# Patient Record
Sex: Female | Born: 1960 | Race: White | Hispanic: No | Marital: Married | Smoking: Former smoker
Health system: Southern US, Community
[De-identification: ages and names within clinical notes are randomized; demographics above are authoritative.]

## PROBLEM LIST (undated history)

## (undated) DIAGNOSIS — K589 Irritable bowel syndrome without diarrhea: Secondary | ICD-10-CM

## (undated) DIAGNOSIS — M47812 Spondylosis without myelopathy or radiculopathy, cervical region: Secondary | ICD-10-CM

## (undated) DIAGNOSIS — N200 Calculus of kidney: Secondary | ICD-10-CM

## (undated) DIAGNOSIS — Z9889 Other specified postprocedural states: Secondary | ICD-10-CM

## (undated) DIAGNOSIS — R112 Nausea with vomiting, unspecified: Secondary | ICD-10-CM

## (undated) DIAGNOSIS — M47816 Spondylosis without myelopathy or radiculopathy, lumbar region: Secondary | ICD-10-CM

## (undated) DIAGNOSIS — L988 Other specified disorders of the skin and subcutaneous tissue: Secondary | ICD-10-CM

## (undated) DIAGNOSIS — I10 Essential (primary) hypertension: Secondary | ICD-10-CM

## (undated) DIAGNOSIS — F329 Major depressive disorder, single episode, unspecified: Secondary | ICD-10-CM

## (undated) DIAGNOSIS — F32A Depression, unspecified: Secondary | ICD-10-CM

## (undated) DIAGNOSIS — K52832 Lymphocytic colitis: Secondary | ICD-10-CM

## (undated) DIAGNOSIS — M1990 Unspecified osteoarthritis, unspecified site: Secondary | ICD-10-CM

## (undated) DIAGNOSIS — R51 Headache: Secondary | ICD-10-CM

## (undated) DIAGNOSIS — N189 Chronic kidney disease, unspecified: Secondary | ICD-10-CM

## (undated) HISTORY — DX: Other specified disorders of the skin and subcutaneous tissue: L98.8

## (undated) HISTORY — DX: Spondylosis without myelopathy or radiculopathy, lumbar region: M47.816

## (undated) HISTORY — DX: Irritable bowel syndrome, unspecified: K58.9

## (undated) HISTORY — PX: DG MYLEOGRAM LUMBAR SPINE (ARMC HX): HXRAD1576

## (undated) HISTORY — PX: OTHER SURGICAL HISTORY: SHX169

## (undated) HISTORY — PX: CERVICAL FUSION: SHX112

## (undated) HISTORY — DX: Essential (primary) hypertension: I10

## (undated) HISTORY — DX: Unspecified osteoarthritis, unspecified site: M19.90

## (undated) HISTORY — DX: Depression, unspecified: F32.A

## (undated) HISTORY — DX: Spondylosis without myelopathy or radiculopathy, cervical region: M47.812

## (undated) HISTORY — DX: Major depressive disorder, single episode, unspecified: F32.9

## (undated) HISTORY — DX: Lymphocytic colitis: K52.832

## (undated) HISTORY — PX: TUBAL LIGATION: SHX77

## (undated) HISTORY — PX: LUMBAR DISC SURGERY: SHX700

---

## 1997-07-21 ENCOUNTER — Encounter: Admission: RE | Admit: 1997-07-21 | Discharge: 1997-10-19 | Payer: Self-pay | Admitting: Anesthesiology

## 1997-10-28 ENCOUNTER — Encounter: Admission: RE | Admit: 1997-10-28 | Discharge: 1998-01-26 | Payer: Self-pay | Admitting: Anesthesiology

## 1998-01-12 ENCOUNTER — Ambulatory Visit (HOSPITAL_COMMUNITY): Admission: RE | Admit: 1998-01-12 | Discharge: 1998-01-12 | Payer: Self-pay | Admitting: Neurological Surgery

## 1998-01-12 ENCOUNTER — Encounter: Payer: Self-pay | Admitting: Neurological Surgery

## 1998-01-23 ENCOUNTER — Encounter: Payer: Self-pay | Admitting: Neurological Surgery

## 1998-01-23 ENCOUNTER — Ambulatory Visit (HOSPITAL_COMMUNITY): Admission: RE | Admit: 1998-01-23 | Discharge: 1998-01-23 | Payer: Self-pay | Admitting: Neurological Surgery

## 1998-01-29 ENCOUNTER — Encounter: Admission: RE | Admit: 1998-01-29 | Discharge: 1998-04-17 | Payer: Self-pay | Admitting: Anesthesiology

## 1998-04-07 ENCOUNTER — Other Ambulatory Visit: Admission: RE | Admit: 1998-04-07 | Discharge: 1998-04-07 | Payer: Self-pay | Admitting: Obstetrics and Gynecology

## 1998-04-17 ENCOUNTER — Encounter: Admission: RE | Admit: 1998-04-17 | Discharge: 1998-07-16 | Payer: Self-pay | Admitting: Diagnostic Radiology

## 1998-05-20 ENCOUNTER — Encounter: Admission: RE | Admit: 1998-05-20 | Discharge: 1998-08-18 | Payer: Self-pay | Admitting: Anesthesiology

## 1998-07-20 ENCOUNTER — Encounter: Admission: RE | Admit: 1998-07-20 | Discharge: 1998-10-13 | Payer: Self-pay | Admitting: Anesthesiology

## 1998-10-13 ENCOUNTER — Encounter: Admission: RE | Admit: 1998-10-13 | Discharge: 1999-01-08 | Payer: Self-pay | Admitting: Anesthesiology

## 1998-10-29 ENCOUNTER — Encounter: Payer: Self-pay | Admitting: Neurological Surgery

## 1998-10-29 ENCOUNTER — Ambulatory Visit (HOSPITAL_COMMUNITY): Admission: RE | Admit: 1998-10-29 | Discharge: 1998-10-29 | Payer: Self-pay | Admitting: Neurological Surgery

## 1999-01-08 ENCOUNTER — Encounter: Admission: RE | Admit: 1999-01-08 | Discharge: 1999-04-08 | Payer: Self-pay | Admitting: Anesthesiology

## 1999-04-13 ENCOUNTER — Encounter: Admission: RE | Admit: 1999-04-13 | Discharge: 1999-06-30 | Payer: Self-pay | Admitting: Anesthesiology

## 1999-07-19 ENCOUNTER — Encounter: Admission: RE | Admit: 1999-07-19 | Discharge: 1999-10-17 | Payer: Self-pay | Admitting: Anesthesiology

## 1999-10-12 ENCOUNTER — Encounter: Admission: RE | Admit: 1999-10-12 | Discharge: 2000-01-10 | Payer: Self-pay | Admitting: Anesthesiology

## 1999-10-19 ENCOUNTER — Encounter: Payer: Self-pay | Admitting: Anesthesiology

## 1999-10-19 ENCOUNTER — Ambulatory Visit (HOSPITAL_COMMUNITY): Admission: RE | Admit: 1999-10-19 | Discharge: 1999-10-19 | Payer: Self-pay | Admitting: Anesthesiology

## 1999-11-10 ENCOUNTER — Encounter: Payer: Self-pay | Admitting: Emergency Medicine

## 1999-11-10 ENCOUNTER — Emergency Department (HOSPITAL_COMMUNITY): Admission: EM | Admit: 1999-11-10 | Discharge: 1999-11-10 | Payer: Self-pay | Admitting: Emergency Medicine

## 2000-01-10 ENCOUNTER — Encounter: Admission: RE | Admit: 2000-01-10 | Discharge: 2000-04-09 | Payer: Self-pay | Admitting: Anesthesiology

## 2000-04-04 ENCOUNTER — Other Ambulatory Visit: Admission: RE | Admit: 2000-04-04 | Discharge: 2000-04-04 | Payer: Self-pay | Admitting: Obstetrics and Gynecology

## 2000-04-18 ENCOUNTER — Encounter: Admission: RE | Admit: 2000-04-18 | Discharge: 2000-07-17 | Payer: Self-pay | Admitting: Anesthesiology

## 2000-04-27 ENCOUNTER — Encounter: Payer: Self-pay | Admitting: Anesthesiology

## 2000-04-27 ENCOUNTER — Ambulatory Visit (HOSPITAL_COMMUNITY): Admission: RE | Admit: 2000-04-27 | Discharge: 2000-04-27 | Payer: Self-pay | Admitting: Anesthesiology

## 2000-07-13 ENCOUNTER — Encounter: Admission: RE | Admit: 2000-07-13 | Discharge: 2000-10-07 | Payer: Self-pay | Admitting: Anesthesiology

## 2001-07-04 ENCOUNTER — Encounter: Payer: Self-pay | Admitting: Anesthesiology

## 2001-07-04 ENCOUNTER — Ambulatory Visit (HOSPITAL_COMMUNITY): Admission: RE | Admit: 2001-07-04 | Discharge: 2001-07-04 | Payer: Self-pay | Admitting: Anesthesiology

## 2002-10-11 ENCOUNTER — Encounter: Payer: Self-pay | Admitting: Anesthesiology

## 2002-10-11 ENCOUNTER — Ambulatory Visit (HOSPITAL_COMMUNITY): Admission: RE | Admit: 2002-10-11 | Discharge: 2002-10-11 | Payer: Self-pay | Admitting: Anesthesiology

## 2002-11-07 ENCOUNTER — Encounter: Admission: RE | Admit: 2002-11-07 | Discharge: 2002-11-07 | Payer: Self-pay | Admitting: Anesthesiology

## 2002-11-07 ENCOUNTER — Encounter: Payer: Self-pay | Admitting: Anesthesiology

## 2003-01-14 ENCOUNTER — Encounter: Admission: RE | Admit: 2003-01-14 | Discharge: 2003-01-14 | Payer: Self-pay | Admitting: Neurological Surgery

## 2003-02-11 HISTORY — PX: COLONOSCOPY: SHX174

## 2003-02-12 ENCOUNTER — Other Ambulatory Visit: Admission: RE | Admit: 2003-02-12 | Discharge: 2003-02-12 | Payer: Self-pay | Admitting: Obstetrics and Gynecology

## 2003-02-13 ENCOUNTER — Encounter: Admission: RE | Admit: 2003-02-13 | Discharge: 2003-02-13 | Payer: Self-pay | Admitting: Gastroenterology

## 2003-03-11 ENCOUNTER — Encounter: Admission: RE | Admit: 2003-03-11 | Discharge: 2003-04-03 | Payer: Self-pay | Admitting: Anesthesiology

## 2003-05-06 ENCOUNTER — Ambulatory Visit (HOSPITAL_COMMUNITY): Admission: RE | Admit: 2003-05-06 | Discharge: 2003-05-06 | Payer: Self-pay | Admitting: Family Medicine

## 2004-04-20 ENCOUNTER — Other Ambulatory Visit: Admission: RE | Admit: 2004-04-20 | Discharge: 2004-04-20 | Payer: Self-pay | Admitting: Obstetrics and Gynecology

## 2005-08-26 IMAGING — NM NM BONE MULTIPLE AREAS
14 series · 14 of 14 positions shown · non-contrast
Comparison: none

CLINICAL DATA: Neck and low back pain ? History of lumbar fusion.

[st statics, dual detec · 2.36mm/px · 1 of 1 slices shown (1 of 14)]
[im 1/1]
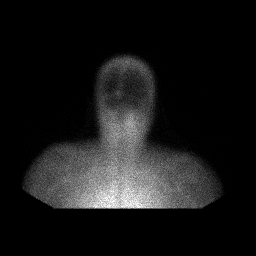

[st statics, dual detec · 2.36mm/px · 1 of 1 slices shown (2 of 14)]
[im 1/1]
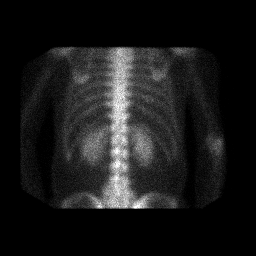

[st statics, dual detec · 2.33mm/px · 1 of 1 slices shown (3 of 14)]
[im 1/1]
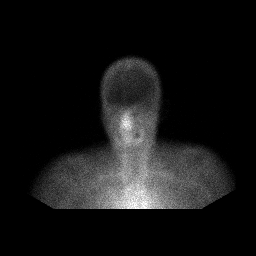

[st statics, dual detec · 2.33mm/px · 1 of 1 slices shown (4 of 14)]
[im 1/1]
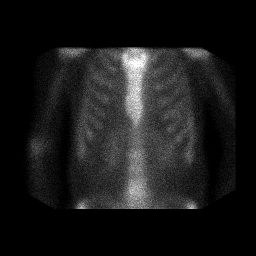

[st statics, dual detec · 2.36mm/px · 1 of 1 slices shown (5 of 14)]
[im 1/1]
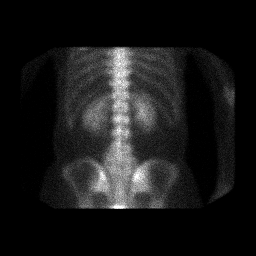

[st statics, dual detec · 2.33mm/px · 1 of 1 slices shown (6 of 14)]
[im 1/1]
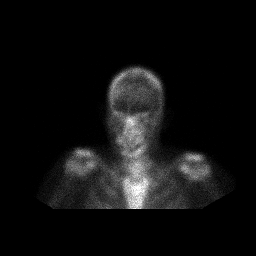

[st statics, dual detec · 2.33mm/px · 1 of 1 slices shown (7 of 14)]
[im 1/1]
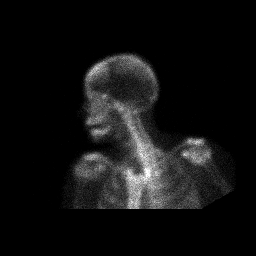

[st statics, dual detec · 2.33mm/px · 1 of 1 slices shown (8 of 14)]
[im 1/1]
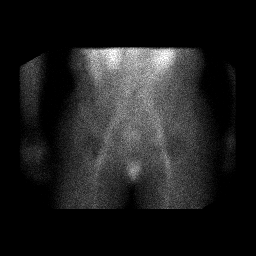

[st statics, dual detec · 2.36mm/px · 1 of 1 slices shown (9 of 14)]
[im 1/1]
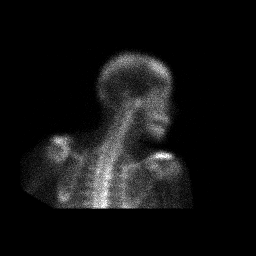

[st statics, dual detec · 2.36mm/px · 1 of 1 slices shown (10 of 14)]
[im 1/1]
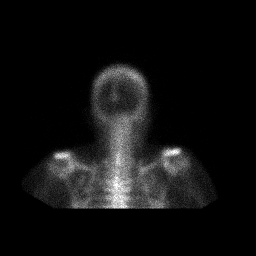

[st statics, dual detec · 2.36mm/px · 1 of 1 slices shown (11 of 14)]
[im 1/1]
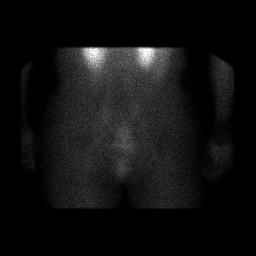

[st statics, dual detec · 2.33mm/px · 1 of 1 slices shown (12 of 14)]
[im 1/1]
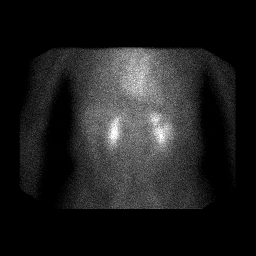

[st statics, dual detec · 2.33mm/px · 1 of 1 slices shown (13 of 14)]
[im 1/1]
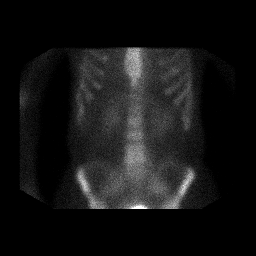

[st statics, dual detec · 2.36mm/px · 1 of 1 slices shown (14 of 14)]
[im 1/1]
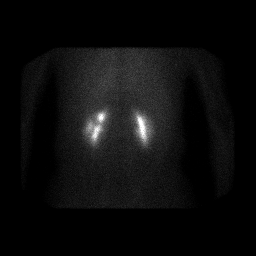

[14 of 14 positions shown; findings below may reference images not displayed]

NUCLEAR MEDICINE BONE SCAN 
 Early and delayed static images were obtained of the neck and shoulder regions and in the spine and torso region.  

 There is no abnormal uptake that would suggest significant osteoblastic reaction.  There is bilateral renal function without obstruction.

 IMPRESSION
 No pathological uptake in the cervical or lumbar regions.

 [REDACTED]

## 2006-02-02 ENCOUNTER — Ambulatory Visit (HOSPITAL_COMMUNITY): Admission: RE | Admit: 2006-02-02 | Discharge: 2006-02-02 | Payer: Self-pay | Admitting: Anesthesiology

## 2006-07-27 ENCOUNTER — Ambulatory Visit (HOSPITAL_COMMUNITY): Admission: RE | Admit: 2006-07-27 | Discharge: 2006-07-27 | Payer: Self-pay | Admitting: Anesthesiology

## 2007-07-08 ENCOUNTER — Encounter: Admission: RE | Admit: 2007-07-08 | Discharge: 2007-07-08 | Payer: Self-pay | Admitting: Orthopaedic Surgery

## 2007-12-24 ENCOUNTER — Encounter: Admission: RE | Admit: 2007-12-24 | Discharge: 2007-12-24 | Payer: Self-pay | Admitting: Orthopaedic Surgery

## 2009-08-31 ENCOUNTER — Encounter: Admission: RE | Admit: 2009-08-31 | Discharge: 2009-11-05 | Payer: Self-pay | Admitting: Orthopaedic Surgery

## 2009-12-30 ENCOUNTER — Encounter: Admission: RE | Admit: 2009-12-30 | Discharge: 2009-12-30 | Payer: Self-pay | Admitting: Obstetrics and Gynecology

## 2010-02-15 ENCOUNTER — Encounter: Payer: Self-pay | Admitting: Gastroenterology

## 2010-03-11 NOTE — Letter (Signed)
Summary: Colonoscopy Date Change Letter  [facility]  [address] [place], [place] [address]   Phone: [phone]  Fax: [phone]      February 15, 2010 MRN: [id number]   Amy Merritt [address] [facility] [address] [place], [place]  [address]   Dear Ms. CHAUCA,   Previously you were recommended to have a repeat colonoscopy around this time. Your chart was recently reviewed by Dr.Malcolm Russella Dar of [facility]. Follow up colonoscopy is now recommended in October 2014. This revised recommendation is based on current, nationally recognized guidelines for colorectal cancer screening and polyp surveillance. These guidelines are endorsed by the American Cancer Society, [employer] on Colorectal Cancer as well as numerous other major medical organizations.  Please understand that our recommendation assumes that you do not have any new symptoms such as bleeding, a change in bowel habits, anemia, or significant abdominal discomfort. If you do have any concerning GI symptoms or want to discuss the guideline recommendations, please call to arrange an office visit at your earliest convenience. Otherwise we will keep you in our reminder system and contact you 1-2 months prior to the date listed above to schedule your next colonoscopy.  Thank you,  Judie Petit T. Russella Dar, M.D.  [facility] [phone]

## 2010-06-25 NOTE — H&P (Signed)
[facility]  Patient:    Amy Merritt, Amy Merritt Visit Number: [id number] MRN: [id number]          Service Type: PMG Location: [facility] Attending Physician:  Thyra Breed Adm. Date:  [id number]   CC:         Dr. Christell Constant   History and Physical  FOLLOWUP EVALUATION  HISTORY OF PRESENT ILLNESS:  Jesiah is worked in today for followup.  She developed swelling of her lower extremities with a rash on the medial aspects of her ankles on Saturday.  She is concerned it may be coming form some of her medications.  She has reduced the amount of Duragesic she is using and as a result she is having increasing lower back discomfort.  She has no shortness of breath.  She denied fevers or sweats.  CURRENT MEDICATIONS: 1. Glucosamine complex. 2. Centrum. 3. Alprazolam 1 mg q.8-6h. 4. Tizanidine 4 mg 2 q.8h. 5. Paxil 20 mg 1 q.d. with Paxil 30 mg 1 q.d. 6. Naprosyn 500 mg 2 x q.d. 7. Neurontin 800 mg q.8h. 8. Duragesic currently with 100 mcg. 9. Roxicodone for breakthrough pain.  PHYSICAL EXAMINATION:  VITAL SIGNS:  Blood pressure 121/58, heart rate 74, respiratory rate 16.  O2 saturation 100%.  Pain level 7/10.  NEUROLOGIC:  She is tender over lower back to palpation.  She exhibits symmetric deep tendon reflexes at the knees and ankles.  Straight leg raise signs are negative.  Gait is antalgic.  She has 2+ pitting edema of the lower extremities with what appears to be perifollicular ruttish discoloration of the medial aspects of the legs, left greater than right.  She has an excoriation of both sides of her feet.  IMPRESSION: 1. Edema with rash, rule out possible leukocytoclastic vasculitis versus    contact dermatitis. 2. Low back pain on the basis of lumbar spondylosis. 3. Neck and shoulder pain on the basis of cervical spondylosis. 4. Depression. 5. Other medical problems per primary care physician.  DISPOSITION: 1. Stop Duragesic. 2. Roxicodone for  breakthrough pain. 3. OxyContin 80 mg 1 p.o. b.i.d.  She was given prescription for 60 of these.    She was aware of the side effects of this.  An alternative medication that    could be causing this could be the Paxil or the Zanaflex.  She is on    several medications which could potentially be causing this.  She is also    taking herbs.  She was encouraged to call later this week and to keep her    appointment in two weeks as previously scheduled. DD:  09/25/00 TD:  09/25/00 Job: [id number]

## 2010-06-25 NOTE — Consult Note (Signed)
[facility]  Patient:    EDISON, WOLLSCHLAGER                      MRN: [id number] Proc. Date: 12/13/99 Adm. Date:  [id number] Attending:  Thyra Breed CC:         Dr. Stefani Dama  Dr. Christell Constant, [place], [place]   Consultation Report  FOLLOWUP EVALUATION:  Delphia Grates comes in for followup evaluation of her chronic low back pain on the basis of lumbar spondylosis.  She states she is doing fairly well on her current medical regimen, with pain level 5/10.  She is back on the Ritalin in addition to her other previous medications which included Neurontin, Xanax, methadone, Percocet, Naprosyn and Paxil.  EXAMINATION  VITAL SIGNS:  Blood pressure 124/64.  Heart rate is 85.  Respiratory rate is 20.  O2 saturation is 96%.  Pain level is 5/10.  NEUROLOGIC:  Deep tendon reflexes were symmetric in the lower extremities with negative straight leg raise signs.  IMPRESSION 1. Low back pain on the basis of lumbar spondylosis. 2. Neck pain on the basis of cervical spondylosis. 3. Other medical problems per primary care physician.  DISPOSITION 1. Prescriptions were written for:    a. Percocet 5/325 mg 1 p.o. q.6h. p.r.n., #90 with no refill.    b. Neurontin 800 mg 1 p.o. q.8h., #100 with 2 refills.    c. Xanax 1 mg 1 p.o. q.6h. p.r.n., #120 with no refill.    d. Methadone 5 mg 2 p.o. q.8h., #180 with no refill.    e. Naprosyn 500 mg 1 p.o. b.i.d., #60 with 6 refills.    f. Paxil 20 mg 2 p.o. q.d., #60 with 3 refills. 2. Follow up with me in four to eight weeks. DD:  12/13/99 TD:  12/14/99 Job: [id number]

## 2010-06-25 NOTE — Procedures (Signed)
[facility]  Patient:    Amy Merritt, Amy Merritt                      MRN: [id number] Proc. Date: 03/21/00 Adm. Date:  [id number] Attending:  Thyra Breed CC:         Stefani Dama, M.D.  Monica Becton, M.D.   Procedure Report  ANESTHESIOLOGIST:  Thyra Breed, M.D.  HISTORY:  Tanija comes in for followup evaluation of her neck and lower back discomfort.  Since her last visit, for about two weeks, she has had severe right shoulder discomfort radiating up into the neck and out to the right forearm and down to her thumb.  It is exacerbated by use.  She is not feeling as good from the medications with regard to pain control.  She continues on the oxycodone but does not feel it lasts nearly six hours.  She continues on the methadone which she used previously as well as the Naprosyn, Neurontin, and Zanaflex.  She recalls a fall three to four weeks ago with her right arm extended and may have injured her shoulder at that time.  PHYSICAL EXAMINATION:  VITAL SIGNS:  Blood pressure 124/86, heart rate 70, respiratory rate 18, O2 saturation 98%, pain level 7/10.  MUSCULOSKELETAL:  The patient demonstrates a trigger point in the right levator scapulae and pain on adduction of her right shoulder from 60 to 120 degrees.  She has increased pain on resisted abduction and external rotation. Deep tendon reflexes were 2+ and symmetric in the upper and lower extremities with downgoing toes.  Motor was significant for breakthrough pain of her right upper extremity.  CURRENT MEDICATIONS: 1. Xanax 1 mg q.6h. 2. Oxycodone 15 mg q.6h. 3. Methadone 5 mg 2 p.o. q.8h. 4. Naprosyn 500 mg 1 b.i.d. 5. Ritalin 10 mg 2 q.8h. 6. Neurontin 800 mg q.8h. 7. Zanaflex 4 mg 2 p.o. q.8h. 8. Glucosamine. 9. Paxil.  DESCRIPTION OF PROCEDURE:  After informed consent was obtained, I prepped out her right shoulder and injected 3 cc of 1% lidocaine with 40 mg of Medrol via the  posterior approach.  I used a 22-gauge needle.  The patient tolerated this well and noted decrease in her pain.  I prepped out a trigger point over the right levator scapulae and injected this with 2 cc of 1% lidocaine using a 25-gauge needle after prep with Betadine x 3.  POSTPROCEDURE CONDITION:  The patient noted that her shoulder and neck discomfort were improved, but her lower back discomfort persists.  DISPOSITION: 1. Resume previous diet. 2. Limitation of activities per instruction sheet. 3. Continue on current medications. 4. Follow up with me in four weeks.  I wrote the prescriptions for methadone    5 mg 2 p.o. q.8h., #180 with no refill; Dilaudid 4 mg 1 p.o. q.4-6h.    p.r.n., #150 with no refill; Ritalin 10 mg 2 p.o. q.8h., #180 with no    refill; and Xanax 1 mg 1 p.o. q.6-8h. p.r.n., #120 with no refill.  The patient had initially presented asking about getting a myelogram of her neck, and I advised her that we needed to get her shoulder discomfort under better control before proceeding along these lines. DD:  03/21/00 TD:  03/22/00 Job: [id number]

## 2010-09-03 ENCOUNTER — Other Ambulatory Visit: Payer: Self-pay | Admitting: Obstetrics and Gynecology

## 2010-09-03 DIAGNOSIS — N6489 Other specified disorders of breast: Secondary | ICD-10-CM

## 2010-09-09 ENCOUNTER — Ambulatory Visit
Admission: RE | Admit: 2010-09-09 | Discharge: 2010-09-09 | Disposition: A | Discharge status: Home / Self Care | Payer: BC Managed Care – PPO | Source: Ambulatory Visit | Attending: Obstetrics and Gynecology | Admitting: Obstetrics and Gynecology

## 2010-09-09 DIAGNOSIS — N6489 Other specified disorders of breast: Secondary | ICD-10-CM

## 2010-11-25 ENCOUNTER — Other Ambulatory Visit: Payer: Self-pay | Admitting: Obstetrics and Gynecology

## 2010-11-25 DIAGNOSIS — Z1231 Encounter for screening mammogram for malignant neoplasm of breast: Secondary | ICD-10-CM

## 2010-12-24 ENCOUNTER — Ambulatory Visit
Admission: RE | Admit: 2010-12-24 | Discharge: 2010-12-24 | Disposition: A | Discharge status: Home / Self Care | Payer: BC Managed Care – PPO | Source: Ambulatory Visit | Attending: Obstetrics and Gynecology | Admitting: Obstetrics and Gynecology

## 2010-12-24 DIAGNOSIS — Z1231 Encounter for screening mammogram for malignant neoplasm of breast: Secondary | ICD-10-CM

## 2011-01-10 ENCOUNTER — Ambulatory Visit (HOSPITAL_COMMUNITY)
Admission: RE | Admit: 2011-01-10 | Discharge: 2011-01-10 | Disposition: A | Discharge status: Home / Self Care | Payer: BC Managed Care – PPO | Source: Ambulatory Visit | Attending: Family Medicine | Admitting: Family Medicine

## 2011-01-10 ENCOUNTER — Other Ambulatory Visit: Payer: Self-pay | Admitting: Family Medicine

## 2011-01-10 DIAGNOSIS — R1031 Right lower quadrant pain: Secondary | ICD-10-CM | POA: Insufficient documentation

## 2011-01-10 DIAGNOSIS — R319 Hematuria, unspecified: Secondary | ICD-10-CM | POA: Insufficient documentation

## 2011-01-10 DIAGNOSIS — N200 Calculus of kidney: Secondary | ICD-10-CM | POA: Insufficient documentation

## 2011-01-10 DIAGNOSIS — M545 Low back pain, unspecified: Secondary | ICD-10-CM | POA: Insufficient documentation

## 2011-01-13 ENCOUNTER — Other Ambulatory Visit: Payer: Self-pay | Admitting: Urology

## 2011-01-14 ENCOUNTER — Encounter (HOSPITAL_COMMUNITY): Payer: Self-pay | Admitting: Pharmacy Technician

## 2011-01-14 ENCOUNTER — Encounter (HOSPITAL_COMMUNITY): Payer: Self-pay | Admitting: *Deleted

## 2011-01-14 NOTE — Progress Notes (Signed)
Patient instructed to take laxative on Sunday. NPO for solids after midnight. Clear liquids until 0400 am, may take am meds as instructed with a sip. To bring insurance info ,driver,picture ID. Not to take any aspirin,ibuprofen,etc or vitamins or herbs until after the procedure. Patient to arrive in [place] at 1415 pm. Patient will need to fill out blue folder on arrival to [facility].

## 2011-01-17 ENCOUNTER — Encounter (HOSPITAL_COMMUNITY): Payer: Self-pay | Admitting: *Deleted

## 2011-01-17 ENCOUNTER — Ambulatory Visit (HOSPITAL_COMMUNITY)
Admission: RE | Admit: 2011-01-17 | Discharge: 2011-01-17 | Disposition: A | Discharge status: Home / Self Care | Payer: BC Managed Care – PPO | Source: Ambulatory Visit | Attending: Urology | Admitting: Urology

## 2011-01-17 ENCOUNTER — Other Ambulatory Visit (HOSPITAL_COMMUNITY): Payer: Self-pay | Admitting: *Deleted

## 2011-01-17 ENCOUNTER — Ambulatory Visit (HOSPITAL_COMMUNITY): Payer: BC Managed Care – PPO

## 2011-01-17 ENCOUNTER — Encounter (HOSPITAL_COMMUNITY)
Admission: RE | Disposition: A | Discharge status: Home / Self Care | Payer: Self-pay | Source: Ambulatory Visit | Attending: Urology

## 2011-01-17 DIAGNOSIS — E7800 Pure hypercholesterolemia, unspecified: Secondary | ICD-10-CM | POA: Insufficient documentation

## 2011-01-17 DIAGNOSIS — F329 Major depressive disorder, single episode, unspecified: Secondary | ICD-10-CM | POA: Insufficient documentation

## 2011-01-17 DIAGNOSIS — R300 Dysuria: Secondary | ICD-10-CM | POA: Insufficient documentation

## 2011-01-17 DIAGNOSIS — M549 Dorsalgia, unspecified: Secondary | ICD-10-CM | POA: Insufficient documentation

## 2011-01-17 DIAGNOSIS — Z79899 Other long term (current) drug therapy: Secondary | ICD-10-CM | POA: Insufficient documentation

## 2011-01-17 DIAGNOSIS — R82998 Other abnormal findings in urine: Secondary | ICD-10-CM | POA: Insufficient documentation

## 2011-01-17 DIAGNOSIS — F3289 Other specified depressive episodes: Secondary | ICD-10-CM | POA: Insufficient documentation

## 2011-01-17 DIAGNOSIS — N1330 Unspecified hydronephrosis: Secondary | ICD-10-CM | POA: Insufficient documentation

## 2011-01-17 DIAGNOSIS — N200 Calculus of kidney: Secondary | ICD-10-CM | POA: Insufficient documentation

## 2011-01-17 DIAGNOSIS — R1031 Right lower quadrant pain: Secondary | ICD-10-CM | POA: Insufficient documentation

## 2011-01-17 DIAGNOSIS — Z8739 Personal history of other diseases of the musculoskeletal system and connective tissue: Secondary | ICD-10-CM | POA: Insufficient documentation

## 2011-01-17 HISTORY — DX: Unspecified osteoarthritis, unspecified site: M19.90

## 2011-01-17 HISTORY — DX: Other specified postprocedural states: Z98.890

## 2011-01-17 HISTORY — DX: Nausea with vomiting, unspecified: R11.2

## 2011-01-17 HISTORY — DX: Chronic kidney disease, unspecified: N18.9

## 2011-01-17 HISTORY — DX: Headache: R51

## 2011-01-17 LAB — URINALYSIS, ROUTINE W REFLEX MICROSCOPIC
Bilirubin Urine: NEGATIVE
Glucose, UA: NEGATIVE mg/dL
Ketones, ur: NEGATIVE mg/dL
Protein, ur: NEGATIVE mg/dL
pH: 7.5 (ref 5.0–8.0)

## 2011-01-17 LAB — URINE MICROSCOPIC-ADD ON

## 2011-01-17 SURGERY — LITHOTRIPSY, ESWL
Anesthesia: LOCAL | Laterality: Left

## 2011-01-17 MED ORDER — DIAZEPAM 5 MG PO TABS
ORAL_TABLET | ORAL | Status: AC
  Administered 2011-01-17: 10 mg via ORAL
  Filled 2011-01-17: qty 2

## 2011-01-17 MED ORDER — NAPROXEN 500 MG PO TABS: 500.0000 mg | ORAL_TABLET | Freq: Two times a day (BID) | ORAL | Status: DC

## 2011-01-17 MED ORDER — DIAZEPAM 5 MG PO TABS
10.0000 mg | ORAL_TABLET | ORAL | Status: AC
  Administered 2011-01-17: 10 mg via ORAL

## 2011-01-17 MED ORDER — CIPROFLOXACIN HCL 500 MG PO TABS
500.0000 mg | ORAL_TABLET | ORAL | Status: AC
  Administered 2011-01-17: 500 mg via ORAL

## 2011-01-17 MED ORDER — DEXTROSE-NACL 5-0.45 % IV SOLN
INTRAVENOUS | Status: DC
  Administered 2011-01-17: 15:00:00 via INTRAVENOUS

## 2011-01-17 MED ORDER — DIPHENHYDRAMINE HCL 25 MG PO CAPS
ORAL_CAPSULE | ORAL | Status: AC
  Administered 2011-01-17: 25 mg via ORAL
  Filled 2011-01-17: qty 1

## 2011-01-17 MED ORDER — CIPROFLOXACIN HCL 500 MG PO TABS
ORAL_TABLET | ORAL | Status: AC
  Administered 2011-01-17: 500 mg via ORAL
  Filled 2011-01-17: qty 1

## 2011-01-17 MED ORDER — HYDROMORPHONE HCL PF 1 MG/ML IJ SOLN: 1.0000 mg | INTRAMUSCULAR | Status: DC | PRN

## 2011-01-17 MED ORDER — DIPHENHYDRAMINE HCL 25 MG PO CAPS
25.0000 mg | ORAL_CAPSULE | ORAL | Status: AC
  Administered 2011-01-17: 25 mg via ORAL

## 2011-01-17 NOTE — H&P (Signed)
History of Present Illness              New patient referred for nephrolithiasis. She developed RIGHT lower back pain. Pain started about 2 weeks ago. She has a long history pain. She's had 4 back surgeries. She is on fentanyl patch, Oxycodone, and Naproxyn. It radiates around to RLQ. Pain varies and is a dull ache. It is intermittent. Pain is 8/10. She has no known history of kidney stones. She has no hematuria. She has no frequency (she usually voids greater than evry 2 hrs). Some urgency. She had mild dysuria for past few weeks. She had a bladder infection 2-3 mo ago. Treated with with abx. She said she's had some recent "heart issues" but a normal EKG. She said she may need a "24-hr" test. She thinks she is on Bactrim.  CT A/P Jan 10, 2011 revealed a 9 x 12 mm left UPJ stone with moderate hydronephrosis and a 5 mm LLP stone - I reviewed all images.Stone not visible on scout.     Past Medical History Problems  1. History of  Arthritis V13.4 2. History of  Depression 311 3. History of  Hypercholesterolemia 272.0  Surgical History Problems  1. History of  Back Surgery 2. History of  Back Surgery 3. History of  Back Surgery 4. History of  Tubal Ligation V25.2  Current Meds 1. Effexor XR 75 MG Oral Capsule Extended Release 24 Hour; Therapy: (Recorded:06Dec2012) to 2. FentaNYL 100 MCG/HR Transdermal Patch 72 Hour; Therapy: (Recorded:06Dec2012) to 3. Gabapentin TABS; Therapy: (Recorded:06Dec2012) to 4. Glucosamine CAPS; Therapy: (Recorded:06Dec2012) to 5. Naprosyn TABS; Therapy: (Recorded:06Dec2012) to 6. OxyCODONE HCl 30 MG Oral Tablet; Therapy: (Recorded:06Dec2012) to 7. Xanax TABS; Therapy: (Recorded:06Dec2012) to  Allergies Medication  1. No Known Drug Allergies  Family History Problems  1. Sororal history of  Death In The Family Father 2. Sororal history of  Death In The Family Mother 3. Sororal history of  Family Health Status Number Of Children 2 sons 4. Sororal history of   Nephrolithiasis  Social History Problems    Caffeine Use 1-2 per day   Former Smoker V15.82 smoked 1ppd for 29yrs quit 65yrs ago   Marital History - Currently Married Denied    History of  Alcohol Use  Review of Systems Genitourinary, constitutional, skin, eye, otolaryngeal, hematologic/lymphatic, cardiovascular, pulmonary, endocrine, musculoskeletal, gastrointestinal, neurological and psychiatric system(s) were reviewed and pertinent findings if present are noted.  Gastrointestinal: abdominal pain and constipation.  Constitutional: fever and feeling tired (fatigue).  Integumentary: skin rash/lesion and pruritus.  Eyes: blurred vision.  ENT: sinus problems.  Cardiovascular: leg swelling.  Endocrine: polydipsia.  Musculoskeletal: back pain and joint pain.  Neurological: headache.  Psychiatric: depression and anxiety.    Vitals Vital Signs [Data Includes: Last 1 Day]  06Dec2012 11:01AM  BMI Calculated: 30.23 BSA Calculated: 1.67 Height: 5 ft  Weight: 154 lb  Blood Pressure: 138 / 88 Temperature: 97.8 F Heart Rate: 79  Physical Exam Constitutional: Well nourished and well developed . No acute distress.  ENT:. The ears and nose are normal in appearance.  Neck: The appearance of the neck is normal and no neck mass is present.  Pulmonary: No respiratory distress and normal respiratory rhythm and effort.  Cardiovascular: Heart rate and rhythm are normal . No peripheral edema.  Abdomen: The abdomen is soft and nontender. No masses are palpated. No CVA tenderness. No hernias are palpable. No hepatosplenomegaly noted.  Genitourinary: Examination of the external genitalia shows normal female external  genitalia and no lesions. The urethra is normal in appearance and not tender. There is no urethral mass. Vaginal exam demonstrates no abnormalities. The adnexa are palpably normal. The bladder is non tender and not distended. The anus is normal on inspection. The perineum is normal on  inspection.  Lymphatics: The femoral and inguinal nodes are not enlarged or tender.  Skin: Normal skin turgor, no visible rash and no visible skin lesions.  Neuro/Psych:. Mood and affect are appropriate.    Results/Data Urine [Data Includes: Last 1 Day]   06Dec2012  COLOR YELLOW   APPEARANCE CLOUDY   SPECIFIC GRAVITY <1.005   pH 5.5   GLUCOSE NEG mg/dL  BILIRUBIN NEG   KETONE NEG mg/dL  BLOOD TRACE   PROTEIN NEG mg/dL  UROBILINOGEN 0.2 mg/dL  NITRITE NEG   LEUKOCYTE ESTERASE LARGE   SQUAMOUS EPITHELIAL/HPF RARE   WBC 21-50 WBC/hpf  RBC 4-6 RBC/hpf  BACTERIA MODERATE   CRYSTALS NONE SEEN   CASTS NONE SEEN    Old records or history reviewed: 5 pages.    Assessment Assessed  1. Health Maintenance V70.0 2. Nephrolithiasis 592.0 3. Hydronephrosis 591 4. Pyuria 791.9  Plan  Health Maintenance (V70.0)  1. UA With REFLEX  Done: 06Dec2012 10:45AM Nephrolithiasis (592.0)  2. KUB  Done: 06Dec2012 12:00AM Nephrolithiasis (592.0), Hydronephrosis (591)  3. Follow-up Schedule Surgery Office  Follow-up  Requested for: 06Dec2012 Nephrolithiasis (592.0), Pyuria (791.9)  4. Ciprofloxacin HCl 250 MG Oral Tablet; One tab po BID x three days then One tab daily; Therapy:  06Dec2012 to (Evaluate:27Dec2012)  Requested for: 06Dec2012; Last Rx:06Dec2012; Edited  URINE CULTURE  Status: In Progress - Specimen/Data Collected  Done: 06Dec2012 Ordered Today; For: Pyuria (791.9); Ordered By: Jamie Kato  Due: 08Dec2012 Marked Important; Last Updated By: Netty Starring   Discussion/Summary     Urine sent for culture. Start on Cipro after Bactrim. By history he pain is not related to the left kidney. However, it is a large stone and she has hydronephrosis and needs treatment. We discussed her left stones the nature, risks , benefits of doing nothing, ESWL, cysto/stent/URS/laser litho, or PCNL. All questions answered. She elects to proceed with ESWL. We discussed risks of  failure to fragment, obstruction, bleeding and infection among others. She will need General Anesthesia given her pain medication requirements. We discussed we would likely not be able to treat the small LLP stone.  Pt cleared from PCP for surgery. Pt urine cx with 25k E coli sensitive to Cipro which patient was prescribed last week. Given low colony count and appropriate abx feel it is OK to proceed.

## 2011-01-17 NOTE — Progress Notes (Signed)
Pt here for lithotripsy. Denies any ibuprofen,aspirin or toradol in las 72 hours. Took laxative last night and enema this AM with fair results.

## 2011-01-17 NOTE — Brief Op Note (Addendum)
01/17/2011  6:03 PM  PATIENT:  Amy Merritt  50 y.o. female  PRE-OPERATIVE DIAGNOSIS:  left nephrolithiasis  POST-OPERATIVE DIAGNOSIS:  Left nephrolithiasis  PROCEDURE:  Procedure(s): LEFT EXTRACORPOREAL SHOCK WAVE LITHOTRIPSY (ESWL)  SURGEON:  Surgeon(s): Antony Haste, MD  PHYSICIAN ASSISTANT:   ASSISTANTS: none   ANESTHESIA:   IV sedation  EBL:     BLOOD ADMINISTERED:none  DRAINS: none   LOCAL MEDICATIONS USED:  NONE  SPECIMEN:  No Specimen  DISPOSITION OF SPECIMEN:  N/A  DICTATION: see scanned op note  PLAN OF CARE: Discharge to home after PACU  PATIENT DISPOSITION:  PACU - hemodynamically stable.   Delay start of Pharmacological VTE agent (>24hrs) due to surgical blood loss or risk of bleeding:  {YES/NO/NOT APPLICABLE:20182

## 2011-01-17 NOTE — Progress Notes (Signed)
Beeped Dr Mena Goes about medication for pain. Pt has received her Valium and Bendaryl but is still hurting and took last pain medication at 1100 this AM. He would like pt to wait for 1615 lithotripsy to medicate patient. Pt understands this and warm blanket applied to back for comfort.

## 2011-01-17 NOTE — Progress Notes (Signed)
Pt returned from lithotripsy at 1810. VSS. Resting in recliner comfortably.

## 2011-08-08 ENCOUNTER — Encounter: Payer: Self-pay | Admitting: Gastroenterology

## 2011-09-13 ENCOUNTER — Encounter: Payer: Self-pay | Admitting: Gastroenterology

## 2011-09-13 ENCOUNTER — Ambulatory Visit (INDEPENDENT_AMBULATORY_CARE_PROVIDER_SITE_OTHER): Payer: BC Managed Care – PPO | Admitting: Gastroenterology

## 2011-09-13 VITALS — BP 120/84 | HR 84 | Ht 61.0 in | Wt 152.4 lb

## 2011-09-13 DIAGNOSIS — R1013 Epigastric pain: Secondary | ICD-10-CM

## 2011-09-13 DIAGNOSIS — R112 Nausea with vomiting, unspecified: Secondary | ICD-10-CM

## 2011-09-13 DIAGNOSIS — K219 Gastro-esophageal reflux disease without esophagitis: Secondary | ICD-10-CM

## 2011-09-13 DIAGNOSIS — R197 Diarrhea, unspecified: Secondary | ICD-10-CM

## 2011-09-13 DIAGNOSIS — K52832 Lymphocytic colitis: Secondary | ICD-10-CM

## 2011-09-13 DIAGNOSIS — K5289 Other specified noninfective gastroenteritis and colitis: Secondary | ICD-10-CM

## 2011-09-13 MED ORDER — HYOSCYAMINE SULFATE 0.125 MG SL SUBL: SUBLINGUAL_TABLET | SUBLINGUAL | Status: DC

## 2011-09-13 MED ORDER — PANTOPRAZOLE SODIUM 40 MG PO TBEC: 40.0000 mg | DELAYED_RELEASE_TABLET | Freq: Every day | ORAL | Status: DC

## 2011-09-13 NOTE — Progress Notes (Signed)
History of Present Illness: This is a 51 year old female here today with her husband. I saw her in 2005 for irritable bowel syndrome and lymphocytic colitis. For the past 6 months she relates problems with postprandial epigastric pain accompanied by intermittent nausea and vomiting. She notes regurgitation after meals. She has urgent watery nonbloody diarrhea following meals as well. Naprosyn was discontinued several weeks ago and her epigastric pain and vomiting have improved substantially. Her husband relates that she is still on a limited diet and her diarrhea has not changed. She had side effects from Asacol that was used to treat lymphocytic colitis. She tolerated balsalazide. She has chronic back pain managed with a Duragesic patch, Neurontin, Robaxin, oxycodone and ibuprofen famotidine combination was started with Naprosyn was stopped. Denies weight loss, constipation, change in stool caliber, melena, hematochezia, dysphagia, chest pain.  Review of Systems: Pertinent positive and negative review of systems were noted in the above HPI section. All other review of systems were otherwise negative.  Current Medications, Allergies, Past Medical History, Past Surgical History, Family History and Social History were reviewed in [employer] record.  Physical Exam: General: Well developed , well nourished, no acute distress Head: Normocephalic and atraumatic Eyes:  sclerae anicteric, EOMI Ears: Normal auditory acuity Mouth: No deformity or lesions Neck: Supple, no masses or thyromegaly Lungs: Clear throughout to auscultation Heart: Regular rate and rhythm; no murmurs, rubs or bruits Abdomen: Soft, mild epigastric tenderness to deep palpation without rebound or guarding and non distended. No masses, hepatosplenomegaly or hernias noted. Normal Bowel sounds Musculoskeletal: Symmetrical with no gross deformities  Skin: No lesions on visible extremities Pulses:  Normal pulses  noted Extremities: No clubbing, cyanosis, edema or deformities noted Neurological: Alert oriented x 4, grossly nonfocal Cervical Nodes:  No significant cervical adenopathy Inguinal Nodes: No significant inguinal adenopathy Psychological:  Alert and cooperative. Normal mood and affect  Assessment and Recommendations:  1. GERD, epigastric pain, regurgitation, nausea and vomiting. Rule out ulcer disease, gastritis, partial outlet obstruction and gastroparesis. Her GI symptoms may be driven by her medications to control pain. Begin pantoprazole 40 mg daily and standard antireflux measures. The risks, benefits, and alternatives to endoscopy with possible biopsy and possible dilation were discussed with the patient and they consent to proceed.   2. Postprandial diarrhea. History of lymphocytic colitis. She may be having a flare of lymphocytic colitis. Will try Levsin for possible irritable bowel syndrome and if this is not effective begin therapy for lymphocytic colitis.

## 2011-09-13 NOTE — Patient Instructions (Addendum)
You have been scheduled for an endoscopy with propofol. Please follow written instructions given to you at your visit today. If you use inhalers (even only as needed), please bring them with you on the day of your procedure.  We have sent the following medications to your pharmacy for you to pick up at your convenience: Pantoprazole, Levsin.  Patient advised to avoid spicy, acidic, citrus, chocolate, mints, fruit and fruit juices.  Limit the intake of caffeine, alcohol and Soda.  Don't exercise too soon after eating.  Don't lie down within 3-4 hours of eating.  Elevate the head of your bed.   cc: Rudi Heap, MD        Thyra Breed, MD

## 2011-10-10 ENCOUNTER — Telehealth: Payer: Self-pay | Admitting: Internal Medicine

## 2011-10-10 NOTE — Telephone Encounter (Signed)
EGD instructions re: diet, NPO after 1230, clear liquids only after midnight, need driver reviewed with patient.

## 2011-10-11 ENCOUNTER — Ambulatory Visit (AMBULATORY_SURGERY_CENTER): Payer: BC Managed Care – PPO | Admitting: Gastroenterology

## 2011-10-11 ENCOUNTER — Encounter: Payer: Self-pay | Admitting: Gastroenterology

## 2011-10-11 VITALS — BP 115/65 | HR 80 | Temp 99.1°F | Resp 12 | Ht 61.0 in | Wt 152.0 lb

## 2011-10-11 DIAGNOSIS — K2980 Duodenitis without bleeding: Secondary | ICD-10-CM

## 2011-10-11 DIAGNOSIS — K219 Gastro-esophageal reflux disease without esophagitis: Secondary | ICD-10-CM

## 2011-10-11 DIAGNOSIS — K2970 Gastritis, unspecified, without bleeding: Secondary | ICD-10-CM

## 2011-10-11 DIAGNOSIS — K315 Obstruction of duodenum: Secondary | ICD-10-CM

## 2011-10-11 DIAGNOSIS — K2990 Gastroduodenitis, unspecified, without bleeding: Secondary | ICD-10-CM

## 2011-10-11 DIAGNOSIS — R1013 Epigastric pain: Secondary | ICD-10-CM

## 2011-10-11 DIAGNOSIS — R112 Nausea with vomiting, unspecified: Secondary | ICD-10-CM

## 2011-10-11 MED ORDER — SODIUM CHLORIDE 0.9 % IV SOLN: 500.0000 mL | INTRAVENOUS | Status: DC

## 2011-10-11 NOTE — Op Note (Signed)
[facility] [address] [place], [address]   ENDOSCOPY PROCEDURE REPORT  PATIENT: Amy, Merritt  MR#: [id number] BIRTHDATE: [birth year] , 50  yrs. old GENDER: Female ENDOSCOPIST: Meryl Dare, MD, [facility]  PROCEDURE DATE:  10/11/2011 PROCEDURE:  EGD w/ biopsy ASA CLASS:     Class II INDICATIONS:  history of esophageal reflux. MEDICATIONS: propofol (Diprivan) 150mg  IV TOPICAL ANESTHETIC: Cetacaine Spray DESCRIPTION OF PROCEDURE: After the risks benefits and alternatives of the procedure were thoroughly explained, informed consent was obtained.  The [facility] [id number] endoscope was introduced through the mouth and advanced to the second portion of the duodenum. Without limitations.  The instrument was slowly withdrawn as the mucosa was fully examined.   STOMACH: Gastritis (inflammation) was found in the gastric antrum. Multiple biopsies were performed using cold forceps.   The stomach otherwise appeared normal. DUODENUM: A moderate, benign appearing stenosis was found in the in the D1/D2 junction.  Multiple biopsies were performed. ESOPHAGUS: The mucosa of the esophagus appeared normal.  Retroflexed views revealed no abnormalities.     The scope was then withdrawn from the patient and the procedure completed.  COMPLICATIONS: There were no complications.  ENDOSCOPIC IMPRESSION: 1.   Gastritis (inflammation) was found in the gastric antrum; multiple biopsies 2.   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  anti-reflux regimen 2.  await pathology results 3.  continue PPI    eSigned:  Meryl Dare, MD, Clementeen Graham 10/11/2011 3:30 PM   [id number] Christell Constant, MD

## 2011-10-11 NOTE — Patient Instructions (Addendum)

## 2011-10-11 NOTE — Progress Notes (Signed)
Patient did not have preoperative order for IV antibiotic SSI prophylaxis. (G8918)  Patient did not experience any of the following events: a burn prior to discharge; a fall within the facility; wrong site/side/patient/procedure/implant event; or a hospital transfer or hospital admission upon discharge from the facility. (G8907)  

## 2011-10-11 NOTE — Progress Notes (Signed)
PT. C/O Back pain and moaning.

## 2011-10-11 NOTE — Progress Notes (Signed)
Pt in admitting area moaning and complaining with back pain. Pt states she has excruciating lower back pain today, pt almost crying out in  Pain. Pt states she has her pain patch on and has had po pain meds today as well with little to no relief. Pt has back surgery scheduled . ewm

## 2011-10-12 ENCOUNTER — Telehealth: Payer: Self-pay | Admitting: *Deleted

## 2011-10-12 NOTE — Telephone Encounter (Signed)
  Follow up Call-  Call back number 10/11/2011  Post procedure Call Back phone  # [phone]  Permission to leave phone message Yes     Patient questions:  Do you have a fever, pain , or abdominal swelling? no Pain Score  0 *  Have you tolerated food without any problems? yes  Have you been able to return to your normal activities? yes  Do you have any questions about your discharge instructions: Diet   no Medications  no Follow up visit  no  Do you have questions or concerns about your Care? no  Actions: * If pain score is 4 or above: No action needed, pain <4.

## 2011-10-17 ENCOUNTER — Encounter: Payer: Self-pay | Admitting: Gastroenterology

## 2011-10-21 ENCOUNTER — Telehealth: Payer: Self-pay | Admitting: Gastroenterology

## 2011-10-21 NOTE — Telephone Encounter (Signed)
All questions answered about EGD results.  She is asked to call back for any further questions

## 2011-10-31 ENCOUNTER — Other Ambulatory Visit: Payer: Self-pay | Admitting: *Deleted

## 2011-10-31 DIAGNOSIS — R112 Nausea with vomiting, unspecified: Secondary | ICD-10-CM

## 2011-10-31 DIAGNOSIS — R1013 Epigastric pain: Secondary | ICD-10-CM

## 2011-10-31 DIAGNOSIS — K219 Gastro-esophageal reflux disease without esophagitis: Secondary | ICD-10-CM

## 2011-10-31 MED ORDER — PANTOPRAZOLE SODIUM 40 MG PO TBEC: 40.0000 mg | DELAYED_RELEASE_TABLET | Freq: Every day | ORAL | Status: DC

## 2011-10-31 NOTE — Telephone Encounter (Signed)
New rx sent to [facility]. Rx sent to [facility] for #30 with 11 refills was discontinued with Tammy @ [facility].

## 2012-05-16 ENCOUNTER — Telehealth: Payer: Self-pay | Admitting: Nurse Practitioner

## 2012-05-16 NOTE — Telephone Encounter (Signed)
Biaterall lower extremity swelling x 3 days with development of a red, patchy rash 2 days ago.  She has rested with her legs elevated and that has helped the swelling. BP was 160/98 last night.  She is not on any antihypertensives.  Has family hx of heart disease.  Denies SOB.  Pain Management physician suggested she see PCP for swelling and rash.   Appt scheduled for Monday 05/21/12.  Patient is to call 911 or go to ER if she develops any shortness of breath or increase in LE swelling.  Continue rest and elevation.  Patient states understanding and agrees to plan.

## 2012-05-21 ENCOUNTER — Encounter: Payer: Self-pay | Admitting: Family Medicine

## 2012-05-21 ENCOUNTER — Ambulatory Visit (INDEPENDENT_AMBULATORY_CARE_PROVIDER_SITE_OTHER): Payer: BC Managed Care – PPO | Admitting: Family Medicine

## 2012-05-21 VITALS — BP 140/75 | HR 66 | Temp 98.1°F | Ht 62.0 in | Wt 156.4 lb

## 2012-05-21 DIAGNOSIS — E785 Hyperlipidemia, unspecified: Secondary | ICD-10-CM | POA: Insufficient documentation

## 2012-05-21 DIAGNOSIS — G4700 Insomnia, unspecified: Secondary | ICD-10-CM | POA: Insufficient documentation

## 2012-05-21 DIAGNOSIS — F988 Other specified behavioral and emotional disorders with onset usually occurring in childhood and adolescence: Secondary | ICD-10-CM

## 2012-05-21 DIAGNOSIS — R600 Localized edema: Secondary | ICD-10-CM | POA: Insufficient documentation

## 2012-05-21 DIAGNOSIS — R609 Edema, unspecified: Secondary | ICD-10-CM

## 2012-05-21 DIAGNOSIS — G8929 Other chronic pain: Secondary | ICD-10-CM | POA: Insufficient documentation

## 2012-05-21 DIAGNOSIS — I10 Essential (primary) hypertension: Secondary | ICD-10-CM | POA: Insufficient documentation

## 2012-05-21 DIAGNOSIS — F3289 Other specified depressive episodes: Secondary | ICD-10-CM

## 2012-05-21 DIAGNOSIS — F329 Major depressive disorder, single episode, unspecified: Secondary | ICD-10-CM | POA: Insufficient documentation

## 2012-05-21 DIAGNOSIS — F32A Depression, unspecified: Secondary | ICD-10-CM | POA: Insufficient documentation

## 2012-05-21 DIAGNOSIS — M549 Dorsalgia, unspecified: Secondary | ICD-10-CM

## 2012-05-21 LAB — POCT CBC
Granulocyte percent: 61.5 %G (ref 37–80)
HCT, POC: 35.2 % — AB (ref 37.7–47.9)
Hemoglobin: 11.4 g/dL — AB (ref 12.2–16.2)
Lymph, poc: 1.5 (ref 0.6–3.4)
MCH, POC: 25.9 pg — AB (ref 27–31.2)
MCHC: 32.4 g/dL (ref 31.8–35.4)
MCV: 79.8 fL — AB (ref 80–97)
MPV: 6.7 fL (ref 0–99.8)
POC Granulocyte: 3.3 (ref 2–6.9)
POC LYMPH PERCENT: 28.8 %L (ref 10–50)
Platelet Count, POC: 291 10*3/uL (ref 142–424)
RBC: 4.4 M/uL (ref 4.04–5.48)
RDW, POC: 16.8 %
WBC: 5.3 10*3/uL (ref 4.6–10.2)

## 2012-05-21 LAB — COMPLETE METABOLIC PANEL WITH GFR
ALT: 14 U/L (ref 0–35)
AST: 17 U/L (ref 0–37)
Albumin: 4 g/dL (ref 3.5–5.2)
Alkaline Phosphatase: 75 U/L (ref 39–117)
BUN: 12 mg/dL (ref 6–23)
CO2: 26 mEq/L (ref 19–32)
Calcium: 9.1 mg/dL (ref 8.4–10.5)
Chloride: 102 mEq/L (ref 96–112)
Creat: 0.93 mg/dL (ref 0.50–1.10)
GFR, Est African American: 83 mL/min
GFR, Est Non African American: 72 mL/min
Glucose, Bld: 83 mg/dL (ref 70–99)
Potassium: 5.1 mEq/L (ref 3.5–5.3)
Sodium: 136 mEq/L (ref 135–145)
Total Bilirubin: 0.3 mg/dL (ref 0.3–1.2)
Total Protein: 6.9 g/dL (ref 6.0–8.3)

## 2012-05-21 LAB — LIPID PANEL
Cholesterol: 198 mg/dL (ref 0–200)
HDL: 57 mg/dL (ref >39)
LDL Cholesterol: 119 mg/dL — ABNORMAL HIGH (ref 0–99)
Total CHOL/HDL Ratio: 3.5 Ratio
Triglycerides: 108 mg/dL (ref <150)
VLDL: 22 mg/dL (ref 0–40)

## 2012-05-21 MED ORDER — CHLORTHALIDONE 25 MG PO TABS: 25.0000 mg | ORAL_TABLET | Freq: Every day | ORAL | Status: DC

## 2012-05-21 NOTE — Patient Instructions (Addendum)
      Dr Arman Loy's Recommendations  Diet and Exercise discussed with patient.  For nutrition information, I recommend books:  1).Eat to Live by Dr Joel Fuhrman. 2).Prevent and Reverse Heart Disease by Dr Caldwell Esselstyn.  Exercise recommendations are:  If unable to walk, then the patient can exercise in a chair 3 times a day. By flapping arms like a bird gently and raising legs outwards to the front.  If ambulatory, the patient can go for walks for 30 minutes 3 times a week. Then increase the intensity and duration as tolerated.  Goal is to try to attain exercise frequency to 5 times a week.  If applicable: Best to perform resistance exercises (machines or weights) 2 days a week and cardio type exercises 3 days per week.  

## 2012-05-21 NOTE — Progress Notes (Signed)
Quick Note:  Labs abnormal. Mild microcytic anemia. May be due to low iron. Will need her to take a multivitamin with iron. And will recheck at follow up visit.  ______

## 2012-05-21 NOTE — Progress Notes (Signed)
Patient ID: Amy Merritt, female   DOB: December 08, 1960, 52 y.o.   MRN: [id number] SUBJECTIVE: HPI: Patient is here for follow up of hypertension / pedal edema/ hyperlipidemia: denies Headache;deniesChest Pain;denies weakness;denies Shortness of Breath or Orthopnea;denies Visual changes;denies palpitations;denies cough;admits to pedal edema;denies symptoms of TIA or stroke; admits to Compliance with medications. denies Problems with medications. Pedal edema improves at night, gets worse in the daytime, and when she is standing. Worse with warmer weather.  PMH/PSH: reviewed/updated in Epic  SH/FH: reviewed/updated in Epic  Allergies: reviewed/updated in Epic  Medications: reviewed/updated in Epic  Immunizations: reviewed/updated in Epic  ROS: As above in the HPI. All other systems are stable or negative.  OBJECTIVE: APPEARANCE:  White female, NAD Patient in no acute distress.The patient appeared well nourished and normally developed. Acyanotic. Waist: VITAL SIGNS:BP 140/75  Pulse 66  Temp(Src) 98.1 F (36.7 C) (Oral)  Ht 5\' 2"  (1.575 m)  Wt 156 lb 6.4 oz (70.943 kg)  BMI 28.6 kg/m2  SpO2 97%  LMP 04/20/2012   SKIN: warm and  Dry without overt rashes, tattoos HEAD and Neck: without JVD, Head and scalp: normal Eyes:No scleral icterus. Fundi normal, eye movements normal. Ears: Auricle normal, canal normal, Tympanic membranes normal, insufflation normal. Nose: normal Throat: normal Neck & thyroid: normal  CHEST & LUNGS: Chest wall: normal Lungs: Clear  [facility]: Reveals the PMI to be normally located. Regular rhythm, First and Second Heart sounds are normal,  absence of murmurs, rubs or gallops. Peripheral vasculature: Radial pulses: normal Dorsal pedis pulses: normal Posterior pulses: normal  ABDOMEN:  Appearance: normal Benign,, no organomegaly, no masses, no Abdominal Aortic enlargement. No Guarding , no rebound. No Bruits. Bowel sounds: normal  RECTAL: N/A GU:  N/A  EXTREMETIES: nonedematous. Both Femoral and Pedal pulses are normal.  MUSCULOSKELETAL:  Spine: abnormal with surgical scar of lumbar spine and   NEUROLOGIC: oriented to time,place and person; nonfocal.   ASSESSMENT: Pedal edema - Plan: COMPLETE METABOLIC PANEL WITH GFR, POCT CBC  HTN (hypertension) - Plan: COMPLETE METABOLIC PANEL WITH GFR, chlorthalidone (HYGROTON) 25 MG tablet  Back pain, chronic  ADD (attention deficit disorder)  HLD (hyperlipidemia) - Plan: Lipid panel  Depression  Insomnia  Will treat with a diuretic for elevated BP and may help the edema a little bit  PLAN: Rx given for medium range pressures knee high compression stockings. Patient to purchase at [facility].      Dr Woodroe Mode Recommendations  Diet and Exercise discussed with patient.  For nutrition information, I recommend books:  1).Eat to Live by Dr Monico Hoar. 2).Prevent and Reverse Heart Disease by Dr Suzzette Righter.  Exercise recommendations are:  If unable to walk, then the patient can exercise in a chair 3 times a day. By flapping arms like a bird gently and raising legs outwards to the front.  If ambulatory, the patient can go for walks for 30 minutes 3 times a week. Then increase the intensity and duration as tolerated.  Goal is to try to attain exercise frequency to 5 times a week.  If applicable: Best to perform resistance exercises (machines or weights) 2 days a week and cardio type exercises 3 days per week.  Orders Placed This Encounter  Procedures  . COMPLETE METABOLIC PANEL WITH GFR  . Lipid panel  . POCT CBC   Results for orders placed in visit on 05/21/12 (from the past 24 hour(s))  POCT CBC     Status: Abnormal   Collection Time  05/21/12 12:23 PM      Result Value Range   WBC 5.3  4.6 - 10.2 K/uL   Lymph, poc 1.5  0.6 - 3.4   POC LYMPH PERCENT 28.8  10 - 50 %L   MID (cbc)    0 - 0.9   POC MID %    0 - 12 %M   POC Granulocyte 3.3  2  - 6.9   Granulocyte percent 61.5  37 - 80 %G   RBC 4.4  4.04 - 5.48 M/uL   Hemoglobin 11.4 (*) 12.2 - 16.2 g/dL   HCT, POC 47.8 (*) 29.5 - 47.9 %   MCV 79.8 (*) 80 - 97 fL   MCH, POC 25.9 (*) 27 - 31.2 pg   MCHC 32.4  31.8 - 35.4 g/dL   RDW, POC 62.1     Platelet Count, POC 291.0  142 - 424 K/uL   MPV 6.7  0 - 99.8 fL   Meds ordered this encounter  Medications  . chlorthalidone (HYGROTON) 25 MG tablet    Sig: Take 1 tablet (25 mg total) by mouth daily.    Dispense:  90 tablet    Refill:  3    lifestyle therapeutic changes. Continue other meds for now. Discussed drug - drug interactions.  RTc in 4 weeks.  Amy Merritt P. Modesto Charon, M.D.

## 2012-06-21 ENCOUNTER — Ambulatory Visit: Payer: BC Managed Care – PPO | Admitting: Family Medicine

## 2012-07-18 ENCOUNTER — Ambulatory Visit: Payer: Self-pay | Admitting: Family Medicine

## 2012-08-03 ENCOUNTER — Inpatient Hospital Stay (HOSPITAL_COMMUNITY)
Admission: EM | Admit: 2012-08-03 | Discharge: 2012-08-06 | DRG: 320 | Disposition: A | Discharge status: Home / Self Care | Payer: BC Managed Care – PPO | Attending: Family Medicine | Admitting: Family Medicine

## 2012-08-03 ENCOUNTER — Telehealth: Payer: Self-pay | Admitting: Nurse Practitioner

## 2012-08-03 ENCOUNTER — Encounter (HOSPITAL_COMMUNITY): Payer: Self-pay | Admitting: *Deleted

## 2012-08-03 ENCOUNTER — Emergency Department (HOSPITAL_COMMUNITY): Payer: BC Managed Care – PPO

## 2012-08-03 DIAGNOSIS — I10 Essential (primary) hypertension: Secondary | ICD-10-CM

## 2012-08-03 DIAGNOSIS — E876 Hypokalemia: Secondary | ICD-10-CM

## 2012-08-03 DIAGNOSIS — M549 Dorsalgia, unspecified: Secondary | ICD-10-CM

## 2012-08-03 DIAGNOSIS — Z79899 Other long term (current) drug therapy: Secondary | ICD-10-CM

## 2012-08-03 DIAGNOSIS — G8929 Other chronic pain: Secondary | ICD-10-CM

## 2012-08-03 DIAGNOSIS — N12 Tubulo-interstitial nephritis, not specified as acute or chronic: Secondary | ICD-10-CM

## 2012-08-03 DIAGNOSIS — K589 Irritable bowel syndrome without diarrhea: Secondary | ICD-10-CM | POA: Diagnosis present

## 2012-08-03 DIAGNOSIS — G709 Myoneural disorder, unspecified: Secondary | ICD-10-CM | POA: Diagnosis present

## 2012-08-03 DIAGNOSIS — Z87442 Personal history of urinary calculi: Secondary | ICD-10-CM

## 2012-08-03 DIAGNOSIS — Z87891 Personal history of nicotine dependence: Secondary | ICD-10-CM

## 2012-08-03 DIAGNOSIS — K5900 Constipation, unspecified: Secondary | ICD-10-CM | POA: Diagnosis present

## 2012-08-03 DIAGNOSIS — F3289 Other specified depressive episodes: Secondary | ICD-10-CM | POA: Diagnosis present

## 2012-08-03 DIAGNOSIS — Z8249 Family history of ischemic heart disease and other diseases of the circulatory system: Secondary | ICD-10-CM

## 2012-08-03 DIAGNOSIS — Z6828 Body mass index (BMI) 28.0-28.9, adult: Secondary | ICD-10-CM

## 2012-08-03 DIAGNOSIS — R32 Unspecified urinary incontinence: Secondary | ICD-10-CM | POA: Diagnosis present

## 2012-08-03 DIAGNOSIS — Z833 Family history of diabetes mellitus: Secondary | ICD-10-CM

## 2012-08-03 DIAGNOSIS — M129 Arthropathy, unspecified: Secondary | ICD-10-CM | POA: Diagnosis present

## 2012-08-03 DIAGNOSIS — E669 Obesity, unspecified: Secondary | ICD-10-CM | POA: Diagnosis present

## 2012-08-03 DIAGNOSIS — M47817 Spondylosis without myelopathy or radiculopathy, lumbosacral region: Secondary | ICD-10-CM | POA: Diagnosis present

## 2012-08-03 DIAGNOSIS — D72829 Elevated white blood cell count, unspecified: Secondary | ICD-10-CM | POA: Diagnosis present

## 2012-08-03 DIAGNOSIS — R51 Headache: Secondary | ICD-10-CM | POA: Diagnosis present

## 2012-08-03 DIAGNOSIS — I129 Hypertensive chronic kidney disease with stage 1 through stage 4 chronic kidney disease, or unspecified chronic kidney disease: Secondary | ICD-10-CM | POA: Diagnosis present

## 2012-08-03 DIAGNOSIS — N189 Chronic kidney disease, unspecified: Secondary | ICD-10-CM | POA: Diagnosis present

## 2012-08-03 DIAGNOSIS — D649 Anemia, unspecified: Secondary | ICD-10-CM | POA: Diagnosis present

## 2012-08-03 DIAGNOSIS — E785 Hyperlipidemia, unspecified: Secondary | ICD-10-CM | POA: Diagnosis present

## 2012-08-03 DIAGNOSIS — F329 Major depressive disorder, single episode, unspecified: Secondary | ICD-10-CM | POA: Diagnosis present

## 2012-08-03 HISTORY — DX: Calculus of kidney: N20.0

## 2012-08-03 LAB — URINALYSIS, ROUTINE W REFLEX MICROSCOPIC
Bilirubin Urine: NEGATIVE
Ketones, ur: NEGATIVE mg/dL
Nitrite: POSITIVE — AB
Protein, ur: NEGATIVE mg/dL
Urobilinogen, UA: 0.2 mg/dL (ref 0.0–1.0)

## 2012-08-03 LAB — CBC WITH DIFFERENTIAL/PLATELET
Basophils Absolute: 0 10*3/uL (ref 0.0–0.1)
Basophils Relative: 0 % (ref 0–1)
Eosinophils Absolute: 0.1 10*3/uL (ref 0.0–0.7)
Lymphs Abs: 0.9 10*3/uL (ref 0.7–4.0)
MCH: 29.4 pg (ref 26.0–34.0)
Neutrophils Relative %: 83 % — ABNORMAL HIGH (ref 43–77)
Platelets: 256 10*3/uL (ref 150–400)
RBC: 3.84 MIL/uL — ABNORMAL LOW (ref 3.87–5.11)
RDW: 17.4 % — ABNORMAL HIGH (ref 11.5–15.5)

## 2012-08-03 LAB — LIPASE, BLOOD: Lipase: 16 U/L (ref 11–59)

## 2012-08-03 LAB — COMPREHENSIVE METABOLIC PANEL
AST: 24 U/L (ref 0–37)
Alkaline Phosphatase: 152 U/L — ABNORMAL HIGH (ref 39–117)
BUN: 9 mg/dL (ref 6–23)
CO2: 31 mEq/L (ref 19–32)
Chloride: 96 mEq/L (ref 96–112)
Creatinine, Ser: 0.9 mg/dL (ref 0.50–1.10)
GFR calc non Af Amer: 74 mL/min — ABNORMAL LOW (ref >90)
Potassium: 2.4 mEq/L — CL (ref 3.5–5.1)
Total Bilirubin: 0.2 mg/dL — ABNORMAL LOW (ref 0.3–1.2)

## 2012-08-03 LAB — URINE MICROSCOPIC-ADD ON

## 2012-08-03 MED ORDER — DEXTROSE 5 % IV SOLN
1.0000 g | Freq: Once | INTRAVENOUS | Status: AC
  Administered 2012-08-03: 1 g via INTRAVENOUS
  Filled 2012-08-03: qty 10

## 2012-08-03 MED ORDER — HYDROMORPHONE HCL PF 1 MG/ML IJ SOLN
1.0000 mg | Freq: Once | INTRAMUSCULAR | Status: AC
  Administered 2012-08-03: 1 mg via INTRAVENOUS
  Filled 2012-08-03: qty 1

## 2012-08-03 MED ORDER — IOHEXOL 300 MG/ML  SOLN
50.0000 mL | Freq: Once | INTRAMUSCULAR | Status: AC | PRN
  Administered 2012-08-03: 50 mL via ORAL

## 2012-08-03 MED ORDER — POTASSIUM CHLORIDE 10 MEQ/100ML IV SOLN
10.0000 meq | INTRAVENOUS | Status: DC
  Administered 2012-08-03: 10 meq via INTRAVENOUS
  Filled 2012-08-03: qty 100

## 2012-08-03 MED ORDER — PANTOPRAZOLE SODIUM 40 MG IV SOLR
40.0000 mg | Freq: Once | INTRAVENOUS | Status: AC
  Administered 2012-08-03: 40 mg via INTRAVENOUS
  Filled 2012-08-03 (×2): qty 40

## 2012-08-03 MED ORDER — HYDROMORPHONE HCL PF 1 MG/ML IJ SOLN
1.0000 mg | INTRAMUSCULAR | Status: DC | PRN
  Administered 2012-08-04: 1 mg via INTRAVENOUS
  Filled 2012-08-03: qty 1

## 2012-08-03 MED ORDER — SODIUM CHLORIDE 0.9 % IV BOLUS (SEPSIS)
1000.0000 mL | Freq: Once | INTRAVENOUS | Status: AC
  Administered 2012-08-03: 1000 mL via INTRAVENOUS

## 2012-08-03 MED ORDER — ONDANSETRON HCL 4 MG/2ML IJ SOLN
4.0000 mg | Freq: Once | INTRAMUSCULAR | Status: AC
  Administered 2012-08-03: 4 mg via INTRAVENOUS
  Filled 2012-08-03: qty 2

## 2012-08-03 MED ORDER — IOHEXOL 300 MG/ML  SOLN
100.0000 mL | Freq: Once | INTRAMUSCULAR | Status: AC | PRN
  Administered 2012-08-03: 100 mL via INTRAVENOUS

## 2012-08-03 MED ORDER — ONDANSETRON HCL 4 MG/2ML IJ SOLN: 4.0000 mg | Freq: Three times a day (TID) | INTRAMUSCULAR | Status: DC | PRN

## 2012-08-03 MED ORDER — POTASSIUM CHLORIDE 10 MEQ/100ML IV SOLN
10.0000 meq | INTRAVENOUS | Status: DC
  Administered 2012-08-03 – 2012-08-04 (×2): 10 meq via INTRAVENOUS
  Filled 2012-08-03 (×2): qty 100

## 2012-08-03 MED ORDER — SODIUM CHLORIDE 0.9 % IV BOLUS (SEPSIS): 1000.0000 mL | Freq: Once | INTRAVENOUS | Status: DC

## 2012-08-03 MED ORDER — SODIUM CHLORIDE 0.9 % IV SOLN
INTRAVENOUS | Status: DC
  Administered 2012-08-03: 23:00:00 via INTRAVENOUS

## 2012-08-03 MED ORDER — POTASSIUM CHLORIDE CRYS ER 20 MEQ PO TBCR
40.0000 meq | EXTENDED_RELEASE_TABLET | Freq: Once | ORAL | Status: AC
  Administered 2012-08-03: 40 meq via ORAL
  Filled 2012-08-03: qty 2

## 2012-08-03 NOTE — Telephone Encounter (Signed)
Pt told to go to er- she has extreme right upper side pain with fever.

## 2012-08-03 NOTE — ED Notes (Signed)
CRITICAL VALUE ALERT  Critical value received:  Potassium 2.4  Date of notification:  08/03/12  Time of notification:  1800  Critical value read back: Yes  Nurse who received alert:  Rudene Anda, RN  Responding MD:  Dr. Adriana Simas  Time MD responded:  1800

## 2012-08-03 NOTE — H&P (Signed)
[facility] History and Physical  Amy Merritt  ZOX:[id number]  DOB: [birth year]   DOA: 08/03/2012   PCP:   Rudi Heap, MD   Chief Complaint:  Abdominal pain since yesterday  Fever and Chills since thurs night; R fank pain heaches, nausea some voming;  HPI: Amy Merritt is a 52 y.o. female.  Middle-aged Caucasian with a recent history of lymphocytic colitis, history of renal stones, as been having right flank pain fever and chills since last night. It has been associated with some nausea and vomiting, frequency and mild dysuria. Because of failure to improve with home treatment she came to the emergency room thinking it was her colitis, but the CAT scan and urinalysis revealed evidence suggestive of right-sided pyelonephritis. Hospitalist service was called.  Additionally patient reports episodic chest pain/pressure.for the past month month and a half, and is concerned because her father had early cardiac MI age 2,.  Rewiew of Systems:   All systems negative except as marked bold or noted in the HPI;  Constitutional:    malaise, fever and chills. ;  Eyes:   eye pain, redness and discharge. ;  ENMT:   ear pain, hoarseness, nasal congestion, sinus pressure and sore throat. ;  Cardiovascular:    chest pain, palpitations, diaphoresis, dyspnea and peripheral edema.  Respiratory:   cough, hemoptysis, wheezing and stridor. ;  Gastrointestinal:  nausea, vomiting, diarrhea, constipation, abdominal pain, melena, blood in stool, hematemesis, jaundice and rectal bleeding. unusual weight loss..   Genitourinary:    frequency, dysuria, incontinence,flank pain and hematuria; Musculoskeletal:   back pain and neck pain.  swelling and trauma.;  Skin: .  pruritus, rash, abrasions, bruising and skin lesion.; ulcerations Neuro:    headache, lightheadedness and neck stiffness.  weakness, altered level of consciousness, altered mental status, extremity weakness, burning feet, involuntary movement,  seizure and syncope.  Psych:    anxiety, depression, insomnia, tearfulness, panic attacks, hallucinations, paranoia, suicidal or homicidal ideation    Past Medical History  Diagnosis Date  . Headache(784.0)   . Neuromuscular disorder   . Arthritis   . PONV (postoperative nausea and vomiting)   . Lymphocytic colitis   . Depression   . IBS (irritable bowel syndrome)   . Chronic kidney disease   . Kidney stone     Past Surgical History  Procedure Laterality Date  . Back surgery      x 4- lower back  . Tubal ligation    . Lithotrpsy    . Neck surgery      Medications:  HOME MEDS: Prior to Admission medications   Medication Sig Start Date End Date Taking? Authorizing Provider  ALPRAZolam Prudy Feeler) 1 MG tablet Take 1 mg by mouth 3 (three) times daily as needed. ANXIETY     Yes Historical Provider, MD  amphetamine-dextroamphetamine (ADDERALL) 10 MG tablet Take 10 mg by mouth daily as needed (for ADHD).    Yes Historical Provider, MD  chlorthalidone (HYGROTON) 25 MG tablet Take 1 tablet (25 mg total) by mouth daily. 05/21/12  Yes Ileana Ladd, MD  fentaNYL (DURAGESIC - DOSED MCG/HR) 100 MCG/HR Place 1 patch onto the skin every other day.     Yes Historical Provider, MD  gabapentin (NEURONTIN) 400 MG capsule Take 400 mg by mouth 3 (three) times daily.     Yes Historical Provider, MD  Glucosamine-Chondroitin (GLUCOSAMINE CHONDR COMPLEX PO) Take 1 tablet by mouth 3 (three) times daily.     Yes Historical Provider, MD  Ibuprofen-Famotidine (  DUEXIS) 800-26.6 MG TABS Take 1 tablet by mouth 3 (three) times daily.   Yes Historical Provider, MD  methocarbamol (ROBAXIN) 500 MG tablet Take 500 mg by mouth 3 (three) times daily as needed. MUSCLE SPASM     Yes Historical Provider, MD  oxycodone (ROXICODONE) 30 MG immediate release tablet Take 30 mg by mouth every 4 (four) hours as needed. PAIN     Yes Historical Provider, MD  venlafaxine (EFFEXOR) 75 MG tablet Take 75 mg by mouth 3 (three) times  daily.     Yes Historical Provider, MD     Allergies:  Allergies  Allergen Reactions  . Morphine And Related Itching and Swelling    Social History:   reports that she quit smoking about 23 months ago. She has never used smokeless tobacco. She reports that she does not drink alcohol or use illicit drugs.  Family History: Family History  Problem Relation Age of Onset  . Cancer Father     ?  Marland Kitchen Colitis Father   . Heart defect Father   . Hypertension Father   . Diabetes Father   . Heart disease Mother   . Hypertension Mother   . Diabetes      multiple aunts  . Hypertension Brother   . Colon cancer Neg Hx   . Rectal cancer Neg Hx   . Stomach cancer Neg Hx      Physical Exam: Filed Vitals:   08/03/12 1621 08/03/12 1823 08/03/12 2105  BP: 154/52 135/79   Pulse: 99 90   Temp: 99 F (37.2 C) 98.5 F (36.9 C) 98.4 F (36.9 C)  TempSrc: Oral Oral   Resp:  20   Height: 5\' 1"  (1.549 m)    Weight: 68.04 kg (150 lb)    SpO2: 97% 98%    Blood pressure 135/79, pulse 90, temperature 98.4 F (36.9 C), temperature source Oral, resp. rate 20, height 5\' 1"  (1.549 m), weight 68.04 kg (150 lb), last menstrual period 07/13/2012, SpO2 98.00%. Body mass index is 28.36 kg/(m^2).   GEN:  Pleasant Middle-aged Caucasian lady  lying bed in no acute distress; cooperative with exam PSYCH:  alert and oriented x4;   affect is appropriate. HEENT: Mucous membranes pink and anicteric; PERRLA; EOM intact; no cervical lymphadenopathy nor thyromegaly or Right carotid bruit; no JVD; Breasts:: Not examined CHEST WALL: No tenderness CHEST: Normal respiration, clear to auscultation bilaterally HEART: Regular rate and rhythm; no murmurs rubs or gallops BACK: No kyphosis no scoliosis;  right  CVA tenderness ABDOMEN: Obese, Riight flank  tenderness  no masses, no organomegaly, normal abdominal bowel sounds; Rectal Exam: Not done EXTREMITIES:  age-appropriate arthropathy of the hands and knees; trace  edema; no ulcerations. Genitalia: not examined PULSES: 2+ and symmetric SKIN: Normal hydration no rash or ulceration CNS: Cranial nerves 2-12 grossly intact no focal lateralizing neurologic deficit   Labs on Admission:  Basic Metabolic Panel:  Recent Labs Lab 08/03/12 1704  NA 137  K 2.4*  CL 96  CO2 31  GLUCOSE 99  BUN 9  CREATININE 0.90  CALCIUM 8.9   Liver Function Tests:  Recent Labs Lab 08/03/12 1704  AST 24  ALT 23  ALKPHOS 152*  BILITOT 0.2*  PROT 6.9  ALBUMIN 3.0*    Recent Labs Lab 08/03/12 1704  LIPASE 16   No results found for this basename: AMMONIA,  in the last 168 hours CBC:  Recent Labs Lab 08/03/12 1704  WBC 15.3*  NEUTROABS 12.7*  HGB 11.3*  HCT 33.8*  MCV 88.0  PLT 256   Cardiac Enzymes: No results found for this basename: CKTOTAL, CKMB, CKMBINDEX, TROPONINI,  in the last 168 hours BNP: No components found with this basename: POCBNP,  D-dimer: No components found with this basename: D-DIMER,  CBG: No results found for this basename: GLUCAP,  in the last 168 hours  Radiological Exams on Admission: Ct Abdomen Pelvis W Contrast  08/03/2012   *RADIOLOGY REPORT*  Clinical Data: Right lower quadrant abdominal pain with low grade fever for 1 day.  History of renal calculi, irritable bowel syndrome, lymphocytic colitis and back surgery.  CT ABDOMEN AND PELVIS WITH CONTRAST  Technique:  Multidetector CT imaging of the abdomen and pelvis was performed following the standard protocol during bolus administration of intravenous contrast.  Contrast: 50mL OMNIPAQUE IOHEXOL 300 MG/ML  SOLN, OMNIPAQUE IOHEXOL 300 MG/ML  SOLN  Comparison: Lumbar MRI 09/22/2011.  CT urogram 01/10/2011.  Findings: The lung bases are clear.  There is no pleural or pericardial effusion.  Mid abdominal assessment is mildly limited by streak artifact from the spinal hardware.  No abnormalities of the liver, spleen, gallbladder, pancreas or adrenal glands are identified.   The pancreas is mildly atrophied.  There is mild asymmetric perinephric soft tissue stranding on the right associated with heterogeneous low density in the right kidney.  No focal extraluminal fluid collection or significant delay in contrast excretion is identified.  There is no evidence of ureteral calculus.  Delayed images were obtained through the ureters and demonstrate opacification bilaterally to the ureteral vesicle junctions.  No bladder abnormalities are apparent.  The stomach, small bowel and appendix appear normal.  There is prominent stool throughout the colon.  No colonic wall thickening is identified.  The uterus and ovaries appear unremarkable.  No pelvic inflammatory changes are identified.  There is mild aorto iliac atherosclerosis.  The patient has undergone previous fusion from T11-S1.  The hardware appears intact.  The lower laminectomy defect appears grossly stable.  No bone destruction is identified.  IMPRESSION:  1.  Asymmetric perinephric soft tissue stranding on the right associated with heterogeneous low density in the right kidney, suggesting possible pyelonephritis.  Correlate clinically. 2.  No evidence of ureteral obstruction or ureteral calculus. 3.  No apparent complication related to previous extensive thoracolumbar fusion. 4.  Prominent stool throughout the colon.  No evidence of appendicitis.   Original Report Authenticated By: Carey Bullocks, M.D.     Assessment/Plan  Principal Problem:   Pyelonephritis Active Problems:   HTN (hypertension)   Back pain, chronic   HLD (hyperlipidemia)   Hypokalemia   Anemia   Motivation and readiness for weight loss intervention  Thinking old losing weight: Has been drinking water: To reduce caloric intake: Despite lumbar pains able to ride an elliptical: She is a member of the [employer] Has not been able to attend regularly because of looking after her grandson.  PLAN:  Rocephin for urinary tract infection/pyelonephritis pending  urine cultures   continue pain management Check magnesium and repletion of potassium; will continue chlorthalidone at current dose but will need potassium and possibly magnesium supplements; will continue chlorthalidone despite IV fluid hydration Anemia panel   Other plans as per orders.  Code Status:  full code  Family Communication: Plans discuss with patient and  husband  at bedside Disposition Plan: home when sufficiently improved    Trenity Pha Nocturnist [facility] Pager [phone]   08/03/2012, 9:37 PM

## 2012-08-03 NOTE — ED Provider Notes (Signed)
History  This chart was scribed for Donnetta Hutching, MD by Ardelia Mems, ED Scribe. This patient was seen in room APA15/[facility]15 and the patient's care was started at 4:36 PM.  CSN: [id number]  Arrival date & time 08/03/12  1617    Chief Complaint  Patient presents with  . Abdominal Pain    The history is provided by the patient. No language interpreter was used.   HPI Comments: Amy Merritt is a 52 y.o. female who presents to the Emergency Department complaining of gradually worsening, constant, severe RLQ abdominal pain onset last night. Pt reports mild periumbilical abdominal pain that onset yesterday afternoon which was of less concern. Pt also reports having an associated fever since last night, which she states got as high as 103.25F. Pt states that she has had a low grade fever today. Triage Temp is 64F. Pt states that her RLQ abdominal pain is worsened with deep inspiration. Pt also states that he appetite has been poor. 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 Pt states that she has been having diarrhea for 6 weeks, which she attributes to her colonic colitis. Pt also states that she has urinary incontinence which is baseline for her. Pt states that she still has her appendix. Pt denies abdominal distention, vaginal bleeding, vaginal discharge or any other symptoms.  PCP- Dr. Rudi Heap    Past Medical History  Diagnosis Date  . Headache(784.0)   . Neuromuscular disorder   . Arthritis   . PONV (postoperative nausea and vomiting)   . Lymphocytic colitis   . Depression   . IBS (irritable bowel syndrome)   . Chronic kidney disease   . Kidney stone    Past Surgical History  Procedure Laterality Date  . Back surgery      x 4- lower back  . Tubal ligation    . Lithotrpsy    . Neck surgery     Family History  Problem Relation Age of Onset  . Cancer Father     ?  Marland Kitchen Colitis  Father   . Heart defect Father   . Hypertension Father   . Diabetes Father   . Heart disease Mother   . Hypertension Mother   . Diabetes      multiple aunts  . Hypertension Brother   . Colon cancer Neg Hx   . Rectal cancer Neg Hx   . Stomach cancer Neg Hx    History  Substance Use Topics  . Smoking status: Former Smoker    Quit date: 08/27/2010  . Smokeless tobacco: Never Used  . Alcohol Use: No   OB History   Grav Para Term Preterm Abortions TAB SAB Ect Mult Living                 Review of Systems  Constitutional: Positive for fever and appetite change.  HENT: Negative for congestion, sore throat and rhinorrhea.   Eyes: Negative for visual disturbance.  Respiratory: Negative for cough and shortness of breath.   Cardiovascular: Negative for chest pain and leg swelling.  Gastrointestinal: Positive for abdominal pain (RLQ) and diarrhea. Negative for nausea, vomiting and abdominal distention.  Genitourinary: Negative for dysuria, hematuria, vaginal bleeding and vaginal discharge.       Urinary incontinence.  Musculoskeletal: Negative for back pain.  Skin: Negative for rash.   A complete 10 system review of systems was obtained  and all systems are negative except as noted in the HPI and PMH.   Allergies  Morphine and related  Home Medications   Current Outpatient Rx  Name  Route  Sig  Dispense  Refill  . ALPRAZolam (XANAX) 1 MG tablet   Oral   Take 1 mg by mouth 3 (three) times daily as needed. ANXIETY           . amphetamine-dextroamphetamine (ADDERALL) 10 MG tablet   Oral   Take 10 mg by mouth every morning.           . chlorthalidone (HYGROTON) 25 MG tablet   Oral   Take 1 tablet (25 mg total) by mouth daily.   90 tablet   3   . fentaNYL (DURAGESIC - DOSED MCG/HR) 100 MCG/HR   Transdermal   Place 1 patch onto the skin every other day.           . gabapentin (NEURONTIN) 400 MG capsule   Oral   Take 400 mg by mouth 3 (three) times daily.            . Glucosamine-Chondroitin (GLUCOSAMINE CHONDR COMPLEX PO)   Oral   Take 1 tablet by mouth 3 (three) times daily.           . hyoscyamine (LEVSIN SL) 0.125 MG SL tablet      1-2 by mouth before meals every four hours as needed   100 tablet   11   . Ibuprofen-Famotidine (DUEXIS) 800-26.6 MG TABS   Oral   Take 1 tablet by mouth 3 (three) times daily.         . methocarbamol (ROBAXIN) 500 MG tablet   Oral   Take 500 mg by mouth 3 (three) times daily as needed. MUSCLE SPASM           . oxycodone (ROXICODONE) 30 MG immediate release tablet   Oral   Take 30 mg by mouth every 4 (four) hours as needed. PAIN           . pantoprazole (PROTONIX) 40 MG tablet   Oral   Take 1 tablet (40 mg total) by mouth daily.   90 tablet   2   . sulfamethoxazole-trimethoprim (BACTRIM DS) 800-160 MG per tablet   Oral   Take 1 tablet by mouth 2 (two) times daily.           Marland Kitchen venlafaxine (EFFEXOR) 75 MG tablet   Oral   Take 75 mg by mouth 3 (three) times daily.            Triage Vitals: BP 154/52  Pulse 99  Temp(Src) 99 F (37.2 C) (Oral)  Ht 5\' 1"  (1.549 m)  Wt 150 lb (68.04 kg)  BMI 28.36 kg/m2  SpO2 97%  LMP 07/13/2012  Physical Exam  Nursing note and vitals reviewed. Constitutional: She is oriented to person, place, and time. She appears well-developed and well-nourished.  HENT:  Head: Normocephalic and atraumatic.  Eyes: Conjunctivae and EOM are normal. Pupils are equal, round, and reactive to light.  Neck: Normal range of motion. Neck supple.  Cardiovascular: Normal rate, regular rhythm and normal heart sounds.   Pulmonary/Chest: Effort normal and breath sounds normal.  Abdominal: Soft. Bowel sounds are normal.  Tenderness to palpation of RLQ.  Musculoskeletal: Normal range of motion.  Neurological: She is alert and oriented to person, place, and time.  Skin: Skin is warm and dry.  Psychiatric: She has a normal mood and affect.  ED Course  Procedures  (including critical care time)  DIAGNOSTIC STUDIES: Oxygen Saturation is 97% on RA, normal by my interpretation.    COORDINATION OF CARE: 4:42 PM- Pt advised of suspected appendicitis and pt agrees to diagnostic lab work and radiology. Pt is agreeable to pain management in the ED and states a preference for Dilaudid.  Medications  sodium chloride 0.9 % bolus 1,000 mL (0 mLs Intravenous Stopped 08/03/12 1943)  potassium chloride 10 mEq in 100 mL IVPB (not administered)  HYDROmorphone (DILAUDID) injection 1 mg (1 mg Intravenous Given 08/03/12 1732)  ondansetron (ZOFRAN) injection 4 mg (4 mg Intravenous Given 08/03/12 1731)  iohexol (OMNIPAQUE) 300 MG/ML solution 50 mL (50 mLs Oral Contrast Given 08/03/12 1719)  iohexol (OMNIPAQUE) 300 MG/ML solution 100 mL (100 mLs Intravenous Contrast Given 08/03/12 1750)  HYDROmorphone (DILAUDID) injection 1 mg (1 mg Intravenous Given 08/03/12 1745)  cefTRIAXone (ROCEPHIN) 1 g in dextrose 5 % 50 mL IVPB (0 g Intravenous Stopped 08/03/12 1922)  potassium chloride SA (K-DUR,KLOR-CON) CR tablet 40 mEq (40 mEq Oral Given 08/03/12 1947)  sodium chloride 0.9 % bolus 1,000 mL (1,000 mLs Intravenous New Bag/Given 08/03/12 1945)  ondansetron (ZOFRAN) injection 4 mg (4 mg Intravenous Given 08/03/12 1944)  pantoprazole (PROTONIX) injection 40 mg (40 mg Intravenous Given 08/03/12 1947)  HYDROmorphone (DILAUDID) injection 1 mg (1 mg Intravenous Given 08/03/12 2052)    Labs Reviewed  COMPREHENSIVE METABOLIC PANEL - Abnormal; Notable for the following:    Potassium 2.4 (*)    Albumin 3.0 (*)    Alkaline Phosphatase 152 (*)    Total Bilirubin 0.2 (*)    GFR calc non Af Amer 74 (*)    GFR calc Af Amer 86 (*)    All other components within normal limits  CBC WITH DIFFERENTIAL - Abnormal; Notable for the following:    WBC 15.3 (*)    RBC 3.84 (*)    Hemoglobin 11.3 (*)    HCT 33.8 (*)    RDW 17.4 (*)    Neutrophils Relative % 83 (*)    Neutro Abs 12.7 (*)    Lymphocytes  Relative 6 (*)    Monocytes Absolute 1.6 (*)    All other components within normal limits  URINALYSIS, ROUTINE W REFLEX MICROSCOPIC - Abnormal; Notable for the following:    Hgb urine dipstick SMALL (*)    Nitrite POSITIVE (*)    Leukocytes, UA SMALL (*)    All other components within normal limits  URINE MICROSCOPIC-ADD ON - Abnormal; Notable for the following:    Squamous Epithelial / LPF FEW (*)    Bacteria, UA MANY (*)    All other components within normal limits  URINE CULTURE  LIPASE, BLOOD    Ct Abdomen Pelvis W Contrast  08/03/2012   *RADIOLOGY REPORT*  Clinical Data: Right lower quadrant abdominal pain with low grade fever for 1 day.  History of renal calculi, irritable bowel syndrome, lymphocytic colitis and back surgery.  CT ABDOMEN AND PELVIS WITH CONTRAST  Technique:  Multidetector CT imaging of the abdomen and pelvis was performed following the standard protocol during bolus administration of intravenous contrast.  Contrast: 50mL OMNIPAQUE IOHEXOL 300 MG/ML  SOLN, OMNIPAQUE IOHEXOL 300 MG/ML  SOLN  Comparison: Lumbar MRI 09/22/2011.  CT urogram 01/10/2011.  Findings: The lung bases are clear.  There is no pleural or pericardial effusion.  Mid abdominal assessment is mildly limited by streak artifact from the spinal hardware.  No abnormalities of the liver, spleen,  gallbladder, pancreas or adrenal glands are identified.  The pancreas is mildly atrophied.  There is mild asymmetric perinephric soft tissue stranding on the right associated with heterogeneous low density in the right kidney.  No focal extraluminal fluid collection or significant delay in contrast excretion is identified.  There is no evidence of ureteral calculus.  Delayed images were obtained through the ureters and demonstrate opacification bilaterally to the ureteral vesicle junctions.  No bladder abnormalities are apparent.  The stomach, small bowel and appendix appear normal.  There is prominent stool throughout  the colon.  No colonic wall thickening is identified.  The uterus and ovaries appear unremarkable.  No pelvic inflammatory changes are identified.  There is mild aorto iliac atherosclerosis.  The patient has undergone previous fusion from T11-S1.  The hardware appears intact.  The lower laminectomy defect appears grossly stable.  No bone destruction is identified.  IMPRESSION:  1.  Asymmetric perinephric soft tissue stranding on the right associated with heterogeneous low density in the right kidney, suggesting possible pyelonephritis.  Correlate clinically. 2.  No evidence of ureteral obstruction or ureteral calculus. 3.  No apparent complication related to previous extensive thoracolumbar fusion. 4.  Prominent stool throughout the colon.  No evidence of appendicitis.   Original Report Authenticated By: Carey Bullocks, M.D.    No diagnosis found.  MDM  Suspect pyelonephritis as etiology for patient's symptoms. IV Rocephin, urine culture, fluids, pain management.   Admit to general medicine        I personally performed the services described in this documentation, which was scribed in my presence. The recorded information has been reviewed and is accurate.    Donnetta Hutching, MD 08/03/12 2146

## 2012-08-03 NOTE — ED Notes (Signed)
RLQ pain, fever 103.7 last night,  Low grade fever today,  Nausea, no vomting, diarrhea for 6 weeks.Marland Kitchen

## 2012-08-04 DIAGNOSIS — N12 Tubulo-interstitial nephritis, not specified as acute or chronic: Secondary | ICD-10-CM | POA: Diagnosis present

## 2012-08-04 DIAGNOSIS — D649 Anemia, unspecified: Secondary | ICD-10-CM | POA: Diagnosis present

## 2012-08-04 DIAGNOSIS — E876 Hypokalemia: Secondary | ICD-10-CM | POA: Diagnosis present

## 2012-08-04 LAB — BASIC METABOLIC PANEL
Chloride: 102 mEq/L (ref 96–112)
GFR calc Af Amer: 82 mL/min — ABNORMAL LOW (ref >90)
GFR calc non Af Amer: 71 mL/min — ABNORMAL LOW (ref >90)
Potassium: 3.2 mEq/L — ABNORMAL LOW (ref 3.5–5.1)
Sodium: 138 mEq/L (ref 135–145)

## 2012-08-04 LAB — CBC
HCT: 31.6 % — ABNORMAL LOW (ref 36.0–46.0)
Hemoglobin: 10.4 g/dL — ABNORMAL LOW (ref 12.0–15.0)
RBC: 3.52 MIL/uL — ABNORMAL LOW (ref 3.87–5.11)
WBC: 14.9 10*3/uL — ABNORMAL HIGH (ref 4.0–10.5)

## 2012-08-04 LAB — TROPONIN I
Troponin I: <0.3 ng/mL (ref <0.30)
Troponin I: <0.3 ng/mL (ref <0.30)
Troponin I: <0.3 ng/mL (ref <0.30)

## 2012-08-04 LAB — VITAMIN B12: Vitamin B-12: 308 pg/mL (ref 211–911)

## 2012-08-04 LAB — IRON AND TIBC
Iron: <10 ug/dL — ABNORMAL LOW (ref 42–135)
UIBC: 190 ug/dL (ref 125–400)

## 2012-08-04 LAB — RETICULOCYTES
RBC.: 3.52 MIL/uL — ABNORMAL LOW (ref 3.87–5.11)
Retic Count, Absolute: 59.8 10*3/uL (ref 19.0–186.0)

## 2012-08-04 LAB — FOLATE: Folate: 18.2 ng/mL

## 2012-08-04 LAB — MAGNESIUM: Magnesium: 1.6 mg/dL (ref 1.5–2.5)

## 2012-08-04 MED ORDER — ONDANSETRON HCL 4 MG/2ML IJ SOLN: 4.0000 mg | INTRAMUSCULAR | Status: DC | PRN

## 2012-08-04 MED ORDER — FLEET ENEMA 7-19 GM/118ML RE ENEM: 1.0000 | ENEMA | Freq: Once | RECTAL | Status: AC | PRN

## 2012-08-04 MED ORDER — TRAZODONE HCL 50 MG PO TABS: 25.0000 mg | ORAL_TABLET | Freq: Every evening | ORAL | Status: DC | PRN

## 2012-08-04 MED ORDER — SORBITOL 70 % SOLN: 30.0000 mL | Freq: Every day | Status: DC | PRN

## 2012-08-04 MED ORDER — VENLAFAXINE HCL 37.5 MG PO TABS
75.0000 mg | ORAL_TABLET | Freq: Three times a day (TID) | ORAL | Status: DC
  Administered 2012-08-04 – 2012-08-06 (×7): 75 mg via ORAL
  Filled 2012-08-04 (×7): qty 2

## 2012-08-04 MED ORDER — PANTOPRAZOLE SODIUM 40 MG PO TBEC
40.0000 mg | DELAYED_RELEASE_TABLET | Freq: Every day | ORAL | Status: DC
  Administered 2012-08-04 – 2012-08-06 (×3): 40 mg via ORAL
  Filled 2012-08-04 (×3): qty 1

## 2012-08-04 MED ORDER — FENTANYL 100 MCG/HR TD PT72
100.0000 ug | MEDICATED_PATCH | TRANSDERMAL | Status: DC
  Filled 2012-08-04: qty 1

## 2012-08-04 MED ORDER — DOCUSATE SODIUM 100 MG PO CAPS
100.0000 mg | ORAL_CAPSULE | Freq: Two times a day (BID) | ORAL | Status: DC
  Administered 2012-08-04: 100 mg via ORAL
  Filled 2012-08-04 (×5): qty 1

## 2012-08-04 MED ORDER — HYDROMORPHONE HCL PF 1 MG/ML IJ SOLN
0.5000 mg | INTRAMUSCULAR | Status: DC | PRN
  Administered 2012-08-04 – 2012-08-05 (×7): 1 mg via INTRAVENOUS
  Filled 2012-08-04 (×7): qty 1

## 2012-08-04 MED ORDER — CHLORTHALIDONE 25 MG PO TABS
25.0000 mg | ORAL_TABLET | Freq: Every day | ORAL | Status: DC
  Administered 2012-08-04 – 2012-08-06 (×3): 25 mg via ORAL
  Filled 2012-08-04 (×5): qty 1

## 2012-08-04 MED ORDER — GABAPENTIN 400 MG PO CAPS
400.0000 mg | ORAL_CAPSULE | Freq: Three times a day (TID) | ORAL | Status: DC
  Administered 2012-08-04 – 2012-08-06 (×7): 400 mg via ORAL
  Filled 2012-08-04 (×7): qty 1

## 2012-08-04 MED ORDER — ENOXAPARIN SODIUM 40 MG/0.4ML ~~LOC~~ SOLN
40.0000 mg | SUBCUTANEOUS | Status: DC
  Administered 2012-08-04 – 2012-08-05 (×2): 40 mg via SUBCUTANEOUS
  Filled 2012-08-04 (×3): qty 0.4

## 2012-08-04 MED ORDER — DEXTROSE 5 % IV SOLN
1.0000 g | INTRAVENOUS | Status: DC
  Administered 2012-08-04 – 2012-08-05 (×2): 1 g via INTRAVENOUS
  Filled 2012-08-04 (×3): qty 10

## 2012-08-04 MED ORDER — POTASSIUM CHLORIDE CRYS ER 20 MEQ PO TBCR
40.0000 meq | EXTENDED_RELEASE_TABLET | ORAL | Status: AC
  Administered 2012-08-04 (×3): 40 meq via ORAL
  Filled 2012-08-04: qty 1
  Filled 2012-08-04 (×2): qty 2

## 2012-08-04 MED ORDER — ACETAMINOPHEN 325 MG PO TABS
650.0000 mg | ORAL_TABLET | ORAL | Status: DC | PRN
  Administered 2012-08-04 – 2012-08-06 (×9): 650 mg via ORAL
  Filled 2012-08-04 (×9): qty 2

## 2012-08-04 MED ORDER — ALPRAZOLAM 1 MG PO TABS
1.0000 mg | ORAL_TABLET | Freq: Three times a day (TID) | ORAL | Status: DC | PRN
  Administered 2012-08-05: 1 mg via ORAL
  Administered 2012-08-05: 0.5 mg via ORAL
  Administered 2012-08-05: 1 mg via ORAL
  Filled 2012-08-04 (×3): qty 1

## 2012-08-04 NOTE — Progress Notes (Signed)
[facility] PROGRESS NOTE  Amy Merritt ZOX:[id number] DOB: 03-Sep-1960 DOA: 08/03/2012 PCP: Rudi Heap, MD  Assessment/Plan: 1. Right-sided pyelonephritis with associated abdominal pain and fever: Followup urine culture. Continue empiric therapy with Rocephin. Follow laboratory studies. Nontoxic. 2. Hypokalemia: Replete. Serum magnesium normal. Repeat basic metabolic panel in the morning. 3. Constipation: Bowel regimen. 4. 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 5. History of nephrolithiasis 6. History of lymphocytic colitis   Followup BMP, CBC  Replete potassium. Repeat basic metabolic panel the morning.  Followup urine culture  Code Status: Full code DVT prophylaxis: Lovenox Family Communication: None present Disposition Plan: Home when improved  Amy Sacks, MD  [facility]  Pager [phone] If 7PM-7AM, please contact night-coverage at [email or web address], password [facility] 08/04/2012, 7:36 AM  LOS: 1 day   Clinical Summary: 52 year old woman with history of nephrolithiasis and lymphocytic colitis presented to the emergency department with fever and right flank pain with associated nausea, vomiting. Initial evaluation suggested right-sided pyelonephritis and patient was admitted for further evaluation.  Consultants:    Procedures:    Antibiotics:  Ceftriaxone 6/27 >>   HPI/Subjective: Afebrile, vital signs stable. Feels about the same. Has significant right flank pain and some generalized abdominal tenderness, especially in the lower abdomen. No vomiting.  Objective: Filed Vitals:   08/03/12 1823 08/03/12 2105 08/03/12 2321 08/04/12 0427  BP: 135/79  100/62 96/62  Pulse: 90  116 5  Temp: 98.5 F (36.9 C) 98.4 F (36.9 C) 98.7 F (37.1 C) 98.3 F (36.8 C)  TempSrc: Oral  Oral Oral  Resp: 20  22 20   Height:   5\' 1"  (1.549 m)   Weight:   74.4 kg (164 lb 0.4 oz) 74.4 kg (164 lb 0.4 oz)  SpO2: 98%  94% 93%     Intake/Output Summary (Last 24 hours) at 08/04/12 0736 Last data filed at 08/04/12 0512  Gross per 24 hour  Intake    240 ml  Output      0 ml  Net    240 ml     Filed Weights   08/03/12 1621 08/03/12 2321 08/04/12 0427  Weight: 68.04 kg (150 lb) 74.4 kg (164 lb 0.4 oz) 74.4 kg (164 lb 0.4 oz)    Exam:   General: Appears calm, mildly uncomfortable. Nontoxic.  Cardiovascular regular rate and rhythm. No murmur, rub, gallop. No lower extremity edema.  Respiratory clear to auscultation bilaterally. No wheezes, rales, rhonchi. Normal respiratory effort.  Abdomen exquisite right CVA tenderness, moderate lower abdominal and suprapubic tenderness. No signs of peritonitis.  Data Reviewed:  Laboratory studies from last night were notable for potassium 2.4, white blood cell count 15.3. Urinalysis equivocal.   CT of the abdomen and pelvis suggested right-sided pyelonephritis. No evidence of ureteral obstruction or calculus. Prominent stool is seen. No evidence of appendicitis.  Pending studies:   Urine culture  Morning laboratory studies  Scheduled Meds: . sodium chloride   Intravenous STAT  . cefTRIAXone (ROCEPHIN)  IV  1 g Intravenous Q24H  . chlorthalidone  25 mg Oral Daily  . docusate sodium  100 mg Oral BID  . enoxaparin (LOVENOX) injection  40 mg Subcutaneous Q24H  . [START ON 08/06/2012] fentaNYL  100 mcg Transdermal Q72H  . gabapentin  400 mg Oral TID  . pantoprazole  40 mg Oral Daily  . potassium chloride  40 mEq Oral Q4H  . venlafaxine  75 mg Oral TID   Continuous Infusions:   Principal Problem:  Pyelonephritis Active Problems:   HTN (hypertension)   Back pain, chronic   HLD (hyperlipidemia)   Hypokalemia   Anemia   Time spent 15 minutes

## 2012-08-04 NOTE — Progress Notes (Signed)
Nutrition Brief Note  Patient identified on the Malnutrition Screening Tool (MST) Report  Body mass index is 31.01 kg/(m^2). Patient meets criteria for obesity class I based on current BMI. Pt reports UBW 150-160#.  Current diet order is Heart Healthy, patient is consuming approximately n/a % of meals at this time. Labs and medications reviewed.  No nutrition interventions warranted at this time. If nutrition issues arise, please consult RD.   Royann Shivers MS,RD,LDN,CSG Office: [phone] Pager: [phone]

## 2012-08-05 LAB — CBC
MCHC: 33.4 g/dL (ref 30.0–36.0)
MCV: 89.3 fL (ref 78.0–100.0)
Platelets: 243 10*3/uL (ref 150–400)
RDW: 18 % — ABNORMAL HIGH (ref 11.5–15.5)
WBC: 10.8 10*3/uL — ABNORMAL HIGH (ref 4.0–10.5)

## 2012-08-05 LAB — BASIC METABOLIC PANEL
Chloride: 100 mEq/L (ref 96–112)
Creatinine, Ser: 0.79 mg/dL (ref 0.50–1.10)
GFR calc Af Amer: >90 mL/min (ref >90)
Potassium: 2.7 mEq/L — CL (ref 3.5–5.1)
Sodium: 138 mEq/L (ref 135–145)

## 2012-08-05 MED ORDER — SODIUM CHLORIDE 0.9 % IV SOLN
INTRAVENOUS | Status: AC
  Administered 2012-08-05 (×2): via INTRAVENOUS

## 2012-08-05 MED ORDER — POTASSIUM CHLORIDE CRYS ER 20 MEQ PO TBCR
40.0000 meq | EXTENDED_RELEASE_TABLET | Freq: Three times a day (TID) | ORAL | Status: AC
  Administered 2012-08-05 – 2012-08-06 (×4): 40 meq via ORAL
  Filled 2012-08-05 (×4): qty 2

## 2012-08-05 MED ORDER — OXYCODONE HCL 5 MG PO TABS
30.0000 mg | ORAL_TABLET | ORAL | Status: DC | PRN
  Administered 2012-08-05 – 2012-08-06 (×5): 30 mg via ORAL
  Filled 2012-08-05 (×5): qty 6
  Filled 2012-08-05: qty 1
  Filled 2012-08-05: qty 6

## 2012-08-05 NOTE — Progress Notes (Signed)
CRITICAL VALUE ALERT  Critical value received:  Potassium 2.7  Date of notification:  08/05/12  Time of notification:  0850  Critical value read back:yes  Nurse who received alert:  Koleen Nimrod RN  MD notified (1st page):  Dr Irene Limbo  Time of first page:  0850  MD notified (2nd page):  Time of second page:  Responding MD:  Dr Irene Limbo  Time MD responded:  [phone]

## 2012-08-05 NOTE — Progress Notes (Addendum)
[facility] PROGRESS NOTE  Amy Merritt ZOX:[id number] DOB: [birth year] DOA: 08/03/2012 PCP: Rudi Heap, MD  Assessment/Plan: 1. Right-sided pyelonephritis with associated abdominal pain and fever: Clinically improving, fever resolved, leukocytosis nearly resolved. Continue empiric therapy with Rocephin, followup urine culture. 2. Hypokalemia: Replete. Magnesium normal. No acidosis noted. 3. Headache: Exam nonfocal. Supportive care. 4. Constipation: Continue bowel regimen. 5. 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 6. History of nephrolithiasis 7. History of lymphocytic colitis 8. Obesity class I: No nutrition interventions warrant it at this time.   Replete potassium, recheck BMP in the morning.  Followup urine culture  Likely home in the next 24 hours  Code Status: Full code DVT prophylaxis: Lovenox Family Communication: None present Disposition Plan: Home when improved  [name], MD  [facility]  Pager [phone] If 7PM-7AM, please contact night-coverage at [email or web address], password [facility] 08/05/2012, 11:21 AM  LOS: 2 days   Clinical Summary: 52 year old woman with history of nephrolithiasis and lymphocytic colitis presented to the emergency department with fever and right flank pain with associated nausea, vomiting. Initial evaluation suggested right-sided pyelonephritis and patient was admitted for further evaluation.  Consultants:    Procedures:    Antibiotics:  Ceftriaxone 6/27 >>   HPI/Subjective: Remains afebrile. Vitals stable. Adequate urine output. No nausea or vomiting. Flank pain has much improved. She complains of a headache which is her main problem at this point.  Objective: Filed Vitals:   08/04/12 1801 08/04/12 2014 08/05/12 0213 08/05/12 0407  BP: 132/85 124/79 148/83 132/84  Pulse: 79 77 81 90  Temp: 98.7 F (37.1 C) 97.9 F (36.6 C) 98.3 F (36.8 C) 98.3 F (36.8 C)  TempSrc: Oral  Oral Oral Oral  Resp: 20 20 20 20   Height:      Weight:    68.8 kg (151 lb 10.8 oz)  SpO2: 97% 100% 97% 100%    Intake/Output Summary (Last 24 hours) at 08/05/12 1121 Last data filed at 08/05/12 0408  Gross per 24 hour  Intake    480 ml  Output   1300 ml  Net   -820 ml     Filed Weights   08/03/12 2321 08/04/12 0427 08/05/12 0407  Weight: 74.4 kg (164 lb 0.4 oz) 74.4 kg (164 lb 0.4 oz) 68.8 kg (151 lb 10.8 oz)    Exam:   General: Appears calm, more comfortable today. Nontoxic.  Cardiovascular regular rate and rhythm. No murmur, rub, gallop. No lower extremity edema.  Respiratory clear to auscultation bilaterally. No wheezes, rales, rhonchi. Normal respiratory effort.  Abdomen soft, nontender, minimal right-sided tenderness. Decreased right flank pain with palpation.  Data Reviewed:  Potassium 2.7, magnesium normal. Next lung cardiac enzymes negative.  Decreased white blood cell count, now near normal 10.8. Hemoglobin stable 11.4.  Pending studies:   Urine culture  Scheduled Meds: . cefTRIAXone (ROCEPHIN)  IV  1 g Intravenous Q24H  . chlorthalidone  25 mg Oral Daily  . docusate sodium  100 mg Oral BID  . enoxaparin (LOVENOX) injection  40 mg Subcutaneous Q24H  . [START ON 08/06/2012] fentaNYL  100 mcg Transdermal Q72H  . gabapentin  400 mg Oral TID  . pantoprazole  40 mg Oral Daily  . potassium chloride  40 mEq Oral TID  . venlafaxine  75 mg Oral TID   Continuous Infusions: . sodium chloride 150 mL/hr at 08/05/12 1017    Principal Problem:   Pyelonephritis Active Problems:   HTN (hypertension)   Back pain, chronic  HLD (hyperlipidemia)   Hypokalemia   Anemia   Time spent 15 minutes

## 2012-08-06 LAB — URINE CULTURE

## 2012-08-06 MED ORDER — ONDANSETRON HCL 4 MG PO TABS: 4.0000 mg | ORAL_TABLET | Freq: Three times a day (TID) | ORAL | Status: DC | PRN

## 2012-08-06 MED ORDER — CEFUROXIME AXETIL 250 MG PO TABS: 500.0000 mg | ORAL_TABLET | Freq: Two times a day (BID) | ORAL | Status: DC

## 2012-08-06 MED ORDER — CEFUROXIME AXETIL 500 MG PO TABS: 500.0000 mg | ORAL_TABLET | Freq: Two times a day (BID) | ORAL | Status: DC

## 2012-08-06 NOTE — Progress Notes (Signed)
AVS reviewed with patient.  Prescriptions provided to pt.  Pt educated on what medications are due to be taken for the remainder of the day.  Pt verbalized understanding of d/c instructions.  Education material on pyelonephritis provided to patient.  Teach back method used.  Pt's IV removed.  Site WNL.  Pt reports all belongings intact and in possession at discharge.  Pt transported by NT via w/c to main entrance for discharge.

## 2012-08-06 NOTE — Discharge Summary (Signed)
Physician Discharge Summary  Amy Merritt ZOX:[id number] DOB: 1960-03-10 DOA: 08/03/2012  PCP: Amy Heap, MD  Admit date: 08/03/2012 Discharge date: 08/06/2012  Recommendations for Outpatient Follow-up:  1. Followup resolution of right-sided pyelonephritis. Urine culture pending.   Follow-up Information   Follow up with Amy Heap, MD In 2 weeks.   Contact information:   [address] [place] [address] [phone]      Discharge Diagnoses:  1. Right-sided pyelonephritis with associated abdominal pain and fever 2. Hypokalemia  Discharge Condition: Improved Disposition: Home  Diet recommendation: Regular  Filed Weights   08/04/12 0427 08/05/12 0407 08/06/12 0447  Weight: 74.4 kg (164 lb 0.4 oz) 68.8 kg (151 lb 10.8 oz) 66.8 kg (147 lb 4.3 oz)    History of present illness:  52 year old woman with history of nephrolithiasis and lymphocytic colitis presented to the emergency department with fever and right flank pain with associated nausea, vomiting. Initial evaluation suggested right-sided pyelonephritis and patient was admitted for further evaluation.  Hospital Course:  Amy Merritt was started on empiric antibiotic therapy for pyelonephritis, she gradually improved and is now stable for discharge. Her hospitalization was uncomplicated. Urine culture is pending at this time, she will discharge home on empiric cefuroxime. Individual issues as below.  1. Right-sided pyelonephritis with associated abdominal pain and fever: Clinically resolved. Change to oral antibiotics. Followup urine culture. 2. Hypokalemia: Repleted. Serum magnesium was normal. 3. Headache: Resolved. 4. 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 Continue Duragesic. 5. History of nephrolithiasis 6. History of lymphocytic colitis 7. Obesity class I: No nutrition interventions warrant it at this time.  Consultants:  None  Procedures:  None  Antibiotics:  Ceftriaxone 6/27  >> 6/29  Cefuroxime 6/30 >> 7/3  Discharge Instructions  Discharge Orders   Future Orders Complete By Expires     Activity as tolerated - No restrictions  As directed     Diet general  As directed     Discharge instructions  As directed     Comments:      Be sure to finish antibiotic. Call your physician or seek immediate medical attention for recurrent fever, abdominal pain, uncontrollable nausea or vomiting or worsening of condition.        Medication List         ALPRAZolam 1 MG tablet  Commonly known as:  XANAX  - Take 1 mg by mouth 3 (three) times daily as needed. ANXIETY  -      amphetamine-dextroamphetamine 10 MG tablet  Commonly known as:  ADDERALL  Take 10 mg by mouth daily as needed (for ADHD).     cefUROXime 500 MG tablet  Commonly known as:  CEFTIN  Take 1 tablet (500 mg total) by mouth 2 (two) times daily with a meal. Start evening 6/30.     chlorthalidone 25 MG tablet  Commonly known as:  HYGROTON  Take 1 tablet (25 mg total) by mouth daily.     DUEXIS 800-26.6 MG Tabs  Generic drug:  Ibuprofen-Famotidine  Take 1 tablet by mouth 3 (three) times daily.     fentaNYL 100 MCG/HR  Commonly known as:  DURAGESIC - dosed mcg/hr  Place 1 patch onto the skin every other day.     gabapentin 400 MG capsule  Commonly known as:  NEURONTIN  Take 400 mg by mouth 3 (three) times daily.     GLUCOSAMINE CHONDR COMPLEX PO  Take 1 tablet by mouth 3 (three) times daily.  methocarbamol 500 MG tablet  Commonly known as:  ROBAXIN  - Take 500 mg by mouth 3 (three) times daily as needed. MUSCLE SPASM  -      ondansetron 4 MG tablet  Commonly known as:  ZOFRAN  Take 1 tablet (4 mg total) by mouth every 8 (eight) hours as needed.     oxycodone 30 MG immediate release tablet  Commonly known as:  ROXICODONE  - Take 30 mg by mouth every 4 (four) hours as needed. PAIN  -      venlafaxine 75 MG tablet  Commonly known as:  EFFEXOR  Take 75 mg by mouth 3 (three)  times daily.       Allergies  Allergen Reactions  . Morphine And Related Itching and Swelling    The results of significant diagnostics from this hospitalization (including imaging, microbiology, ancillary and laboratory) are listed below for reference.    Significant Diagnostic Studies: Ct Abdomen Pelvis W Contrast  08/03/2012   *RADIOLOGY REPORT*  Clinical Data: Right lower quadrant abdominal pain with low grade fever for 1 day.  History of renal calculi, irritable bowel syndrome, lymphocytic colitis and back surgery.  CT ABDOMEN AND PELVIS WITH CONTRAST  Technique:  Multidetector CT imaging of the abdomen and pelvis was performed following the standard protocol during bolus administration of intravenous contrast.  Contrast: 50mL OMNIPAQUE IOHEXOL 300 MG/ML  SOLN, OMNIPAQUE IOHEXOL 300 MG/ML  SOLN  Comparison: Lumbar MRI 09/22/2011.  CT urogram 01/10/2011.  Findings: The lung bases are clear.  There is no pleural or pericardial effusion.  Mid abdominal assessment is mildly limited by streak artifact from the spinal hardware.  No abnormalities of the liver, spleen, gallbladder, pancreas or adrenal glands are identified.  The pancreas is mildly atrophied.  There is mild asymmetric perinephric soft tissue stranding on the right associated with heterogeneous low density in the right kidney.  No focal extraluminal fluid collection or significant delay in contrast excretion is identified.  There is no evidence of ureteral calculus.  Delayed images were obtained through the ureters and demonstrate opacification bilaterally to the ureteral vesicle junctions.  No bladder abnormalities are apparent.  The stomach, small bowel and appendix appear normal.  There is prominent stool throughout the colon.  No colonic wall thickening is identified.  The uterus and ovaries appear unremarkable.  No pelvic inflammatory changes are identified.  There is mild aorto iliac atherosclerosis.  The patient has undergone  previous fusion from T11-S1.  The hardware appears intact.  The lower laminectomy defect appears grossly stable.  No bone destruction is identified.  IMPRESSION:  1.  Asymmetric perinephric soft tissue stranding on the right associated with heterogeneous low density in the right kidney, suggesting possible pyelonephritis.  Correlate clinically. 2.  No evidence of ureteral obstruction or ureteral calculus. 3.  No apparent complication related to previous extensive thoracolumbar fusion. 4.  Prominent stool throughout the colon.  No evidence of appendicitis.   Original Report Authenticated By: Carey Bullocks, M.D.    Microbiology: Recent Results (from the past 240 hour(s))  URINE CULTURE     Status: None   Collection Time    08/03/12  5:25 PM      Result Value Range Status   Specimen Description URINE, CLEAN CATCH   Final   Special Requests NONE   Final   Culture  Setup Time 08/04/2012 19:09   Final   Colony Count >=100,000 COLONIES/ML   Final   Culture ESCHERICHIA COLI   Final  Report Status PENDING   Incomplete     Labs: Basic Metabolic Panel:  Recent Labs Lab 08/03/12 1704 08/04/12 0152 08/04/12 0833 08/05/12 0756  NA 137  --  138 138  K 2.4*  --  3.2* 2.7*  CL 96  --  102 100  CO2 31  --  28 31  GLUCOSE 99  --  150* 117*  BUN 9  --  8 5*  CREATININE 0.90  --  0.93 0.79  CALCIUM 8.9  --  8.2* 8.9  MG  --  1.6  --  1.9   Liver Function Tests:  Recent Labs Lab 08/03/12 1704  AST 24  ALT 23  ALKPHOS 152*  BILITOT 0.2*  PROT 6.9  ALBUMIN 3.0*    Recent Labs Lab 08/03/12 1704  LIPASE 16   CBC:  Recent Labs Lab 08/03/12 1704 08/04/12 0833 08/05/12 0756  WBC 15.3* 14.9* 10.8*  NEUTROABS 12.7*  --   --   HGB 11.3* 10.4* 11.4*  HCT 33.8* 31.6* 34.1*  MCV 88.0 89.8 89.3  PLT 256 203 243   Cardiac Enzymes:  Recent Labs Lab 08/04/12 0152 08/04/12 0834 08/04/12 1511  TROPONINI <0.30 <0.30 <0.30    Principal Problem:   Pyelonephritis Active  Problems:   HTN (hypertension)   Back pain, chronic   HLD (hyperlipidemia)   Hypokalemia   Anemia   Time coordinating discharge: 25 minutes  Signed:  [name], MD [facility] 08/06/2012, 12:27 PM

## 2012-08-06 NOTE — Progress Notes (Signed)
UR chart review completed.  

## 2012-08-06 NOTE — Progress Notes (Signed)
[facility] PROGRESS NOTE  Amy Merritt AVW:[id number] DOB: 03/26/1960 DOA: 08/03/2012 PCP: Rudi Heap, MD  Assessment/Plan: 1. Right-sided pyelonephritis with associated abdominal pain and fever: Clinically resolved. Change to oral antibiotics. Followup urine culture. 2. Hypokalemia: Repleted. Serum magnesium was normal. 3. Headache: Resolved. 4. 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 Continue Duragesic. 5. History of nephrolithiasis 6. History of lymphocytic colitis 7. Obesity class I: No nutrition interventions warrant it at this time.   Discontinue Rocephin. Start cefuroxime. Followup urine cultures an outpatient.  Home today  Code Status: Full code DVT prophylaxis: Lovenox Family Communication: None present Disposition Plan: Home   [name], MD  [facility]  Pager [phone] If 7PM-7AM, please contact night-coverage at [email or web address], password [facility] 08/06/2012, 12:18 PM  LOS: 3 days   Clinical Summary: 52 year old woman with history of nephrolithiasis and lymphocytic colitis presented to the emergency department with fever and right flank pain with associated nausea, vomiting. Initial evaluation suggested right-sided pyelonephritis and patient was admitted for further evaluation.  Consultants:  None   Procedures:  None   Antibiotics:  Ceftriaxone 6/27 >> 6/29  Cefuroxime 6/30 >> 7/3  HPI/Subjective: Afebrile, vital signs stable. Excellent urine output. Feels much better. No nausea, vomiting. No significant flank pain or abdominal pain. Tolerating diet. Ate 1/2 roast beef sandwich last night. Headache has resolved. She would like to go home.  Objective: Filed Vitals:   08/06/12 0033 08/06/12 0208 08/06/12 0447 08/06/12 0955  BP:  126/71 122/85 139/86  Pulse:  74 72 77  Temp: 98.9 F (37.2 C) 98.2 F (36.8 C) 98.3 F (36.8 C) 98 F (36.7 C)  TempSrc: Oral Oral Oral Oral  Resp:  20 20 20   Height:      Weight:    66.8 kg (147 lb 4.3 oz)   SpO2:  97% 100% 100%    Intake/Output Summary (Last 24 hours) at 08/06/12 1218 Last data filed at 08/06/12 3086  Gross per 24 hour  Intake   1300 ml  Output   2550 ml  Net  -1250 ml     Filed Weights   08/04/12 0427 08/05/12 0407 08/06/12 0447  Weight: 74.4 kg (164 lb 0.4 oz) 68.8 kg (151 lb 10.8 oz) 66.8 kg (147 lb 4.3 oz)    Exam:   General: Appears calm and more comfortable today.  Cardiovascular regular rate and rhythm. No murmur, rub, gallop.   Respiratory clear to auscultation bilaterally. No wheezes, rales, rhonchi. Normal respiratory effort.  Abdomen soft, nontender, nondistended, no right-sided flank pain.  Data Reviewed:  No new data  Pending studies:   Urine culture--greater than 100,000 colonies Escherichia coli, sensitivities pending  Scheduled Meds: . cefTRIAXone (ROCEPHIN)  IV  1 g Intravenous Q24H  . chlorthalidone  25 mg Oral Daily  . docusate sodium  100 mg Oral BID  . enoxaparin (LOVENOX) injection  40 mg Subcutaneous Q24H  . fentaNYL  100 mcg Transdermal Q72H  . gabapentin  400 mg Oral TID  . pantoprazole  40 mg Oral Daily  . venlafaxine  75 mg Oral TID   Continuous Infusions:    Principal Problem:   Pyelonephritis Active Problems:   HTN (hypertension)   Back pain, chronic   HLD (hyperlipidemia)   Hypokalemia   Anemia

## 2012-08-06 NOTE — Care Management Note (Signed)
    Page 1 of 1   08/06/2012     10:54:50 AM   CARE MANAGEMENT NOTE 08/06/2012  Patient:  Amy Merritt, Amy Merritt   Account Number:  [id number]  Date Initiated:  08/06/2012  Documentation initiated by:  Sharrie Rothman  Subjective/Objective Assessment:   Pt admitted from home with pyelonephritis. Pt lives with her husband and will return home at discharge. Pt is independent with ADL's.     Action/Plan:   No CM needs noted. Pt is hopeful for discharge today.   Anticipated DC Date:  08/06/2012   Anticipated DC Plan:  HOME/SELF CARE      DC Planning Services  CM consult      Choice offered to / List presented to:             Status of service:  Completed, signed off Medicare Important Message given?   (If response is "NO", the following Medicare IM given date fields will be blank) Date Medicare IM given:   Date Additional Medicare IM given:    Discharge Disposition:  HOME/SELF CARE  Per UR Regulation:    If discussed at Long Length of Stay Meetings, dates discussed:    Comments:  08/06/12 1055 Arlyss Queen, RN BSN CM

## 2012-08-20 ENCOUNTER — Encounter: Payer: Self-pay | Admitting: Family Medicine

## 2012-08-20 ENCOUNTER — Ambulatory Visit (INDEPENDENT_AMBULATORY_CARE_PROVIDER_SITE_OTHER): Payer: BC Managed Care – PPO | Admitting: Family Medicine

## 2012-08-20 VITALS — BP 148/88 | HR 75 | Temp 97.3°F | Wt 150.2 lb

## 2012-08-20 DIAGNOSIS — R350 Frequency of micturition: Secondary | ICD-10-CM | POA: Insufficient documentation

## 2012-08-20 DIAGNOSIS — N12 Tubulo-interstitial nephritis, not specified as acute or chronic: Secondary | ICD-10-CM

## 2012-08-20 DIAGNOSIS — I10 Essential (primary) hypertension: Secondary | ICD-10-CM

## 2012-08-20 DIAGNOSIS — E876 Hypokalemia: Secondary | ICD-10-CM

## 2012-08-20 LAB — POCT CBC
Granulocyte percent: 69.7 %G (ref 37–80)
HCT, POC: 34.5 % — AB (ref 37.7–47.9)
Hemoglobin: 11.9 g/dL — AB (ref 12.2–16.2)
Lymph, poc: 2.1 (ref 0.6–3.4)
MCH, POC: 30.7 pg (ref 27–31.2)
MCHC: 34.5 g/dL (ref 31.8–35.4)
MCV: 89 fL (ref 80–97)
MPV: 7.3 fL (ref 0–99.8)
POC Granulocyte: 5.6 (ref 2–6.9)
POC LYMPH PERCENT: 26 %L (ref 10–50)
Platelet Count, POC: 446 10*3/uL — AB (ref 142–424)
RBC: 3.9 M/uL — AB (ref 4.04–5.48)
RDW, POC: 18.7 %
WBC: 8.1 10*3/uL (ref 4.6–10.2)

## 2012-08-20 LAB — POCT URINALYSIS DIPSTICK
Bilirubin, UA: NEGATIVE
Blood, UA: NEGATIVE
Glucose, UA: NEGATIVE
Ketones, UA: NEGATIVE
Nitrite, UA: NEGATIVE
Protein, UA: NEGATIVE
Spec Grav, UA: 1.015
Urobilinogen, UA: NEGATIVE
pH, UA: 5

## 2012-08-20 LAB — POCT UA - MICROSCOPIC ONLY
Bacteria, U Microscopic: NEGATIVE
Casts, Ur, LPF, POC: NEGATIVE
Crystals, Ur, HPF, POC: NEGATIVE
Mucus, UA: NEGATIVE
RBC, urine, microscopic: NEGATIVE
Yeast, UA: NEGATIVE

## 2012-08-20 MED ORDER — LISINOPRIL 10 MG PO TABS: 10.0000 mg | ORAL_TABLET | Freq: Every day | ORAL | Status: DC

## 2012-08-20 NOTE — Progress Notes (Signed)
Patient ID: Amy Merritt, female   DOB: 1960/06/03, 52 y.o.   MRN: [id number] SUBJECTIVE: CC: Chief Complaint  Patient presents with  . Hospitalization Follow-up    [place] PYELONEPHRITIS      HPI: Here for follow up of pyelonephritis. Has chronic pain and attends the pain management clinic. No fevers. Was hospitalized  At [facility] . Major follow up needs are to check for  Resolution of the pyelonephritis and hypokalemia. No dysuria or flank pain , no nausea and no vomiting.  Past Medical History  Diagnosis Date  . Headache(784.0)   . Neuromuscular disorder   . Arthritis   . PONV (postoperative nausea and vomiting)   . Lymphocytic colitis   . Depression   . IBS (irritable bowel syndrome)   . Chronic kidney disease   . Kidney stone    Past Surgical History  Procedure Laterality Date  . Back surgery      x 4- lower back  . Tubal ligation    . Lithotrpsy    . Neck surgery     History   Social History  . Marital Status: Married    Spouse Name: N/A    Number of Children: 2  . Years of Education: N/A   Occupational History  . Not on file.   Social History Main Topics  . Smoking status: Former Smoker    Quit date: 08/27/2010  . Smokeless tobacco: Never Used  . Alcohol Use: No  . Drug Use: No  . Sexually Active: Yes    Birth Control/ Protection: Surgical   Other Topics Concern  . Not on file   Social History Narrative  . No narrative on file   Family History  Problem Relation Age of Onset  . Cancer Father     ?  Marland Kitchen Colitis Father   . Heart defect Father   . Hypertension Father   . Diabetes Father   . Heart disease Mother   . Hypertension Mother   . Diabetes      multiple aunts  . Hypertension Brother   . Colon cancer Neg Hx   . Rectal cancer Neg Hx   . Stomach cancer Neg Hx    Current Outpatient Prescriptions on File Prior to Visit  Medication Sig Dispense Refill  . ALPRAZolam (XANAX) 1 MG tablet Take 1 mg by mouth 3 (three) times daily as needed.  ANXIETY        . chlorthalidone (HYGROTON) 25 MG tablet Take 1 tablet (25 mg total) by mouth daily.  90 tablet  3  . fentaNYL (DURAGESIC - DOSED MCG/HR) 100 MCG/HR Place 1 patch onto the skin every other day.        . gabapentin (NEURONTIN) 400 MG capsule Take 400 mg by mouth 3 (three) times daily.        . Glucosamine-Chondroitin (GLUCOSAMINE CHONDR COMPLEX PO) Take 1 tablet by mouth 3 (three) times daily.        . methocarbamol (ROBAXIN) 500 MG tablet Take 500 mg by mouth 3 (three) times daily as needed. MUSCLE SPASM        . oxycodone (ROXICODONE) 30 MG immediate release tablet Take 30 mg by mouth every 4 (four) hours as needed (Dr Thyra Breed). PAIN      . venlafaxine (EFFEXOR) 75 MG tablet Take 75 mg by mouth 3 (three) times daily.        . Ibuprofen-Famotidine (DUEXIS) 800-26.6 MG TABS Take 1 tablet by mouth 3 (three) times daily.  No current facility-administered medications on file prior to visit.   Allergies  Allergen Reactions  . Morphine And Related Itching and Swelling    There is no immunization history on file for this patient. Prior to Admission medications   Medication Sig Start Date End Date Taking? Authorizing Provider  ALPRAZolam Prudy Feeler) 1 MG tablet Take 1 mg by mouth 3 (three) times daily as needed. ANXIETY     Yes Historical Provider, MD  chlorthalidone (HYGROTON) 25 MG tablet Take 1 tablet (25 mg total) by mouth daily. 05/21/12  Yes Ileana Ladd, MD  fentaNYL (DURAGESIC - DOSED MCG/HR) 100 MCG/HR Place 1 patch onto the skin every other day.     Yes Historical Provider, MD  gabapentin (NEURONTIN) 400 MG capsule Take 400 mg by mouth 3 (three) times daily.     Yes Historical Provider, MD  Glucosamine-Chondroitin (GLUCOSAMINE CHONDR COMPLEX PO) Take 1 tablet by mouth 3 (three) times daily.     Yes Historical Provider, MD  methocarbamol (ROBAXIN) 500 MG tablet Take 500 mg by mouth 3 (three) times daily as needed. MUSCLE SPASM     Yes Historical Provider, MD   oxycodone (ROXICODONE) 30 MG immediate release tablet Take 30 mg by mouth every 4 (four) hours as needed (Dr Thyra Breed). PAIN   Yes Historical Provider, MD  venlafaxine (EFFEXOR) 75 MG tablet Take 75 mg by mouth 3 (three) times daily.     Yes Historical Provider, MD  Ibuprofen-Famotidine (DUEXIS) 800-26.6 MG TABS Take 1 tablet by mouth 3 (three) times daily.    Historical Provider, MD     ROS: As above in the HPI. All other systems are stable or negative.  OBJECTIVE: APPEARANCE:  Patient in no acute distress.The patient appeared well nourished and normally developed. Acyanotic. Waist: VITAL SIGNS:BP 148/88  Pulse 75  Temp(Src) 97.3 F (36.3 C) (Oral)  Wt 150 lb 3.2 oz (68.13 kg)  BMI 28.39 kg/m2  LMP 07/13/2012 WF  SKIN: warm and  Dry   HEAD and Neck: without JVD, Head and scalp: normal Eyes:No scleral icterus. Fundi normal, eye movements normal. Ears: Auricle normal, canal normal, Tympanic membranes normal, insufflation normal. Nose: normal Throat: normal Neck & thyroid: normal  CHEST & LUNGS: Chest wall: normal Lungs: Clear  CVS: Reveals the PMI to be normally located. Regular rhythm, First and Second Heart sounds are normal,  absence of murmurs, rubs or gallops. Peripheral vasculature: Radial pulses: normal Dorsal pedis pulses: normal Posterior pulses: normal  ABDOMEN:  Appearance: normal Benign, no organomegaly, no masses, no Abdominal Aortic enlargement. No Guarding , no rebound. No Bruits. Bowel sounds: normal. No CVA tenderness  RECTAL: N/A GU: N/A  EXTREMETIES: nonedematous.  MUSCULOSKELETAL:  Complains of myalgias and joint and back pain. Reduced ROM of the spine.  NEUROLOGIC: oriented to time,place and person; nonfocal.  ASSESSMENT: Frequency of urination - Plan: POCT urinalysis dipstick, POCT UA - Microscopic Only  Pyelonephritis - Plan: POCT CBC, Urine culture  Hypokalemia - Plan: BASIC METABOLIC PANEL WITH GFR  HTN (hypertension)  - Plan: lisinopril (PRINIVIL,ZESTRIL) 10 MG tablet  PLAN: Orders Placed This Encounter  Procedures  . Urine culture  . BASIC METABOLIC PANEL WITH GFR  . POCT urinalysis dipstick  . POCT UA - Microscopic Only  . POCT CBC    Meds ordered this encounter  Medications  . lisinopril (PRINIVIL,ZESTRIL) 10 MG tablet    Sig: Take 1 tablet (10 mg total) by mouth daily.    Dispense:  90 tablet  Refill:  3   Medications Discontinued During This Encounter  Medication Reason  . cefUROXime (CEFTIN) 500 MG tablet Completed Course  . ondansetron (ZOFRAN) 4 MG tablet Completed Course  . amphetamine-dextroamphetamine (ADDERALL) 10 MG tablet Patient Preference  . chlorthalidone (HYGROTON) 25 MG tablet Side effect (s)   Results for orders placed in visit on 08/20/12  POCT URINALYSIS DIPSTICK      Result Value Range   Color, UA yellow     Clarity, UA clear     Glucose, UA neg     Bilirubin, UA neg     Ketones, UA neg     Spec Grav, UA 1.015     Blood, UA neg     pH, UA 5.0     Protein, UA neg     Urobilinogen, UA negative     Nitrite, UA neg     Leukocytes, UA Trace    POCT UA - MICROSCOPIC ONLY      Result Value Range   WBC, Ur, HPF, POC 1-5     RBC, urine, microscopic neg     Bacteria, U Microscopic neg     Mucus, UA neg     Epithelial cells, urine per micros occ     Crystals, Ur, HPF, POC neg     Casts, Ur, LPF, POC neg     Yeast, UA neg    POCT CBC      Result Value Range   WBC 8.1  4.6 - 10.2 K/uL   Lymph, poc 2.1  0.6 - 3.4   POC LYMPH PERCENT 26.0  10 - 50 %L   POC Granulocyte 5.6  2 - 6.9   Granulocyte percent 69.7  37 - 80 %G   RBC 3.9 (*) 4.04 - 5.48 M/uL   Hemoglobin 11.9 (*) 12.2 - 16.2 g/dL   HCT, POC 96.0 (*) 45.4 - 47.9 %   MCV 89.0  80 - 97 fL   MCH, POC 30.7  27 - 31.2 pg   MCHC 34.5  31.8 - 35.4 g/dL   RDW, POC 09.8     Platelet Count, POC 446.0 (*) 142 - 424 K/uL   MPV 7.3  0 - 99.8 fL   Return in about 4 weeks (around 09/17/2012) for recheck  BP.  Oaklee Sunga P. [place] Charon, M.D.

## 2012-08-21 LAB — BASIC METABOLIC PANEL WITH GFR
BUN: 12 mg/dL (ref 6–23)
CO2: 27 mEq/L (ref 19–32)
Calcium: 9.1 mg/dL (ref 8.4–10.5)
Chloride: 101 mEq/L (ref 96–112)
Creat: 1.1 mg/dL (ref 0.50–1.10)
GFR, Est African American: 68 mL/min
GFR, Est Non African American: 59 mL/min — ABNORMAL LOW
Glucose, Bld: 74 mg/dL (ref 70–99)
Potassium: 5.4 mEq/L — ABNORMAL HIGH (ref 3.5–5.3)
Sodium: 137 mEq/L (ref 135–145)

## 2012-08-23 ENCOUNTER — Telehealth: Payer: Self-pay | Admitting: Family Medicine

## 2012-08-23 LAB — URINE CULTURE

## 2012-08-24 ENCOUNTER — Telehealth: Payer: Self-pay | Admitting: Family Medicine

## 2012-08-24 ENCOUNTER — Other Ambulatory Visit: Payer: Self-pay | Admitting: Family Medicine

## 2012-08-24 DIAGNOSIS — N12 Tubulo-interstitial nephritis, not specified as acute or chronic: Secondary | ICD-10-CM

## 2012-08-24 MED ORDER — CEPHALEXIN 500 MG PO CAPS: 500.0000 mg | ORAL_CAPSULE | Freq: Four times a day (QID) | ORAL | Status: DC

## 2012-08-24 NOTE — Telephone Encounter (Signed)
Spoke with spouse and lab results given .

## 2012-08-24 NOTE — Telephone Encounter (Signed)
Spouse aware of labs. 

## 2012-08-24 NOTE — Progress Notes (Signed)
Quick Note:  Labs abnormal. Urine culture just came back yesterday. Takes 3 days to culture. Some group B strep in urine. And her Potassium is now corrected and borderline high. Will reck potassium in 4 weeks. Will treat with Keflex for the bacteria in the urine even though it was a low count. Recheck in 4 weeks as planned ______

## 2012-09-21 ENCOUNTER — Encounter: Payer: Self-pay | Admitting: Gastroenterology

## 2012-09-26 ENCOUNTER — Encounter: Payer: Self-pay | Admitting: Gastroenterology

## 2012-10-18 ENCOUNTER — Ambulatory Visit: Payer: BC Managed Care – PPO | Admitting: Family Medicine

## 2012-10-22 ENCOUNTER — Other Ambulatory Visit: Payer: Self-pay

## 2012-10-22 MED ORDER — PANTOPRAZOLE SODIUM 40 MG PO TBEC: 40.0000 mg | DELAYED_RELEASE_TABLET | Freq: Every day | ORAL | Status: DC

## 2012-10-26 ENCOUNTER — Encounter: Payer: Self-pay | Admitting: Family Medicine

## 2012-10-26 ENCOUNTER — Ambulatory Visit (INDEPENDENT_AMBULATORY_CARE_PROVIDER_SITE_OTHER): Payer: BC Managed Care – PPO

## 2012-10-26 ENCOUNTER — Ambulatory Visit (INDEPENDENT_AMBULATORY_CARE_PROVIDER_SITE_OTHER): Payer: BC Managed Care – PPO | Admitting: Family Medicine

## 2012-10-26 VITALS — BP 144/77 | HR 68 | Temp 98.5°F | Wt 155.6 lb

## 2012-10-26 DIAGNOSIS — R52 Pain, unspecified: Secondary | ICD-10-CM

## 2012-10-26 DIAGNOSIS — R109 Unspecified abdominal pain: Secondary | ICD-10-CM

## 2012-10-26 DIAGNOSIS — R10A0 Flank pain, unspecified side: Secondary | ICD-10-CM

## 2012-10-26 DIAGNOSIS — I10 Essential (primary) hypertension: Secondary | ICD-10-CM

## 2012-10-26 DIAGNOSIS — R350 Frequency of micturition: Secondary | ICD-10-CM

## 2012-10-26 DIAGNOSIS — F988 Other specified behavioral and emotional disorders with onset usually occurring in childhood and adolescence: Secondary | ICD-10-CM

## 2012-10-26 DIAGNOSIS — E785 Hyperlipidemia, unspecified: Secondary | ICD-10-CM

## 2012-10-26 LAB — POCT UA - MICROSCOPIC ONLY
Casts, Ur, LPF, POC: NEGATIVE
Crystals, Ur, HPF, POC: NEGATIVE
Mucus, UA: NEGATIVE
Yeast, UA: NEGATIVE

## 2012-10-26 LAB — POCT URINALYSIS DIPSTICK
Bilirubin, UA: NEGATIVE
Glucose, UA: NEGATIVE
Ketones, UA: NEGATIVE
Leukocytes, UA: NEGATIVE
Nitrite, UA: NEGATIVE
Protein, UA: NEGATIVE
Spec Grav, UA: 1.015
Urobilinogen, UA: NEGATIVE
pH, UA: 5

## 2012-10-26 MED ORDER — LISINOPRIL 20 MG PO TABS: 20.0000 mg | ORAL_TABLET | Freq: Every day | ORAL | Status: DC

## 2012-10-26 MED ORDER — SULFAMETHOXAZOLE-TMP DS 800-160 MG PO TABS: 1.0000 | ORAL_TABLET | Freq: Two times a day (BID) | ORAL | Status: DC

## 2012-10-26 NOTE — Progress Notes (Signed)
Patient ID: Amy Merritt, female   DOB: 18-Oct-1960, 52 y.o.   MRN: [id number] SUBJECTIVE: CC: Chief Complaint  Patient presents with  . Follow-up    1 month     HPI:   Past Medical History  Diagnosis Date  . Headache(784.0)   . Neuromuscular disorder   . Arthritis   . PONV (postoperative nausea and vomiting)   . Lymphocytic colitis   . Depression   . IBS (irritable bowel syndrome)   . Chronic kidney disease   . Kidney stone    Past Surgical History  Procedure Laterality Date  . Back surgery      x 4- lower back  . Tubal ligation    . Lithotrpsy    . Neck surgery     History   Social History  . Marital Status: Married    Spouse Name: N/A    Number of Children: 2  . Years of Education: N/A   Occupational History  . Not on file.   Social History Main Topics  . Smoking status: Former Smoker    Quit date: 08/27/2010  . Smokeless tobacco: Never Used  . Alcohol Use: No  . Drug Use: No  . Sexual Activity: Yes    Birth Control/ Protection: Surgical   Other Topics Concern  . Not on file   Social History Narrative  . No narrative on file   Family History  Problem Relation Age of Onset  . Cancer Father     ?  Marland Kitchen Colitis Father   . Heart defect Father   . Hypertension Father   . Diabetes Father   . Heart disease Mother   . Hypertension Mother   . Diabetes      multiple aunts  . Hypertension Brother   . Colon cancer Neg Hx   . Rectal cancer Neg Hx   . Stomach cancer Neg Hx    Current Outpatient Prescriptions on File Prior to Visit  Medication Sig Dispense Refill  . ALPRAZolam (XANAX) 1 MG tablet Take 1 mg by mouth 3 (three) times daily as needed. ANXIETY        . cephALEXin (KEFLEX) 500 MG capsule Take 1 capsule (500 mg total) by mouth 4 (four) times daily.  40 capsule  0  . fentaNYL (DURAGESIC - DOSED MCG/HR) 100 MCG/HR Place 1 patch onto the skin every other day.        . gabapentin (NEURONTIN) 400 MG capsule Take 400 mg by mouth 3 (three) times  daily.        . Glucosamine-Chondroitin (GLUCOSAMINE CHONDR COMPLEX PO) Take 1 tablet by mouth 3 (three) times daily.        . Ibuprofen-Famotidine (DUEXIS) 800-26.6 MG TABS Take 1 tablet by mouth 3 (three) times daily.      Marland Kitchen lisinopril (PRINIVIL,ZESTRIL) 10 MG tablet Take 1 tablet (10 mg total) by mouth daily.  90 tablet  3  . methocarbamol (ROBAXIN) 500 MG tablet Take 500 mg by mouth 3 (three) times daily as needed. MUSCLE SPASM        . oxycodone (ROXICODONE) 30 MG immediate release tablet Take 30 mg by mouth every 4 (four) hours as needed (Dr Thyra Breed). PAIN      . pantoprazole (PROTONIX) 40 MG tablet Take 1 tablet (40 mg total) by mouth daily.  90 tablet  0  . venlafaxine (EFFEXOR) 75 MG tablet Take 75 mg by mouth 3 (three) times daily.         No  current facility-administered medications on file prior to visit.   Allergies  Allergen Reactions  . Morphine And Related Itching and Swelling    There is no immunization history on file for this patient. Prior to Admission medications   Medication Sig Start Date End Date Taking? Authorizing Provider  ALPRAZolam Prudy Feeler) 1 MG tablet Take 1 mg by mouth 3 (three) times daily as needed. ANXIETY     Yes Historical Provider, MD  cephALEXin (KEFLEX) 500 MG capsule Take 1 capsule (500 mg total) by mouth 4 (four) times daily. 08/24/12  Yes Ileana Ladd, MD  fentaNYL (DURAGESIC - DOSED MCG/HR) 100 MCG/HR Place 1 patch onto the skin every other day.     Yes Historical Provider, MD  gabapentin (NEURONTIN) 400 MG capsule Take 400 mg by mouth 3 (three) times daily.     Yes Historical Provider, MD  Glucosamine-Chondroitin (GLUCOSAMINE CHONDR COMPLEX PO) Take 1 tablet by mouth 3 (three) times daily.     Yes Historical Provider, MD  Ibuprofen-Famotidine (DUEXIS) 800-26.6 MG TABS Take 1 tablet by mouth 3 (three) times daily.   Yes Historical Provider, MD  lisinopril (PRINIVIL,ZESTRIL) 10 MG tablet Take 1 tablet (10 mg total) by mouth daily. 08/20/12   Yes Ileana Ladd, MD  methocarbamol (ROBAXIN) 500 MG tablet Take 500 mg by mouth 3 (three) times daily as needed. MUSCLE SPASM     Yes Historical Provider, MD  oxycodone (ROXICODONE) 30 MG immediate release tablet Take 30 mg by mouth every 4 (four) hours as needed (Dr Thyra Breed). PAIN   Yes Historical Provider, MD  pantoprazole (PROTONIX) 40 MG tablet Take 1 tablet (40 mg total) by mouth daily. 10/22/12  Yes Meryl Dare, MD  venlafaxine (EFFEXOR) 75 MG tablet Take 75 mg by mouth 3 (three) times daily.     Yes Historical Provider, MD     ROS: As above in the HPI. All other systems are stable or negative.  OBJECTIVE: APPEARANCE:  Patient in no acute distress.The patient appeared well nourished and normally developed. Acyanotic. Waist: VITAL SIGNS:  SKIN: warm and  Dry without overt rashes, tattoos and scars  HEAD and Neck: without JVD, Head and scalp: normal Eyes:No scleral icterus. Fundi normal, eye movements normal. Ears: Auricle normal, canal normal, Tympanic membranes normal, insufflation normal. Nose: normal Throat: normal Neck & thyroid: normal  CHEST & LUNGS: Chest wall: normal Lungs: Clear  CVS: Reveals the PMI to be normally located. Regular rhythm, First and Second Heart sounds are normal,  absence of murmurs, rubs or gallops. Peripheral vasculature: Radial pulses: normal Dorsal pedis pulses: normal Posterior pulses: normal  ABDOMEN:  Appearance: normal Benign, no organomegaly, no masses, no Abdominal Aortic enlargement. No Guarding , no rebound. No Bruits. Bowel sounds: normal  RECTAL: N/A GU: N/A  EXTREMETIES: nonedematous.  MUSCULOSKELETAL:  Spine: normal Joints: intact  NEUROLOGIC: oriented to time,place and person; nonfocal. Strength is normal Sensory is normal Reflexes are normal Cranial Nerves are normal.  ASSESSMENT:  PLAN:

## 2012-10-29 NOTE — Progress Notes (Signed)
Patient ID: Amy Merritt, female   DOB: 06-May-1960, 52 y.o.   MRN: [id number] See H&P was incomplete and somehow was closed.  SUBJECTIVE: CC: Chief Complaint  Patient presents with  . Follow-up    1 month     HPI: Complaining of left flank pain persistent and wonders if it is her kidney infection. No fever , no chills. No nausea , no vomiting. Has had multiple back surgery.  Past Medical History  Diagnosis Date  . Headache(784.0)   . Neuromuscular disorder   . Arthritis   . PONV (postoperative nausea and vomiting)   . Lymphocytic colitis   . Depression   . IBS (irritable bowel syndrome)   . Chronic kidney disease   . Kidney stone    Past Surgical History  Procedure Laterality Date  . Back surgery      x 4- lower back  . Tubal ligation    . Lithotrpsy    . Neck surgery     History   Social History  . Marital Status: Married    Spouse Name: N/A    Number of Children: 2  . Years of Education: N/A   Occupational History  . Not on file.   Social History Main Topics  . Smoking status: Former Smoker    Quit date: 08/27/2010  . Smokeless tobacco: Never Used  . Alcohol Use: No  . Drug Use: No  . Sexual Activity: Yes    Birth Control/ Protection: Surgical   Other Topics Concern  . Not on file   Social History Narrative  . No narrative on file   Family History  Problem Relation Age of Onset  . Cancer Father     ?  Marland Kitchen Colitis Father   . Heart defect Father   . Hypertension Father   . Diabetes Father   . Heart disease Mother   . Hypertension Mother   . Diabetes      multiple aunts  . Hypertension Brother   . Colon cancer Neg Hx   . Rectal cancer Neg Hx   . Stomach cancer Neg Hx    Current Outpatient Prescriptions on File Prior to Visit  Medication Sig Dispense Refill  . ALPRAZolam (XANAX) 1 MG tablet Take 1 mg by mouth 3 (three) times daily as needed. ANXIETY        . fentaNYL (DURAGESIC - DOSED MCG/HR) 100 MCG/HR Place 1 patch onto the skin every  other day.        . gabapentin (NEURONTIN) 400 MG capsule Take 400 mg by mouth 3 (three) times daily.        . Glucosamine-Chondroitin (GLUCOSAMINE CHONDR COMPLEX PO) Take 1 tablet by mouth 3 (three) times daily.        . Ibuprofen-Famotidine (DUEXIS) 800-26.6 MG TABS Take 1 tablet by mouth 3 (three) times daily.      . methocarbamol (ROBAXIN) 500 MG tablet Take 500 mg by mouth 3 (three) times daily as needed. MUSCLE SPASM        . oxycodone (ROXICODONE) 30 MG immediate release tablet Take 30 mg by mouth every 4 (four) hours as needed (Dr Thyra Breed). PAIN      . pantoprazole (PROTONIX) 40 MG tablet Take 1 tablet (40 mg total) by mouth daily.  90 tablet  0  . venlafaxine (EFFEXOR) 75 MG tablet Take 75 mg by mouth 3 (three) times daily.         No current facility-administered medications on file prior to visit.  Allergies  Allergen Reactions  . Morphine And Related Itching and Swelling    There is no immunization history on file for this patient. Prior to Admission medications   Medication Sig Start Date End Date Taking? Authorizing Provider  ALPRAZolam Prudy Feeler) 1 MG tablet Take 1 mg by mouth 3 (three) times daily as needed. ANXIETY     Yes Historical Provider, MD  fentaNYL (DURAGESIC - DOSED MCG/HR) 100 MCG/HR Place 1 patch onto the skin every other day.     Yes Historical Provider, MD  gabapentin (NEURONTIN) 400 MG capsule Take 400 mg by mouth 3 (three) times daily.     Yes Historical Provider, MD  Glucosamine-Chondroitin (GLUCOSAMINE CHONDR COMPLEX PO) Take 1 tablet by mouth 3 (three) times daily.     Yes Historical Provider, MD  Ibuprofen-Famotidine (DUEXIS) 800-26.6 MG TABS Take 1 tablet by mouth 3 (three) times daily.   Yes Historical Provider, MD  lisinopril (PRINIVIL,ZESTRIL) 20 MG tablet Take 1 tablet (20 mg total) by mouth daily. 10/26/12  Yes Ileana Ladd, MD  methocarbamol (ROBAXIN) 500 MG tablet Take 500 mg by mouth 3 (three) times daily as needed. MUSCLE SPASM     Yes  Historical Provider, MD  oxycodone (ROXICODONE) 30 MG immediate release tablet Take 30 mg by mouth every 4 (four) hours as needed (Dr Thyra Breed). PAIN   Yes Historical Provider, MD  pantoprazole (PROTONIX) 40 MG tablet Take 1 tablet (40 mg total) by mouth daily. 10/22/12  Yes Meryl Dare, MD  venlafaxine (EFFEXOR) 75 MG tablet Take 75 mg by mouth 3 (three) times daily.     Yes Historical Provider, MD  sulfamethoxazole-trimethoprim (BACTRIM DS) 800-160 MG per tablet Take 1 tablet by mouth 2 (two) times daily. 10/26/12   Ileana Ladd, MD     ROS: As above in the HPI. All other systems are stable or negative.  OBJECTIVE: APPEARANCE:  Patient in no acute distress.The patient appeared well nourished and normally developed. Acyanotic. Waist: VITAL SIGNS:BP 144/77  Pulse 68  Temp(Src) 98.5 F (36.9 C) (Oral)  Wt 155 lb 9.6 oz (70.58 kg)  BMI 29.42 kg/m2  LMP 10/15/2012  WF Uncomfortable to  Lie back and to sit for prolonged period.  SKIN: warm and  Dry without overt rashes, tattoos and scars  HEAD and Neck: without JVD, Head and scalp: normal Eyes:No scleral icterus. Fundi normal, eye movements normal. Ears: Auricle normal, canal normal, Tympanic membranes normal, insufflation normal. Nose: normal Throat: normal Neck & thyroid: normal  CHEST & LUNGS: Chest wall: normal Lungs: Clear  CVS: Reveals the PMI to be normally located. Regular rhythm, First and Second Heart sounds are normal,  absence of murmurs, rubs or gallops. Peripheral vasculature: Radial pulses: normal Dorsal pedis pulses: normal Posterior pulses: normal  ABDOMEN:  Appearance: normal, no organomegaly, no masses, no Abdominal Aortic enlargement. No Guarding , no rebound. No Bruits.left CVA tenderness Bowel sounds: normal  RECTAL: N/A GU: N/A  EXTREMETIES: nonedematous.  MUSCULOSKELETAL:  Spine: surgical scar of the lumbar area. Spine has reduced ROM. SLR positive. Joints:  intact  NEUROLOGIC: oriented to time,place and person; nonfocal.  ASSESSMENT: Acute flank pain - Plan: DG Abd 1 View, Urine culture  Frequency of urination - Plan: POCT UA - Microscopic Only, POCT urinalysis dipstick, Urine culture, sulfamethoxazole-trimethoprim (BACTRIM DS) 800-160 MG per tablet  ADD (attention deficit disorder)  HLD (hyperlipidemia)  HTN (hypertension) - Plan: lisinopril (PRINIVIL,ZESTRIL) 20 MG tablet   PLAN: [facility] reading (PRIMARY) by  Dr. Modesto Charon:  LUmbar spine surgical appliances in place. Lots of stools in the bowel. Difficult to see if there are any stones in the left urinary tract area. Await official reading. Which eventally came back over the weekend.  Orders Placed This Encounter  Procedures  . Urine culture  . DG Abd 1 View    Standing Status: Future     Number of Occurrences: 1     Standing Expiration Date: 12/26/2013    Order Specific Question:  Reason for Exam (SYMPTOM  OR DIAGNOSIS REQUIRED)    Answer:  left CVA tenderness and pain. ureteric  colic.    Order Specific Question:  Is the patient pregnant?    Answer:  No    Order Specific Question:  Preferred imaging location?    Answer:  Internal  . POCT UA - Microscopic Only  . POCT urinalysis dipstick   Results for orders placed in visit on 10/26/12  POCT UA - MICROSCOPIC ONLY      Result Value Range   WBC, Ur, HPF, POC occ     RBC, urine, microscopic occ     Bacteria, U Microscopic occ     Mucus, UA neg     Epithelial cells, urine per micros occ     Crystals, Ur, HPF, POC neg     Casts, Ur, LPF, POC neg     Yeast, UA neg    POCT URINALYSIS DIPSTICK      Result Value Range   Color, UA yellow     Clarity, UA clear     Glucose, UA neg     Bilirubin, UA neg     Ketones, UA neg     Spec Grav, UA 1.015     Blood, UA trace     pH, UA 5.0     Protein, UA neg     Urobilinogen, UA negative     Nitrite, UA neg     Leukocytes, UA Negative     Meds ordered this encounter  Medications  .  DISCONTD: cefUROXime (CEFTIN) 500 MG tablet    Sig:   . lisinopril (PRINIVIL,ZESTRIL) 20 MG tablet    Sig: Take 1 tablet (20 mg total) by mouth daily.    Dispense:  90 tablet    Refill:  3  . sulfamethoxazole-trimethoprim (BACTRIM DS) 800-160 MG per tablet    Sig: Take 1 tablet by mouth 2 (two) times daily.    Dispense:  20 tablet    Refill:  0   Return in about 5 days (around 10/31/2012) for Recheck medical problems.  Drae Mitzel P. Modesto Charon, M.D.

## 2012-10-31 LAB — URINE CULTURE

## 2012-10-31 NOTE — Progress Notes (Signed)
Quick Note:  Call patient. Urine culture was okay. Did not grow a bacteria. No change in plan.follow up as planned. ______

## 2012-11-02 ENCOUNTER — Encounter: Payer: Self-pay | Admitting: Family Medicine

## 2012-11-02 ENCOUNTER — Ambulatory Visit (INDEPENDENT_AMBULATORY_CARE_PROVIDER_SITE_OTHER): Payer: BC Managed Care – PPO | Admitting: Family Medicine

## 2012-11-02 VITALS — BP 121/89 | HR 76 | Temp 97.0°F | Ht 63.0 in | Wt 153.6 lb

## 2012-11-02 DIAGNOSIS — M549 Dorsalgia, unspecified: Secondary | ICD-10-CM

## 2012-11-02 DIAGNOSIS — I10 Essential (primary) hypertension: Secondary | ICD-10-CM

## 2012-11-02 DIAGNOSIS — R109 Unspecified abdominal pain: Secondary | ICD-10-CM | POA: Insufficient documentation

## 2012-11-02 DIAGNOSIS — E785 Hyperlipidemia, unspecified: Secondary | ICD-10-CM

## 2012-11-02 DIAGNOSIS — H60399 Other infective otitis externa, unspecified ear: Secondary | ICD-10-CM

## 2012-11-02 DIAGNOSIS — G8929 Other chronic pain: Secondary | ICD-10-CM

## 2012-11-02 DIAGNOSIS — H60391 Other infective otitis externa, right ear: Secondary | ICD-10-CM

## 2012-11-02 LAB — POCT UA - MICROSCOPIC ONLY
Bacteria, U Microscopic: NEGATIVE
Casts, Ur, LPF, POC: NEGATIVE
Crystals, Ur, HPF, POC: NEGATIVE
Mucus, UA: NEGATIVE
RBC, urine, microscopic: NEGATIVE
WBC, Ur, HPF, POC: NEGATIVE
Yeast, UA: NEGATIVE

## 2012-11-02 LAB — POCT URINALYSIS DIPSTICK
Bilirubin, UA: NEGATIVE
Blood, UA: NEGATIVE
Glucose, UA: NEGATIVE
Ketones, UA: NEGATIVE
Leukocytes, UA: NEGATIVE
Nitrite, UA: NEGATIVE
Protein, UA: NEGATIVE
Spec Grav, UA: 1.01
Urobilinogen, UA: NEGATIVE
pH, UA: 6

## 2012-11-02 MED ORDER — NEOMYCIN-POLYMYXIN-HC 1 % OT SOLN: 3.0000 [drp] | OTIC | Status: DC

## 2012-11-02 NOTE — Progress Notes (Signed)
Patient ID: Amy Merritt, female   DOB: October 23, 1960, 52 y.o.   MRN: [id number] SUBJECTIVE: CC: Chief Complaint  Patient presents with  . Follow-up    reck flank pain     HPI: A little better. But it still hurts in the left flank. No obvious hematuria. No stone passed. No fever, no nausea , no vomiting.  Past Medical History  Diagnosis Date  . Headache(784.0)   . Neuromuscular disorder   . Arthritis   . PONV (postoperative nausea and vomiting)   . Lymphocytic colitis   . Depression   . IBS (irritable bowel syndrome)   . Chronic kidney disease   . Kidney stone    Past Surgical History  Procedure Laterality Date  . Back surgery      x 4- lower back  . Tubal ligation    . Lithotrpsy    . Neck surgery     History   Social History  . Marital Status: Married    Spouse Name: N/A    Number of Children: 2  . Years of Education: N/A   Occupational History  . Not on file.   Social History Main Topics  . Smoking status: Former Smoker    Quit date: 08/27/2010  . Smokeless tobacco: Never Used  . Alcohol Use: No  . Drug Use: No  . Sexual Activity: Yes    Birth Control/ Protection: Surgical   Other Topics Concern  . Not on file   Social History Narrative  . No narrative on file   Family History  Problem Relation Age of Onset  . Cancer Father     ?  Marland Kitchen Colitis Father   . Heart defect Father   . Hypertension Father   . Diabetes Father   . Heart disease Mother   . Hypertension Mother   . Diabetes      multiple aunts  . Hypertension Brother   . Colon cancer Neg Hx   . Rectal cancer Neg Hx   . Stomach cancer Neg Hx    Current Outpatient Prescriptions on File Prior to Visit  Medication Sig Dispense Refill  . ALPRAZolam (XANAX) 1 MG tablet Take 1 mg by mouth 3 (three) times daily as needed. ANXIETY        . fentaNYL (DURAGESIC - DOSED MCG/HR) 100 MCG/HR Place 1 patch onto the skin every other day.        . gabapentin (NEURONTIN) 400 MG capsule Take 400 mg by  mouth 3 (three) times daily.        . Glucosamine-Chondroitin (GLUCOSAMINE CHONDR COMPLEX PO) Take 1 tablet by mouth 3 (three) times daily.        . Ibuprofen-Famotidine (DUEXIS) 800-26.6 MG TABS Take 1 tablet by mouth 3 (three) times daily.      Marland Kitchen lisinopril (PRINIVIL,ZESTRIL) 20 MG tablet Take 1 tablet (20 mg total) by mouth daily.  90 tablet  3  . methocarbamol (ROBAXIN) 500 MG tablet Take 500 mg by mouth 3 (three) times daily as needed. MUSCLE SPASM        . oxycodone (ROXICODONE) 30 MG immediate release tablet Take 30 mg by mouth every 4 (four) hours as needed (Dr Thyra Breed). PAIN      . pantoprazole (PROTONIX) 40 MG tablet Take 1 tablet (40 mg total) by mouth daily.  90 tablet  0  . sulfamethoxazole-trimethoprim (BACTRIM DS) 800-160 MG per tablet Take 1 tablet by mouth 2 (two) times daily.  20 tablet  0  .  venlafaxine (EFFEXOR) 75 MG tablet Take 75 mg by mouth 3 (three) times daily.         No current facility-administered medications on file prior to visit.   Allergies  Allergen Reactions  . Morphine And Related Itching and Swelling    There is no immunization history on file for this patient. Prior to Admission medications   Medication Sig Start Date End Date Taking? Authorizing Provider  ALPRAZolam Prudy Feeler) 1 MG tablet Take 1 mg by mouth 3 (three) times daily as needed. ANXIETY     Yes Historical Provider, MD  fentaNYL (DURAGESIC - DOSED MCG/HR) 100 MCG/HR Place 1 patch onto the skin every other day.     Yes Historical Provider, MD  gabapentin (NEURONTIN) 400 MG capsule Take 400 mg by mouth 3 (three) times daily.     Yes Historical Provider, MD  Glucosamine-Chondroitin (GLUCOSAMINE CHONDR COMPLEX PO) Take 1 tablet by mouth 3 (three) times daily.     Yes Historical Provider, MD  Ibuprofen-Famotidine (DUEXIS) 800-26.6 MG TABS Take 1 tablet by mouth 3 (three) times daily.   Yes Historical Provider, MD  lisinopril (PRINIVIL,ZESTRIL) 20 MG tablet Take 1 tablet (20 mg total) by mouth  daily. 10/26/12  Yes Ileana Ladd, MD  methocarbamol (ROBAXIN) 500 MG tablet Take 500 mg by mouth 3 (three) times daily as needed. MUSCLE SPASM     Yes Historical Provider, MD  oxycodone (ROXICODONE) 30 MG immediate release tablet Take 30 mg by mouth every 4 (four) hours as needed (Dr Thyra Breed). PAIN   Yes Historical Provider, MD  pantoprazole (PROTONIX) 40 MG tablet Take 1 tablet (40 mg total) by mouth daily. 10/22/12  Yes Meryl Dare, MD  sulfamethoxazole-trimethoprim (BACTRIM DS) 800-160 MG per tablet Take 1 tablet by mouth 2 (two) times daily. 10/26/12  Yes Ileana Ladd, MD  venlafaxine (EFFEXOR) 75 MG tablet Take 75 mg by mouth 3 (three) times daily.     Yes Historical Provider, MD     ROS: As above in the HPI. All other systems are stable or negative.  OBJECTIVE: APPEARANCE:  Patient in no acute distress.The patient appeared well nourished and normally developed. Acyanotic. Waist: VITAL SIGNS:BP 121/89  Pulse 76  Temp(Src) 97 F (36.1 C) (Oral)  Ht 5\' 3"  (1.6 m)  Wt 153 lb 9.6 oz (69.673 kg)  BMI 27.22 kg/m2  LMP 10/15/2012 WF  SKIN: warm and  Dry without overt rashes, tattoos and scars  HEAD and Neck: without JVD, Head and scalp: normal Eyes:No scleral icterus. Fundi normal, eye movements normal. Ears: Auricle normal, Right external canal has a red inflamed floor with a papule ,  Tympanic membranes normal, insufflation normal. Nose: normal Throat: normal Neck & thyroid: normal  CHEST & LUNGS: Chest wall: normal Lungs: Clear  CVS: Reveals the PMI to be normally located. Regular rhythm, First and Second Heart sounds are normal,  absence of murmurs, rubs or gallops. Peripheral vasculature: Radial pulses: normal Dorsal pedis pulses: normal Posterior pulses: normal  ABDOMEN:  Appearance: normal, mild left CVA tenderness Benign, no organomegaly, no masses, no Abdominal Aortic enlargement. No Guarding , no rebound. No Bruits. Bowel sounds:  normal  RECTAL: N/A GU: N/A  EXTREMETIES: nonedematous.  MUSCULOSKELETAL:  Spine: rigid back pain on twisting mild Joints: intact  NEUROLOGIC: oriented to time,place and person; nonfocal. Results for orders placed in visit on 11/02/12  POCT URINALYSIS DIPSTICK      Result Value Range   Color, UA yellow  Clarity, UA clear     Glucose, UA neg     Bilirubin, UA neg     Ketones, UA neg     Spec Grav, UA 1.010     Blood, UA neg     pH, UA 6.0     Protein, UA neg     Urobilinogen, UA negative     Nitrite, UA neg     Leukocytes, UA Negative    POCT UA - MICROSCOPIC ONLY      Result Value Range   WBC, Ur, HPF, POC neg     RBC, urine, microscopic neg     Bacteria, U Microscopic neg     Mucus, UA neg     Epithelial cells, urine per micros occ     Crystals, Ur, HPF, POC neg     Casts, Ur, LPF, POC neg     Yeast, UA neg       ASSESSMENT: Lt flank pain - Plan: POCT urinalysis dipstick, POCT UA - Microscopic Only, Ambulatory referral to Urology  Otitis, externa, infective, right  Back pain, chronic  HLD (hyperlipidemia)  HTN (hypertension)   PLAN: Orders Placed This Encounter  Procedures  . Ambulatory referral to Urology    Referral Priority:  Urgent    Referral Type:  Consultation    Referral Reason:  Specialty Services Required    Requested Specialty:  Urology    Number of Visits Requested:  1  . POCT urinalysis dipstick  . POCT UA - Microscopic Only    Meds ordered this encounter  Medications  . NEOMYCIN-POLYMYXIN-HYDROCORTISONE (CORTISPORIN) 1 % SOLN otic solution    Sig: Place 3 drops into the left ear every 4 (four) hours.    Dispense:  10 mL    Refill:  1   Return if symptoms worsen or fail to improve.  Zariah Jost P. Modesto Charon, M.D.

## 2012-11-05 NOTE — Progress Notes (Signed)
Quick Note:  Call patient. Labs normal. No change in plan. ______ 

## 2012-12-04 ENCOUNTER — Ambulatory Visit (AMBULATORY_SURGERY_CENTER): Payer: Self-pay | Admitting: *Deleted

## 2012-12-04 VITALS — Ht 60.0 in | Wt 155.0 lb

## 2012-12-04 DIAGNOSIS — Z1211 Encounter for screening for malignant neoplasm of colon: Secondary | ICD-10-CM

## 2012-12-04 MED ORDER — MOVIPREP 100 G PO SOLR: 1.0000 | Freq: Once | ORAL | Status: DC

## 2012-12-04 NOTE — Progress Notes (Signed)
Denies allergies to eggs or soy products. Denies complications with sedation or anesthesia. 

## 2012-12-05 ENCOUNTER — Encounter: Payer: Self-pay | Admitting: Gastroenterology

## 2012-12-12 ENCOUNTER — Telehealth: Payer: Self-pay | Admitting: Gastroenterology

## 2012-12-12 ENCOUNTER — Encounter: Payer: Medicare Other | Admitting: Gastroenterology

## 2012-12-12 NOTE — Telephone Encounter (Signed)
No charge. 

## 2012-12-26 ENCOUNTER — Ambulatory Visit (AMBULATORY_SURGERY_CENTER): Payer: BC Managed Care – PPO | Admitting: Gastroenterology

## 2012-12-26 ENCOUNTER — Encounter: Payer: Self-pay | Admitting: Gastroenterology

## 2012-12-26 VITALS — BP 102/58 | HR 62 | Temp 98.1°F | Resp 13 | Ht 60.0 in | Wt 155.0 lb

## 2012-12-26 DIAGNOSIS — Z1211 Encounter for screening for malignant neoplasm of colon: Secondary | ICD-10-CM

## 2012-12-26 MED ORDER — SODIUM CHLORIDE 0.9 % IV SOLN: 500.0000 mL | INTRAVENOUS | Status: DC

## 2012-12-26 NOTE — Patient Instructions (Signed)
YOU HAD AN ENDOSCOPIC PROCEDURE TODAY AT THE [facility]: Refer to the procedure report that was given to you for any specific questions about what was found during the examination.  If the procedure report does not answer your questions, please call your gastroenterologist to clarify.  If you requested that your care partner not be given the details of your procedure findings, then the procedure report has been included in a sealed envelope for you to review at your convenience later.  YOU SHOULD EXPECT: Some feelings of bloating in the abdomen. Passage of more gas than usual.  Walking can help get rid of the air that was put into your GI tract during the procedure and reduce the bloating. If you had a lower endoscopy (such as a colonoscopy or flexible sigmoidoscopy) you may notice spotting of blood in your stool or on the toilet paper. If you underwent a bowel prep for your procedure, then you may not have a normal bowel movement for a few days.  DIET: Your first meal following the procedure should be a light meal and then it is ok to progress to your normal diet.  A half-sandwich or bowl of soup is an example of a good first meal.  Heavy or fried foods are harder to digest and may make you feel nauseous or bloated.  Likewise meals heavy in dairy and vegetables can cause extra gas to form and this can also increase the bloating.  Drink plenty of fluids but you should avoid alcoholic beverages for 24 hours.  ACTIVITY: Your care partner should take you home directly after the procedure.  You should plan to take it easy, moving slowly for the rest of the day.  You can resume normal activity the day after the procedure however you should NOT DRIVE or use heavy machinery for 24 hours (because of the sedation medicines used during the test).    SYMPTOMS TO REPORT IMMEDIATELY: A gastroenterologist can be reached at any hour.  During normal business hours, 8:30 AM to 5:00 PM Monday through Friday,  call [phone].  After hours and on weekends, please call the GI answering service at [phone] who will take a message and have the physician on call contact you.   Following lower endoscopy (colonoscopy or flexible sigmoidoscopy):  Excessive amounts of blood in the stool  Significant tenderness or worsening of abdominal pains  Swelling of the abdomen that is new, acute  Fever of 100F or higher   FOLLOW UP: If any biopsies were taken you will be contacted by phone or by letter within the next 1-3 weeks.  Call your gastroenterologist if you have not heard about the biopsies in 3 weeks.  Our staff will call the home number listed on your records the next business day following your procedure to check on you and address any questions or concerns that you may have at that time regarding the information given to you following your procedure. This is a courtesy call and so if there is no answer at the home number and we have not heard from you through the emergency physician on call, we will assume that you have returned to your regular daily activities without incident.  SIGNATURES/CONFIDENTIALITY: You and/or your care partner have signed paperwork which will be entered into your electronic medical record.  These signatures attest to the fact that that the information above on your After Visit Summary has been reviewed and is understood.  Full responsibility of the confidentiality of   this discharge information lies with you and/or your care-partner.  Repeat colonoscopy in 10 years-2014 

## 2012-12-26 NOTE — Progress Notes (Signed)
Patient did not experience any of the following events: a burn prior to discharge; a fall within the facility; wrong site/side/patient/procedure/implant event; or a hospital transfer or hospital admission upon discharge from the facility. (G8907) Patient did not have preoperative order for IV antibiotic SSI prophylaxis. (G8918)  

## 2012-12-26 NOTE — Op Note (Signed)
[facility] [address] [place], [address]   COLONOSCOPY PROCEDURE REPORT  PATIENT: Amy Merritt, Amy Merritt  MR#: [id number] BIRTHDATE: 07-May-1960 , 52  yrs. old GENDER: Female ENDOSCOPIST: Meryl Dare, MD, [facility] PROCEDURE DATE:  12/26/2012 PROCEDURE:   Colonoscopy, screening First Screening Colonoscopy - Avg.  risk and is 50 yrs.  old or older - No.  Prior Negative Screening - Now for repeat screening. N/A  History of Adenoma - Now for follow-up colonoscopy & has been > or = to 3 yrs.  N/A  Polyps Removed Today? No.  Recommend repeat exam, <10 yrs? No. ASA CLASS:   Class II INDICATIONS:average risk screening. MEDICATIONS: MAC sedation, administered by CRNA and propofol (Diprivan) 200mg  IV DESCRIPTION OF PROCEDURE:   After the risks benefits and alternatives of the procedure were thoroughly explained, informed consent was obtained.  A digital rectal exam revealed no abnormalities of the rectum.   The LB [id number]  endoscope was introduced through the anus and advanced to the cecum, which was identified by both the appendix and ileocecal valve. No adverse events experienced.   The quality of the prep was adequate, using MoviPrep  The instrument was then slowly withdrawn as the colon was fully examined.  COLON FINDINGS: A normal appearing cecum, ileocecal valve, and appendiceal orifice were identified.  The ascending, hepatic flexure, transverse, splenic flexure, descending, sigmoid colon and rectum appeared unremarkable.  No polyps or cancers were seen. Retroflexed views revealed no abnormalities. The time to cecum=3 minutes 14 seconds.  Withdrawal time=9 minutes 51 seconds.  The scope was withdrawn and the procedure completed.  COMPLICATIONS: There were no complications.  ENDOSCOPIC IMPRESSION: 1. Normal colon  RECOMMENDATIONS: 1.  You should continue to follow colorectal cancer screening guidelines for "routine risk" patients with a repeat  colonoscopy in 10 years.  There is no need for routine screening FOBT (stool) testing for at least 5 years.  eSigned:  Meryl Dare, MD, [facility] 12/26/2012 8:55 AM

## 2012-12-27 ENCOUNTER — Telehealth: Payer: Self-pay | Admitting: *Deleted

## 2012-12-27 NOTE — Telephone Encounter (Signed)
  Follow up Call-  Call back number 12/26/2012 10/11/2011  Post procedure Call Back phone  # [phone]  Permission to leave phone message Yes Yes     Patient questions:  Do you have a fever, pain , or abdominal swelling? no Pain Score  0 *  Have you tolerated food without any problems? yes  Have you been able to return to your normal activities? yes  Do you have any questions about your discharge instructions: Diet   no Medications  no Follow up visit  no  Do you have questions or concerns about your Care? no  Actions: * If pain score is 4 or above: No action needed, pain <4.

## 2012-12-28 ENCOUNTER — Other Ambulatory Visit: Payer: Self-pay | Admitting: Obstetrics and Gynecology

## 2013-03-25 ENCOUNTER — Other Ambulatory Visit: Payer: Self-pay | Admitting: Gastroenterology

## 2013-07-17 ENCOUNTER — Ambulatory Visit (INDEPENDENT_AMBULATORY_CARE_PROVIDER_SITE_OTHER): Payer: BC Managed Care – PPO | Admitting: Family Medicine

## 2013-07-17 VITALS — BP 123/80 | HR 88 | Temp 98.4°F | Ht 63.0 in | Wt 150.0 lb

## 2013-07-17 DIAGNOSIS — IMO0002 Reserved for concepts with insufficient information to code with codable children: Secondary | ICD-10-CM

## 2013-07-17 DIAGNOSIS — T148XXA Other injury of unspecified body region, initial encounter: Secondary | ICD-10-CM

## 2013-07-17 NOTE — Progress Notes (Signed)
   Subjective:    Patient ID: Rosezetta Schlatter, female    DOB: 01/29/1961, 53 y.o.   MRN: [id number]  HPI This 53 y.o. female presents for evaluation of laceration left large toe on bottom.  She was walking barefoot on her camper and stepped on a sharp object and she is bleeding profusely.   Review of Systems C/o laceration left first toe No chest pain, SOB, HA, dizziness, vision change, N/V, diarrhea, constipation, dysuria, urinary urgency or frequency, myalgias, arthralgias or rash.     Objective:   Physical Exam Vital signs noted  Well developed well nourished female  2.0 cm laceration left first toe  Wound irrigated with normal saline and betadine  Laceration edges anesthetized with 1% lidocaine and then edges approximated with 3 nylon simple sutures and steri-stripped.      Assessment & Plan:  Laceration 3 nylon sutures placed and patient advised to follow up in one week for suture removal and to follow up prn if sutures come out or any signs or sx's of infection  Deatra Canter FNP

## 2013-07-26 ENCOUNTER — Ambulatory Visit (INDEPENDENT_AMBULATORY_CARE_PROVIDER_SITE_OTHER): Payer: BC Managed Care – PPO | Admitting: Family

## 2013-07-26 DIAGNOSIS — Z4802 Encounter for removal of sutures: Secondary | ICD-10-CM

## 2013-07-26 NOTE — Progress Notes (Signed)
Patient comes in today for suture removal per Ander SladeBill Oxford, FNP. She complains of some tenderness at the medial side of the laceration.   3 sutures removed without incident. Patient tolerated well. No redness, swelling, or drainage present. The wound has healed but the outer edge is not closed.   Explained that the outer edge will slough off.  No special care instructions needed at this time.  Tylenol or ibuprofen if needed for discomfort. This should improve over time.  F/U PRN.  Jannifer Rodneyhristy Hawks, FNP

## 2013-07-26 NOTE — Patient Instructions (Signed)

## 2013-08-26 ENCOUNTER — Ambulatory Visit (INDEPENDENT_AMBULATORY_CARE_PROVIDER_SITE_OTHER): Payer: BC Managed Care – PPO | Admitting: Family Medicine

## 2013-08-26 ENCOUNTER — Ambulatory Visit (INDEPENDENT_AMBULATORY_CARE_PROVIDER_SITE_OTHER): Payer: BC Managed Care – PPO

## 2013-08-26 ENCOUNTER — Encounter: Payer: Self-pay | Admitting: Family Medicine

## 2013-08-26 VITALS — BP 132/86 | HR 107 | Temp 100.4°F | Ht 63.0 in | Wt 151.4 lb

## 2013-08-26 DIAGNOSIS — J208 Acute bronchitis due to other specified organisms: Secondary | ICD-10-CM

## 2013-08-26 DIAGNOSIS — M546 Pain in thoracic spine: Secondary | ICD-10-CM

## 2013-08-26 DIAGNOSIS — R0602 Shortness of breath: Secondary | ICD-10-CM

## 2013-08-26 DIAGNOSIS — J209 Acute bronchitis, unspecified: Secondary | ICD-10-CM

## 2013-08-26 LAB — POCT URINALYSIS DIPSTICK
Bilirubin, UA: NEGATIVE
Glucose, UA: NEGATIVE
Ketones, UA: NEGATIVE
Leukocytes, UA: NEGATIVE
Nitrite, UA: NEGATIVE
Protein, UA: NEGATIVE
Spec Grav, UA: <=1.005
Urobilinogen, UA: NEGATIVE
pH, UA: 6.5

## 2013-08-26 LAB — POCT CBC
Granulocyte percent: 84.1 %G — AB (ref 37–80)
HCT, POC: 37.4 % — AB (ref 37.7–47.9)
Hemoglobin: 12.4 g/dL (ref 12.2–16.2)
Lymph, poc: 1 (ref 0.6–3.4)
MCH, POC: 31.8 pg — AB (ref 27–31.2)
MCHC: 33.2 g/dL (ref 31.8–35.4)
MCV: 95.8 fL (ref 80–97)
MPV: 7.2 fL (ref 0–99.8)
POC Granulocyte: 7.6 — AB (ref 2–6.9)
POC LYMPH PERCENT: 11.6 %L (ref 10–50)
Platelet Count, POC: 293 10*3/uL (ref 142–424)
RBC: 3.9 M/uL — AB (ref 4.04–5.48)
RDW, POC: 13 %
WBC: 9 10*3/uL (ref 4.6–10.2)

## 2013-08-26 LAB — POCT UA - MICROSCOPIC ONLY
Bacteria, U Microscopic: NEGATIVE
Casts, Ur, LPF, POC: NEGATIVE
Crystals, Ur, HPF, POC: NEGATIVE
Mucus, UA: NEGATIVE
WBC, Ur, HPF, POC: NEGATIVE
Yeast, UA: NEGATIVE

## 2013-08-26 MED ORDER — BENZONATATE 100 MG PO CAPS: 100.0000 mg | ORAL_CAPSULE | Freq: Three times a day (TID) | ORAL | Status: DC | PRN

## 2013-08-26 MED ORDER — METHYLPREDNISOLONE ACETATE 80 MG/ML IJ SUSP
80.0000 mg | Freq: Once | INTRAMUSCULAR | Status: AC
Start: 2013-08-26 — End: 2013-08-26
  Administered 2013-08-26: 80 mg via INTRAMUSCULAR

## 2013-08-26 MED ORDER — LEVALBUTEROL HCL 1.25 MG/0.5ML IN NEBU
1.2500 mg | INHALATION_SOLUTION | Freq: Once | RESPIRATORY_TRACT | Status: AC
Start: 2013-08-26 — End: 2013-08-26
  Administered 2013-08-26: 1.25 mg via RESPIRATORY_TRACT

## 2013-08-26 MED ORDER — METHYLPREDNISOLONE (PAK) 4 MG PO TABS: ORAL_TABLET | ORAL | Status: DC

## 2013-08-26 MED ORDER — LEVOFLOXACIN 500 MG PO TABS: 500.0000 mg | ORAL_TABLET | Freq: Every day | ORAL | Status: DC

## 2013-08-26 NOTE — Addendum Note (Signed)
Addended by: Tamera PuntWRAY, WENDY S on: 08/26/2013 05:33 PM   Modules accepted: Orders

## 2013-08-26 NOTE — Progress Notes (Signed)
   Subjective:    Patient ID: Amy Merritt, female    DOB: September 18, 1960, 53 y.o.   MRN: [id number]  HPI This 53 y.o. female presents for evaluation of back pain.  She is concerned she might have a kidney stone.  She has been having a lot of coughing, SOB, fever, mucopurulent respiratory secretions and facial discomfort.  She has dental problems and has been taking amoxicillin for the last month she states.   Review of Systems C/o cough and sob and fever   No chest pain HA, dizziness, vision change, N/V, diarrhea, constipation, dysuria, urinary urgency or frequency, myalgias, arthralgias or rash.  Objective:   Physical Exam Vital signs noted  Well developed well nourished female.  HEENT - Head atraumatic Normocephalic                Eyes - PERRLA, Conjuctiva - clear Sclera- Clear EOMI                Ears - EAC's Wnl TM's Wnl Gross Hearing WNL                Nose - Nares patent                 Throat - oropharanx wnl Respiratory - Lungs with expiratory wheezes bilateral Cardiac - RRR S1 and S2 without murmur GI - Abdomen soft Nontender and bowel sounds active x 4 Extremities - No edema. Neuro - Grossly intact.  CXR - No infiltrates Prelimnary reading by Angeline SlimWilliam Oxford,FNP     Assessment & Plan:  SOB (shortness of breath) - Plan: DG Chest 2 View, POCT urinalysis dipstick, POCT UA - Microscopic Only, POCT CBC, methylPREDNISolone acetate (DEPO-MEDROL) injection 80 mg, levalbuterol (XOPENEX) nebulizer solution 1.25 mg, levofloxacin (LEVAQUIN) 500 MG tablet, methylPREDNIsolone (MEDROL DOSPACK) 4 MG tablet, benzonatate (TESSALON PERLES) 100 MG capsule  Push po fluids, rest, tylenol and motrin otc prn as directed for fever, arthralgias, and myalgias.  Follow up prn if sx's continue or persist.  Left-sided thoracic back pain - Plan: DG Chest 2 View, POCT urinalysis dipstick, POCT UA - Microscopic Only, POCT CBC, methylPREDNISolone acetate (DEPO-MEDROL) injection 80 mg, levalbuterol  (XOPENEX) nebulizer solution 1.25 mg, levofloxacin (LEVAQUIN) 500 MG tablet, methylPREDNIsolone (MEDROL DOSPACK) 4 MG tablet, benzonatate (TESSALON PERLES) 100 MG capsule  Acute bronchitis due to other specified organisms - Plan: methylPREDNISolone acetate (DEPO-MEDROL) injection 80 mg, levalbuterol (XOPENEX) nebulizer solution 1.25 mg, levofloxacin (LEVAQUIN) 500 MG tablet, methylPREDNIsolone (MEDROL DOSPACK) 4 MG tablet, benzonatate (TESSALON PERLES) 100 MG capsule  Deatra CanterWilliam J Oxford FNP

## 2013-11-28 ENCOUNTER — Other Ambulatory Visit: Payer: Self-pay

## 2013-11-28 MED ORDER — PANTOPRAZOLE SODIUM 40 MG PO TBEC: 40.0000 mg | DELAYED_RELEASE_TABLET | Freq: Every day | ORAL | Status: DC

## 2013-12-12 ENCOUNTER — Encounter: Payer: Self-pay | Admitting: Gastroenterology

## 2013-12-12 ENCOUNTER — Ambulatory Visit (INDEPENDENT_AMBULATORY_CARE_PROVIDER_SITE_OTHER): Payer: BC Managed Care – PPO | Admitting: Gastroenterology

## 2013-12-12 VITALS — BP 104/60 | HR 72 | Ht 60.25 in | Wt 152.4 lb

## 2013-12-12 DIAGNOSIS — K315 Obstruction of duodenum: Secondary | ICD-10-CM

## 2013-12-12 DIAGNOSIS — K219 Gastro-esophageal reflux disease without esophagitis: Secondary | ICD-10-CM

## 2013-12-12 MED ORDER — PANTOPRAZOLE SODIUM 40 MG PO TBEC: 40.0000 mg | DELAYED_RELEASE_TABLET | Freq: Two times a day (BID) | ORAL | Status: DC

## 2013-12-12 NOTE — Progress Notes (Signed)
    History of Present Illness: This is a 53 year old female returning for follow-up of GERD. She states she has almost daily breakthrough reflux symptoms generally occurring in the evenings. She takes her pantoprazole every morning before breakfast. She uses Tums as needed. She has no other gastrointestinal complaints.  Current Medications, Allergies, Past Medical History, Past Surgical History, Family History and Social History were reviewed in [facility] electronic medical record.  Physical Exam: General: Well developed , well nourished, no acute distress Head: Normocephalic and atraumatic Eyes:  sclerae anicteric, EOMI Ears: Normal auditory acuity Mouth: No deformity or lesions Lungs: Clear throughout to auscultation Heart: Regular rate and rhythm; no murmurs, rubs or bruits Abdomen: Soft, non tender and non distended. No masses, hepatosplenomegaly or hernias noted. Normal Bowel sounds Musculoskeletal: Symmetrical with no gross deformities  Pulses:  Normal pulses noted Extremities: No clubbing, cyanosis, edema or deformities noted Neurological: Alert oriented x 4, grossly nonfocal Psychological:  Alert and cooperative. Normal mood and affect  Assessment and Recommendations:  1. GERD. Increase pantoprazole to 40 mg bid and follow all standard antireflux measures.   2. Duodenal stricture. Monitor for signs of outlet obstruction.  3. History of lymphocytic colitis. Currently inactive.

## 2013-12-12 NOTE — Patient Instructions (Addendum)
We have sent medications to your pharmacy for you to pick up at your convenience. Increase your pantoprazole to 40 mg twice daily 20-30 minutes prior to breakfast and dinner. CC:  Rudi Heaponald Moore MD     Pantoprazole was sent to [facility] in error, the pharmacy Misty Stanley(Lisa) was notified to cancel and was resent to [facility] for 90 day supply.

## 2013-12-27 ENCOUNTER — Other Ambulatory Visit: Payer: Self-pay | Admitting: Gastroenterology

## 2013-12-27 NOTE — Telephone Encounter (Signed)
Patient requests refills on pantoprazole be sent to [facility]. Rx for #180 with 3 refills was sent on 12/12/13. I have contacted Tia at [facility]. She states that patient got rx for #180 on 12/12/13. Therefore, no additional refill will be given at this time.

## 2014-01-01 ENCOUNTER — Other Ambulatory Visit: Payer: Self-pay | Admitting: Obstetrics and Gynecology

## 2014-01-06 LAB — CYTOLOGY - PAP

## 2014-01-29 ENCOUNTER — Other Ambulatory Visit: Payer: Self-pay | Admitting: Gastroenterology

## 2014-02-18 ENCOUNTER — Encounter: Payer: BC Managed Care – PPO | Admitting: Family Medicine

## 2014-02-25 ENCOUNTER — Ambulatory Visit (INDEPENDENT_AMBULATORY_CARE_PROVIDER_SITE_OTHER): Payer: BLUE CROSS/BLUE SHIELD | Admitting: Family Medicine

## 2014-02-25 ENCOUNTER — Encounter: Payer: Self-pay | Admitting: Family Medicine

## 2014-02-25 VITALS — BP 124/80 | HR 69 | Temp 96.7°F | Ht 61.0 in | Wt 156.0 lb

## 2014-02-25 DIAGNOSIS — F988 Other specified behavioral and emotional disorders with onset usually occurring in childhood and adolescence: Secondary | ICD-10-CM

## 2014-02-25 DIAGNOSIS — E785 Hyperlipidemia, unspecified: Secondary | ICD-10-CM

## 2014-02-25 DIAGNOSIS — I10 Essential (primary) hypertension: Secondary | ICD-10-CM

## 2014-02-25 DIAGNOSIS — Z23 Encounter for immunization: Secondary | ICD-10-CM

## 2014-02-25 DIAGNOSIS — F909 Attention-deficit hyperactivity disorder, unspecified type: Secondary | ICD-10-CM

## 2014-02-25 DIAGNOSIS — Z0000 Encounter for general adult medical examination without abnormal findings: Secondary | ICD-10-CM

## 2014-02-25 DIAGNOSIS — Z0001 Encounter for general adult medical examination with abnormal findings: Secondary | ICD-10-CM

## 2014-02-25 DIAGNOSIS — M4712 Other spondylosis with myelopathy, cervical region: Secondary | ICD-10-CM

## 2014-02-25 DIAGNOSIS — R6889 Other general symptoms and signs: Secondary | ICD-10-CM

## 2014-02-25 DIAGNOSIS — R072 Precordial pain: Secondary | ICD-10-CM

## 2014-02-25 MED ORDER — LISINOPRIL 10 MG PO TABS: 10.0000 mg | ORAL_TABLET | Freq: Every day | ORAL | Status: DC

## 2014-02-25 MED ORDER — ASPIRIN 81 MG PO TABS: 81.0000 mg | ORAL_TABLET | Freq: Every day | ORAL | Status: DC

## 2014-02-25 NOTE — Progress Notes (Signed)
Subjective:    Patient ID: Amy Merritt, female    DOB: [birth year], 54 y.o.   MRN: [id number]  HPI Patient is here today for a wellness exam.  She has her pap and mammogram at [place] which is up to date. Patient relates a history of multiple back surgeries that have led to her disability. She is able to do moderate activities on some days but other days has to stay in bed all day. She has been sent in for lab work by her rheumatologist because of ongoing widespread arthralgia. Currently she states she has some left-sided chest. Periodically. However it is not episodic with exertion. It does not radiate. It is not associated with shortness of breath or diaphoresis or nausea. Current Outpatient Prescriptions on File Prior to Visit  Medication Sig Dispense Refill  . ALPRAZolam (XANAX) 1 MG tablet Take 1 mg by mouth 4 (four) times daily as needed. ANXIETY     . amphetamine-dextroamphetamine (ADDERALL XR) 10 MG 24 hr capsule     . docusate sodium (COLACE) 100 MG capsule Take 300 mg by mouth daily.    Marland Kitchen gabapentin (NEURONTIN) 400 MG capsule Take 400 mg by mouth 2 (two) times daily.     . Glucosamine-Chondroitin (GLUCOSAMINE CHONDR COMPLEX PO) Take 1 tablet by mouth 2 (two) times daily.     . meloxicam (MOBIC) 15 MG tablet Take 15 mg by mouth daily.    . methocarbamol (ROBAXIN) 750 MG tablet Take 750 mg by mouth every 6 (six) hours as needed for muscle spasms.    . Multiple Vitamin (MULTIVITAMIN) tablet Take 1 tablet by mouth daily.    . naproxen (NAPROSYN) 500 MG tablet Take 500 mg by mouth 2 (two) times daily with a meal.    . oxycodone (ROXICODONE) 30 MG immediate release tablet Take 30 mg by mouth every 4 (four) hours as needed (Dr Nicholaus Bloom). PAIN    . pantoprazole (PROTONIX) 40 MG tablet Take 1 tablet (40 mg total) by mouth 2 (two) times daily before a meal. 180 tablet 3  . venlafaxine (EFFEXOR) 75 MG tablet Take 2 caplets by mouth in the morning and 1 in the evening     No  current facility-administered medications on file prior to visit.    Review of Systems  Constitutional: Negative for fever, chills, diaphoresis, appetite change, fatigue and unexpected weight change.  HENT: Negative for congestion, ear pain, hearing loss, postnasal drip, rhinorrhea, sneezing, sore throat and trouble swallowing.   Eyes: Negative for photophobia, pain, redness and visual disturbance.  Respiratory: Negative for apnea, cough, choking, chest tightness, shortness of breath and wheezing.   Cardiovascular: Positive for chest pain. Negative for palpitations and leg swelling.  Gastrointestinal: Negative for nausea, vomiting, abdominal pain, diarrhea and constipation.  Endocrine: Negative for cold intolerance, heat intolerance, polydipsia, polyphagia and polyuria.  Genitourinary: Negative for dysuria, frequency, hematuria, flank pain, vaginal bleeding, vaginal discharge, enuresis, difficulty urinating, menstrual problem and pelvic pain.  Musculoskeletal: Positive for back pain and arthralgias.       Severe chronic neck pain radiating into the shoulders. Severe LBP. No current radiation. S/P 6 spinal surgeries for arthritis.  Skin: Negative for rash.  Neurological: Negative for dizziness, tremors, seizures, syncope, speech difficulty, weakness, light-headedness, numbness and headaches.  Psychiatric/Behavioral: Negative for behavioral problems, confusion, dysphoric mood, decreased concentration and agitation. The patient is not nervous/anxious.        Objective:   Physical Exam  Constitutional: She is oriented to person, place,  and time. She appears well-developed and well-nourished. No distress.  HENT:  Head: Normocephalic and atraumatic.  Right Ear: External ear normal.  Left Ear: External ear normal.  Nose: Nose normal.  Mouth/Throat: Oropharynx is clear and moist.  Eyes: Conjunctivae and EOM are normal. Pupils are equal, round, and reactive to light.  Neck: Normal range of  motion. Neck supple. No JVD present. No thyromegaly present.  Cardiovascular: Normal rate, regular rhythm and normal heart sounds.  Exam reveals no gallop and no friction rub.   No murmur heard. Pulmonary/Chest: Effort normal and breath sounds normal. No respiratory distress. She has no wheezes. She has no rales. She exhibits no tenderness.  Abdominal: Soft. Bowel sounds are normal. She exhibits no distension and no mass. There is no tenderness.  Genitourinary:  Deferred to Gyn  Musculoskeletal: Normal range of motion. She exhibits no edema or tenderness.  Lymphadenopathy:    She has no cervical adenopathy.  Neurological: She is alert and oriented to person, place, and time. She has normal reflexes. No cranial nerve deficit. She exhibits normal muscle tone. Coordination normal.  Skin: Skin is warm and dry. No rash noted.  Psychiatric: She has a normal mood and affect. Her behavior is normal. Judgment and thought content normal.  Vitals reviewed.  BP 124/80 mmHg  Pulse 69  Temp(Src) 96.7 F (35.9 C) (Oral)  Ht $R'5\' 1"'aC$  (1.549 m)  Wt 156 lb (70.761 kg)  BMI 29.49 kg/m2        Assessment & Plan:  1. Essential hypertension - CBC With differential/Platelet - Sedimentation rate - C-reactive protein - Rheumatoid factor - ANA - TSH  2. Precordial pain - Echocardiogram stress test with contrast; Future - Sedimentation rate - C-reactive protein - Rheumatoid factor - ANA - TSH  3. HLD (hyperlipidemia) - NMR, lipoprofile - TSH  4. ADD (attention deficit disorder) Managed by pain management due to use of multiple CII substances  5. Health maintenance examination Essentially normal with exception of chest and back pain - CBC With differential/Platelet - CMP14+EGFR - Vit D  25 hydroxy (rtn osteoporosis monitoring)  6. Spondylosis, cervical, with myelopathy Chronic, under care of pain clinic - CBC With differential/Platelet - Sedimentation rate - C-reactive protein -  Rheumatoid factor - ANA

## 2014-02-27 LAB — CMP14+EGFR
ALBUMIN: 4.1 g/dL (ref 3.5–5.5)
ALK PHOS: 75 IU/L (ref 39–117)
ALT: 14 IU/L (ref 0–32)
AST: 20 IU/L (ref 0–40)
Albumin/Globulin Ratio: 1.9 (ref 1.1–2.5)
BUN / CREAT RATIO: 16 (ref 9–23)
BUN: 13 mg/dL (ref 6–24)
CALCIUM: 9 mg/dL (ref 8.7–10.2)
CO2: 25 mmol/L (ref 18–29)
CREATININE: 0.8 mg/dL (ref 0.57–1.00)
Chloride: 99 mmol/L (ref 97–108)
GFR calc Af Amer: 99 mL/min/{1.73_m2} (ref >59)
GFR calc non Af Amer: 86 mL/min/{1.73_m2} (ref >59)
GLOBULIN, TOTAL: 2.2 g/dL (ref 1.5–4.5)
Glucose: 92 mg/dL (ref 65–99)
POTASSIUM: 4.5 mmol/L (ref 3.5–5.2)
Sodium: 137 mmol/L (ref 134–144)
Total Bilirubin: <0.2 mg/dL (ref 0.0–1.2)
Total Protein: 6.3 g/dL (ref 6.0–8.5)

## 2014-02-27 LAB — CBC WITH DIFFERENTIAL
Basophils Absolute: 0 10*3/uL (ref 0.0–0.2)
Basos: 0 %
EOS: 3 %
Eosinophils Absolute: 0.1 10*3/uL (ref 0.0–0.4)
HCT: 38 % (ref 34.0–46.6)
Hemoglobin: 12.7 g/dL (ref 11.1–15.9)
IMMATURE GRANS (ABS): 0 10*3/uL (ref 0.0–0.1)
Immature Granulocytes: 0 %
LYMPHS ABS: 1.3 10*3/uL (ref 0.7–3.1)
Lymphs: 24 %
MCH: 31.6 pg (ref 26.6–33.0)
MCHC: 33.4 g/dL (ref 31.5–35.7)
MCV: 95 fL (ref 79–97)
Monocytes Absolute: 0.4 10*3/uL (ref 0.1–0.9)
Monocytes: 8 %
NEUTROS ABS: 3.5 10*3/uL (ref 1.4–7.0)
Neutrophils Relative %: 65 %
Platelets: 328 10*3/uL (ref 150–379)
RBC: 4.02 x10E6/uL (ref 3.77–5.28)
RDW: 12.9 % (ref 12.3–15.4)
WBC: 5.3 10*3/uL (ref 3.4–10.8)

## 2014-02-27 LAB — TSH: TSH: 1.44 u[IU]/mL (ref 0.450–4.500)

## 2014-02-27 LAB — VITAMIN D 25 HYDROXY (VIT D DEFICIENCY, FRACTURES): Vit D, 25-Hydroxy: 30.4 ng/mL (ref 30.0–100.0)

## 2014-02-27 LAB — NMR, LIPOPROFILE
Cholesterol: 235 mg/dL — ABNORMAL HIGH (ref 100–199)
HDL Cholesterol by NMR: 59 mg/dL (ref >39)
HDL PARTICLE NUMBER: 36.8 umol/L (ref >=30.5)
LDL PARTICLE NUMBER: 1556 nmol/L — AB (ref <1000)
LDL SIZE: 21.8 nm (ref >20.5)
LDL-C: 158 mg/dL — ABNORMAL HIGH (ref 0–99)
LP-IR Score: 26 (ref <=45)
Small LDL Particle Number: 342 nmol/L (ref <=527)
Triglycerides by NMR: 89 mg/dL (ref 0–149)

## 2014-02-27 LAB — ANA: Anti Nuclear Antibody(ANA): POSITIVE — AB

## 2014-02-27 LAB — RHEUMATOID FACTOR: Rhuematoid fact SerPl-aCnc: 7.5 IU/mL (ref 0.0–13.9)

## 2014-02-27 LAB — C-REACTIVE PROTEIN: CRP: 2.6 mg/L (ref 0.0–4.9)

## 2014-02-27 LAB — SEDIMENTATION RATE: Sed Rate: 3 mm/hr (ref 0–40)

## 2014-02-28 LAB — SPECIMEN STATUS REPORT

## 2014-02-28 LAB — HIV ANTIBODY (ROUTINE TESTING W REFLEX)
HIV 1/O/2 Abs-Index Value: <1 (ref <1.00)
HIV-1/HIV-2 Ab: NONREACTIVE

## 2014-02-28 LAB — HEPATITIS C ANTIBODY

## 2014-03-07 ENCOUNTER — Ambulatory Visit (HOSPITAL_COMMUNITY): Admission: RE | Admit: 2014-03-07 | Payer: BLUE CROSS/BLUE SHIELD | Source: Ambulatory Visit

## 2014-03-12 ENCOUNTER — Telehealth: Payer: Self-pay | Admitting: Family Medicine

## 2014-03-13 NOTE — Telephone Encounter (Signed)
Spoke with pt and Amy HawkingAnnie Penn appt scheduled

## 2014-03-17 ENCOUNTER — Ambulatory Visit (INDEPENDENT_AMBULATORY_CARE_PROVIDER_SITE_OTHER): Payer: BLUE CROSS/BLUE SHIELD | Admitting: Family Medicine

## 2014-03-17 ENCOUNTER — Encounter: Payer: Self-pay | Admitting: Family Medicine

## 2014-03-17 VITALS — BP 123/79 | HR 78 | Temp 96.9°F | Ht 64.0 in | Wt 150.4 lb

## 2014-03-17 DIAGNOSIS — J069 Acute upper respiratory infection, unspecified: Secondary | ICD-10-CM

## 2014-03-17 MED ORDER — AMOXICILLIN 875 MG PO TABS: 875.0000 mg | ORAL_TABLET | Freq: Two times a day (BID) | ORAL | Status: DC

## 2014-03-17 MED ORDER — METHYLPREDNISOLONE ACETATE 80 MG/ML IJ SUSP
80.0000 mg | Freq: Once | INTRAMUSCULAR | Status: AC
  Administered 2014-03-17: 80 mg via INTRAMUSCULAR

## 2014-03-17 NOTE — Progress Notes (Signed)
   Subjective:    Patient ID: Amy Merritt, female    DOB: [birth year], 54 y.o.   MRN: [id number]  HPI Patient is c/o uri sx's  Review of Systems  Constitutional: Negative for fever.  HENT: Negative for ear pain.   Eyes: Negative for discharge.  Respiratory: Negative for cough.   Cardiovascular: Negative for chest pain.  Gastrointestinal: Negative for abdominal distention.  Endocrine: Negative for polyuria.  Genitourinary: Negative for difficulty urinating.  Musculoskeletal: Negative for gait problem and neck pain.  Skin: Negative for color change and rash.  Neurological: Negative for speech difficulty and headaches.  Psychiatric/Behavioral: Negative for agitation.       Objective:    BP 123/79 mmHg  Pulse 78  Temp(Src) 96.9 F (36.1 C) (Oral)  Ht 5\' 4"  (1.626 m)  Wt 150 lb 6.4 oz (68.221 kg)  BMI 25.80 kg/m2 Physical Exam  Constitutional: She is oriented to person, place, and time. She appears well-developed and well-nourished.  HENT:  Head: Normocephalic and atraumatic.  Mouth/Throat: Oropharynx is clear and moist.  Eyes: Pupils are equal, round, and reactive to light.  Neck: Normal range of motion. Neck supple.  Cardiovascular: Normal rate and regular rhythm.   No murmur heard. Pulmonary/Chest: Effort normal and breath sounds normal.  Abdominal: Soft. Bowel sounds are normal. There is no tenderness.  Neurological: She is alert and oriented to person, place, and time.  Skin: Skin is warm and dry.  Psychiatric: She has a normal mood and affect.          Assessment & Plan:     ICD-9-CM ICD-10-CM   1. URI (upper respiratory infection) 465.9 J06.9 methylPREDNISolone acetate (DEPO-MEDROL) injection 80 mg     amoxicillin (AMOXIL) 875 MG tablet     No Follow-up on file.  Deatra CanterWilliam J Aricka Goldberger FNP

## 2014-03-19 ENCOUNTER — Other Ambulatory Visit (HOSPITAL_COMMUNITY): Payer: Medicare Other

## 2014-03-20 ENCOUNTER — Ambulatory Visit (HOSPITAL_COMMUNITY): Admission: RE | Admit: 2014-03-20 | Payer: Medicare Other | Source: Ambulatory Visit

## 2014-03-25 ENCOUNTER — Telehealth: Payer: Self-pay | Admitting: Family Medicine

## 2014-03-25 MED ORDER — AMOXICILLIN-POT CLAVULANATE 875-125 MG PO TABS: 1.0000 | ORAL_TABLET | Freq: Two times a day (BID) | ORAL | Status: DC

## 2014-03-25 NOTE — Telephone Encounter (Signed)
Sent in electronically.  Patient aware.

## 2014-03-25 NOTE — Telephone Encounter (Signed)
Nasal congestion, runny nose, post nasal drainage, and headache. Felt that she was improving some after the depo medrol injection but symptoms haven't resolved and she would like another antibiotic.  She was evaluated by Ander SladeBill Oxford, FNP on 03/17/14 and given Amoxicillin.

## 2014-03-25 NOTE — Telephone Encounter (Signed)
Send in augmentin 875 1 BID X 10 days. Thanks WS

## 2014-03-28 ENCOUNTER — Encounter (HOSPITAL_COMMUNITY): Payer: Self-pay

## 2014-03-28 ENCOUNTER — Ambulatory Visit (HOSPITAL_COMMUNITY)
Admission: RE | Admit: 2014-03-28 | Discharge: 2014-03-28 | Disposition: A | Discharge status: Home / Self Care | Payer: BLUE CROSS/BLUE SHIELD | Source: Ambulatory Visit | Attending: Family Medicine | Admitting: Family Medicine

## 2014-03-28 DIAGNOSIS — R072 Precordial pain: Secondary | ICD-10-CM

## 2014-03-28 DIAGNOSIS — R079 Chest pain, unspecified: Secondary | ICD-10-CM

## 2014-03-28 NOTE — Progress Notes (Signed)
*  PRELIMINARY RESULTS* Echocardiogram Stress has been performed.  Jeryl ColumbiaLLIOTT, Asaad Gulley 03/28/2014, 11:02 AM

## 2014-03-28 NOTE — Progress Notes (Signed)
Stress Lab Nurses Notes - Amy Merritt  Amy SchlatterRenee V Merritt 03/28/2014 Reason for doing test: Chest Pain Type of test: Stress Echo Nurse performing test: Amy PoissonPhyllis Billingsly, RN Nuclear Medicine Tech: Not Applicable Echo Tech: Amy ColumbiaJoHanna Elliott MD performing test: Amy Merritt/Amy BishopLawrence NP Family MD: Amy Merritt Test explained and consent signed: Yes.   IV started: No IV started Symptoms: Fatigue Treatment/Intervention: None Reason test stopped: fatigue After recovery IV was: NA Patient to return to Nuc. Med at : NA Patient discharged: Home Patient's Condition upon discharge was: stable Comments: During test peak BP 135/67& HR 150.  Recovery BP 96/52 & HR 82 .  Symptoms resolved in recovery. Amy Merritt, Amy Merritt

## 2014-05-14 ENCOUNTER — Ambulatory Visit: Payer: BLUE CROSS/BLUE SHIELD

## 2014-05-14 ENCOUNTER — Other Ambulatory Visit: Payer: BLUE CROSS/BLUE SHIELD

## 2014-05-28 ENCOUNTER — Ambulatory Visit (INDEPENDENT_AMBULATORY_CARE_PROVIDER_SITE_OTHER): Payer: BLUE CROSS/BLUE SHIELD

## 2014-05-28 ENCOUNTER — Encounter: Payer: Self-pay | Admitting: Pharmacist

## 2014-05-28 ENCOUNTER — Ambulatory Visit (INDEPENDENT_AMBULATORY_CARE_PROVIDER_SITE_OTHER): Payer: BLUE CROSS/BLUE SHIELD | Admitting: Pharmacist

## 2014-05-28 VITALS — BP 122/68 | HR 78 | Ht 61.0 in | Wt 150.0 lb

## 2014-05-28 DIAGNOSIS — Z0000 Encounter for general adult medical examination without abnormal findings: Secondary | ICD-10-CM

## 2014-05-28 DIAGNOSIS — Z7189 Other specified counseling: Secondary | ICD-10-CM

## 2014-05-28 DIAGNOSIS — Z780 Asymptomatic menopausal state: Secondary | ICD-10-CM | POA: Diagnosis not present

## 2014-05-28 DIAGNOSIS — Z13820 Encounter for screening for osteoporosis: Secondary | ICD-10-CM | POA: Diagnosis not present

## 2014-05-28 MED ORDER — MUPIROCIN 2 % EX OINT: 1.0000 "application " | TOPICAL_OINTMENT | Freq: Two times a day (BID) | CUTANEOUS | Status: DC

## 2014-05-28 NOTE — Progress Notes (Signed)
Patient ID: Amy Merritt, female   DOB: 13-Oct-1960, 54 y.o.   MRN: [id number]  [facility] Current Height: Height:  (154.9 cm)      Max Lifetime Height:  5' 1.5" Current Weight: Weight: 150 lb (68.04 kg)       Ethnicity:Caucasian  BP: BP: 122/68 mmHg     HR:  Pulse Rate: 78      HPI: Does pt already have a diagnosis of:  Osteopenia?  No Osteoporosis?  No  Back Pain?  Yes       Kyphosis?  No Prior fracture?  No Med(s) for Osteoporosis/Osteopenia:  none Med(s) previously tried for Osteoporosis/Osteopenia:  none                                                               FH/SH: Family history of osteoporosis?  Yes - mother Parent with history of hip fracture?  No Family history of breast cancer?  No Exercise?  Yes - walking daily Smoking?  No Alcohol?  No    Current Medications (verified) Outpatient Encounter Prescriptions as of 05/28/2014  Medication Sig  . ALPRAZolam (XANAX) 1 MG tablet Take 1 mg by mouth 4 (four) times daily as needed. ANXIETY   . amoxicillin (AMOXIL) 875 MG tablet Take 1 tablet (875 mg total) by mouth 2 (two) times daily.  Marland Kitchen amoxicillin-clavulanate (AUGMENTIN) 875-125 MG per tablet Take 1 tablet by mouth 2 (two) times daily.  Marland Kitchen amphetamine-dextroamphetamine (ADDERALL XR) 15 MG 24 hr capsule Take 1 capsule by mouth daily.  Marland Kitchen aspirin 81 MG tablet Take 1 tablet (81 mg total) by mouth daily.  Marland Kitchen docusate sodium (COLACE) 100 MG capsule Take 300 mg by mouth daily.  Marland Kitchen gabapentin (NEURONTIN) 400 MG capsule Take 400 mg by mouth 2 (two) times daily.   . Glucosamine-Chondroitin (GLUCOSAMINE CHONDR COMPLEX PO) Take 1 tablet by mouth 2 (two) times daily.   Marland Kitchen lisinopril (PRINIVIL,ZESTRIL) 10 MG tablet Take 1 tablet (10 mg total) by mouth daily.  . meloxicam (MOBIC) 15 MG tablet Take 15 mg by mouth daily.  . methocarbamol (ROBAXIN) 750 MG tablet Take 750 mg by mouth every 6 (six) hours as needed for muscle spasms.  . Multiple Vitamin (MULTIVITAMIN) tablet  Take 1 tablet by mouth daily.  . mupirocin ointment (BACTROBAN) 2 % Place 1 application into the nose 2 (two) times daily.  . naproxen (NAPROSYN) 500 MG tablet Take 500 mg by mouth 2 (two) times daily with a meal.  . oxycodone (ROXICODONE) 30 MG immediate release tablet Take 30 mg by mouth every 4 (four) hours as needed (Dr Thyra Breed). PAIN  . pantoprazole (PROTONIX) 40 MG tablet Take 1 tablet (40 mg total) by mouth 2 (two) times daily before a meal.  . venlafaxine (EFFEXOR) 75 MG tablet Take 2 caplets by mouth in the morning and 1 in the evening    Allergies (verified) Morphine and related   History: Past Medical History  Diagnosis Date  . Headache(784.0)   . Neuromuscular disorder   . Arthritis   . PONV (postoperative nausea and vomiting)   . Lymphocytic colitis   . Depression   . IBS (irritable bowel syndrome)   . Chronic kidney disease   . Kidney stone   . DJD (degenerative joint disease)   .  DJD (degenerative joint disease) of cervical spine   . DJD (degenerative joint disease), lumbar   . Hypertension    Past Surgical History  Procedure Laterality Date  . Lumbar disc surgery      x 4- lower back with rods and screws  . Tubal ligation    . Lithotrpsy    . Cervical fusion      x 2  . Colonoscopy  02/11/2003  . Spine surgery     Family History  Problem Relation Age of Onset  . Colitis Father   . Heart defect Father   . Hypertension Father   . Diabetes Father   . Cancer Father     ?  Marland Kitchen. Heart disease Mother   . Hypertension Mother   . Diabetes      multiple aunts  . Hypertension Brother   . Colon cancer Neg Hx   . Rectal cancer Neg Hx   . Stomach cancer Neg Hx   . Esophageal cancer Neg Hx    Social History   Occupational History  . Not on file.   Social History Main Topics  . Smoking status: Former Smoker    Quit date: 08/27/2010  . Smokeless tobacco: Never Used  . Alcohol Use: No  . Drug Use: No  . Sexual Activity: Yes    Birth Control/  Protection: Surgical     Calcium Assessment Calcium Intake  # of servings/day  Calcium mg  Milk (8 oz) 0  x  300  = 0  Yogurt (4 oz) 1 x  200 = 200mg   Cheese (1 oz) 1 x  200 = 200mg   Other Calcium sources   250mg   Ca supplement 600mg  = 600mg    Estimated calcium intake per day 1250mg     DEXA Results Date of Test T-Score for AP Spine L1-L4 T-Score for Total Left Hip T-Score for Total Right Hip  05/28/2014 0.7 0.4 0.3                  Assessment: Normal bone density Medication review - chronic PPI thearpy  Recommendations: 1.  Discussed BMD  / DEXA results and discussed fracture risk. 2.  continue calcium 1200mg  daily through supplementation or diet.  3.  continue weight bearing exercise - 30 minutes at least 4 days per week.   4.  Counseled and educated about fall risk and prevention. 5.  Reviewed and update patient's medication records 6.  Recommended trial of pantoprazole 40mg  1 tablet daily instead of bid.  If no worsening of reflux after 1 month then trial of every other day.  Recheck DEXA:  3 years to 5 years.  Time spent counseling patient:  20 minutes  Henrene Pastorammy Bailea Beed, PharmD, CPP

## 2014-05-28 NOTE — Patient Instructions (Signed)
Continue calcium supplement and multivitamin - make sure you take them at separate times of day though.                Exercise for Strong Bones  Exercise is important to build and maintain strong bones / bone density.  There are 2 types of exercises that are important to building and maintaining strong bones:  Weight- bearing and muscle-stregthening.  Weight-bearing Exercises  These exercises include activities that make you move against gravity while staying upright. Weight-bearing exercises can be high-impact or low-impact.  High-impact weight-bearing exercises help build bones and keep them strong. If you have broken a bone due to osteoporosis or are at risk of breaking a bone, you may need to avoid high-impact exercises. If you're not sure, you should check with your healthcare provider.  Examples of high-impact weight-bearing exercises are: Dancing  Doing high-impact aerobics  Hiking  Jogging/running  Jumping Rope  Stair climbing  Tennis  Low-impact weight-bearing exercises can also help keep bones strong and are a safe alternative if you cannot do high-impact exercises.   Examples of low-impact weight-bearing exercises are: Using elliptical training machines  Doing low-impact aerobics  Using stair-step machines  Fast walking on a treadmill or outside   Muscle-Strengthening Exercises These exercises include activities where you move your body, a weight or some other resistance against gravity. They are also known as resistance exercises and include: Lifting weights  Using elastic exercise bands  Using weight machines  Lifting your own body weight  Functional movements, such as standing and rising up on your toes  Yoga and Pilates can also improve strength, balance and flexibility. However, certain positions may not be safe for people with osteoporosis or those at increased risk of broken bones. For example, exercises that have you bend forward may increase the chance of  breaking a bone in the spine.   Non-Impact Exercises There are other types of exercises that can help prevent falls.  Non-impact exercises can help you to improve balance, posture and how well you move in everyday activities. Some of these exercises include: Balance exercises that strengthen your legs and test your balance, such as Tai Chi, can decrease your risk of falls.  Posture exercises that improve your posture and reduce rounded or "sloping" shoulders can help you decrease the chance of breaking a bone, especially in the spine.  Functional exercises that improve how well you move can help you with everyday activities and decrease your chance of falling and breaking a bone. For example, if you have trouble getting up from a chair or climbing stairs, you should do these activities as exercises.   **A physical therapist can teach you balance, posture and functional exercises. He/she can also help you learn which exercises are safe and appropriate for you.  [facility] has a physical therapy office in [place] in front of our office and referrals can be made for assessments and treatment as needed and strength and balance training.  If you would like to have an assessment with [facility] and our physical therapy team please let a nurse or provider know.   Fall Prevention and Home Safety Falls cause injuries and can affect all age groups. It is possible to use preventive measures to significantly decrease the likelihood of falls. There are many simple measures which can make your home safer and prevent falls. OUTDOORS  Repair cracks and edges of walkways and driveways.  Remove high doorway thresholds.  Trim shrubbery on the main path  into your home.  Have good outside lighting.  Clear walkways of tools, rocks, debris, and clutter.  Check that handrails are not broken and are securely fastened. Both sides of steps should have handrails.  Have leaves, snow, and ice cleared regularly.  Use  sand or salt on walkways during winter months.  In the garage, clean up grease or oil spills. BATHROOM  Install night lights.  Install grab bars by the toilet and in the tub and shower.  Use non-skid mats or decals in the tub or shower.  Place a plastic non-slip stool in the shower to sit on, if needed.  Keep floors dry and clean up all water on the floor immediately.  Remove soap buildup in the tub or shower on a regular basis.  Secure bath mats with non-slip, double-sided rug tape.  Remove throw rugs and tripping hazards from the floors. BEDROOMS  Install night lights.  Make sure a bedside light is easy to reach.  Do not use oversized bedding.  Keep a telephone by your bedside.  Have a firm chair with side arms to use for getting dressed.  Remove throw rugs and tripping hazards from the floor. KITCHEN  Keep handles on pots and pans turned toward the center of the stove. Use back burners when possible.  Clean up spills quickly and allow time for drying.  Avoid walking on wet floors.  Avoid hot utensils and knives.  Position shelves so they are not too high or low.  Place commonly used objects within easy reach.  If necessary, use a sturdy step stool with a grab bar when reaching.  Keep electrical cables out of the way.  Do not use floor polish or wax that makes floors slippery. If you must use wax, use non-skid floor wax.  Remove throw rugs and tripping hazards from the floor. STAIRWAYS  Never leave objects on stairs.  Place handrails on both sides of stairways and use them. Fix any loose handrails. Make sure handrails on both sides of the stairways are as long as the stairs.  Check carpeting to make sure it is firmly attached along stairs. Make repairs to worn or loose carpet promptly.  Avoid placing throw rugs at the top or bottom of stairways, or properly secure the rug with carpet tape to prevent slippage. Get rid of throw rugs, if possible.  Have  an electrician put in a light switch at the top and bottom of the stairs. OTHER FALL PREVENTION TIPS  Wear low-heel or rubber-soled shoes that are supportive and fit well. Wear closed toe shoes.  When using a stepladder, make sure it is fully opened and both spreaders are firmly locked. Do not climb a closed stepladder.  Add color or contrast paint or tape to grab bars and handrails in your home. Place contrasting color strips on first and last steps.  Learn and use mobility aids as needed. Install an electrical emergency response system.  Turn on lights to avoid dark areas. Replace light bulbs that burn out immediately. Get light switches that glow.  Arrange furniture to create clear pathways. Keep furniture in the same place.  Firmly attach carpet with non-skid or double-sided tape.  Eliminate uneven floor surfaces.  Select a carpet pattern that does not visually hide the edge of steps.  Be aware of all pets. OTHER HOME SAFETY TIPS  Set the water temperature for 120 F (48.8 C).  Keep emergency numbers on or near the telephone.  Keep smoke detectors on every  level of the home and near sleeping areas. Document Released: 01/14/2002 Document Revised: 07/26/2011 Document Reviewed: 04/15/2011 [facility] Patient Information 2015 [place], [place]. This information is not intended to replace advice given to you by your health care provider. Make sure you discuss any questions you have with your health care provider.

## 2014-06-16 ENCOUNTER — Telehealth: Payer: Self-pay | Admitting: Family Medicine

## 2014-06-24 ENCOUNTER — Telehealth: Payer: Self-pay | Admitting: Family Medicine

## 2014-06-24 NOTE — Telephone Encounter (Signed)
SENT TO TRIAGE.

## 2014-06-24 NOTE — Telephone Encounter (Signed)
Pt called wanting RX for antibiotic for sinus pressure, headache and drainage She is unable to come in for appt Please advise

## 2014-06-24 NOTE — Telephone Encounter (Signed)
I have never saw this patient according to Encounters. She ntbs in office in order to be prescribed antibiotic  Taiz Bickle A. Chauncey ReadingGann PA-C

## 2014-06-25 NOTE — Telephone Encounter (Signed)
Returned call to pt, informed her that she really needed to be seen since last seen in Feb., offered her an appt today. She states she does not have the time to come since her husband just came home from having surgery. Suggested nasal spray & OTC sinus meds.

## 2014-12-02 ENCOUNTER — Ambulatory Visit (INDEPENDENT_AMBULATORY_CARE_PROVIDER_SITE_OTHER): Payer: BLUE CROSS/BLUE SHIELD | Admitting: Pediatrics

## 2014-12-02 ENCOUNTER — Other Ambulatory Visit: Payer: Self-pay | Admitting: Gastroenterology

## 2014-12-02 VITALS — BP 152/85 | HR 81 | Temp 97.3°F | Ht 61.0 in | Wt 156.0 lb

## 2014-12-02 DIAGNOSIS — I10 Essential (primary) hypertension: Secondary | ICD-10-CM | POA: Diagnosis not present

## 2014-12-02 DIAGNOSIS — J019 Acute sinusitis, unspecified: Secondary | ICD-10-CM

## 2014-12-02 DIAGNOSIS — R109 Unspecified abdominal pain: Secondary | ICD-10-CM

## 2014-12-02 DIAGNOSIS — R1011 Right upper quadrant pain: Secondary | ICD-10-CM | POA: Diagnosis not present

## 2014-12-02 LAB — POCT URINALYSIS DIPSTICK
BILIRUBIN UA: NEGATIVE
Blood, UA: NEGATIVE
GLUCOSE UA: NEGATIVE
KETONES UA: NEGATIVE
Leukocytes, UA: NEGATIVE
Nitrite, UA: NEGATIVE
PROTEIN UA: NEGATIVE
Urobilinogen, UA: NEGATIVE
pH, UA: 6.5

## 2014-12-02 LAB — POCT UA - MICROSCOPIC ONLY
Bacteria, U Microscopic: NEGATIVE
CASTS, UR, LPF, POC: NEGATIVE
Crystals, Ur, HPF, POC: NEGATIVE
MUCUS UA: NEGATIVE
RBC, urine, microscopic: NEGATIVE
WBC, Ur, HPF, POC: NEGATIVE
Yeast, UA: NEGATIVE

## 2014-12-02 MED ORDER — AZITHROMYCIN 250 MG PO TABS: ORAL_TABLET | ORAL | Status: DC

## 2014-12-02 MED ORDER — LISINOPRIL 20 MG PO TABS: 10.0000 mg | ORAL_TABLET | Freq: Every day | ORAL | Status: DC

## 2014-12-02 NOTE — Patient Instructions (Addendum)
Neti pot twice a day for sinus relief. Use distilled water. Flonase twice a day after using the neti pot

## 2014-12-02 NOTE — Progress Notes (Signed)
Subjective:    Patient ID: Amy Merritt, female    DOB: May 25, 1960, 54 y.o.   MRN: [id number]  CC: abd pain, congestion  HPI: Amy Merritt is a 54 y.o. female presenting on 12/02/2014 for abd pain, congestion. Congested for the past week Has been sick on her stomach for the last two weeks Taking acid reflux meds, GERD feels different than the pain she is having Has pain primarily on the L side, has had R sided abd pain. Has h/o kidney stone pain Having regularly elevated BPs at pain clinic recently, SBPs 150s No fevers. Normal stools. Appetite down because of nausea.  Relevant past medical, surgical, family and social history reviewed and updated as indicated. Interim medical history since our last visit reviewed. Allergies and medications reviewed and updated.   ROS: Per HPI unless specifically indicated above  Past Medical History Patient Active Problem List   Diagnosis Date Noted  . Otitis, externa, infective 11/02/2012  . Lt flank pain 11/02/2012  . Frequency of urination 08/20/2012  . Pyelonephritis 08/04/2012  . Hypokalemia 08/04/2012  . Anemia 08/04/2012  . Pedal edema 05/21/2012  . HTN (hypertension) 05/21/2012  . Back pain, chronic 05/21/2012  . ADD (attention deficit disorder) 05/21/2012  . HLD (hyperlipidemia) 05/21/2012  . Depression 05/21/2012  . Insomnia 05/21/2012    Current Outpatient Prescriptions  Medication Sig Dispense Refill  . ALPRAZolam (XANAX) 1 MG tablet Take 1 mg by mouth 4 (four) times daily as needed. ANXIETY     . amphetamine-dextroamphetamine (ADDERALL XR) 15 MG 24 hr capsule Take 1 capsule by mouth daily.  0  . aspirin 81 MG tablet Take 1 tablet (81 mg total) by mouth daily. 30 tablet 11  . docusate sodium (COLACE) 100 MG capsule Take 300 mg by mouth daily.    Marland Kitchen gabapentin (NEURONTIN) 400 MG capsule Take 400 mg by mouth 2 (two) times daily.     . Glucosamine-Chondroitin (GLUCOSAMINE CHONDR COMPLEX PO) Take 1 tablet by mouth 2  (two) times daily.     . Multiple Vitamin (MULTIVITAMIN) tablet Take 1 tablet by mouth daily.    . naproxen (NAPROSYN) 500 MG tablet Take 500 mg by mouth 2 (two) times daily with a meal.    . oxycodone (ROXICODONE) 30 MG immediate release tablet Take 30 mg by mouth every 4 (four) hours as needed (Dr Nicholaus Bloom). PAIN    . venlafaxine (EFFEXOR) 75 MG tablet Take 2 caplets by mouth in the morning and 1 in the evening    . azithromycin (ZITHROMAX) 250 MG tablet Take 2 the first day and then one each day after. 6 tablet 0  . lisinopril (PRINIVIL,ZESTRIL) 20 MG tablet Take 1 tablet (20 mg total) by mouth daily. 90 tablet 1  . pantoprazole (PROTONIX) 40 MG tablet TAKE 1 BY MOUTH TWICE DAILY BEFORE A MEAL 180 tablet 0   No current facility-administered medications for this visit.       Objective:    BP 152/85 mmHg  Pulse 81  Temp(Src) 97.3 F (36.3 C) (Oral)  Ht _0  (1.549 m)  Wt 156 lb (70.761 kg)  BMI 29.49 kg/m2  Wt Readings from Last 3 Encounters:  12/02/14 156 lb (70.761 kg)  05/28/14 150 lb (68.04 kg)  03/17/14 150 lb 6.4 oz (68.221 kg)    Gen: NAD, alert, cooperative with exam, NCAT EYES: EOMI, no scleral injection or icterus ENT:  TMs pearly gray b/l, OP with mild erythema, tender over max/frontal sinuses LYMPH:  no cervical LAD CV: NRRR, normal S1/S2, no murmur, WWP Resp: CTABL, no wheezes, normal WOB Abd: +BS, soft, tender RUQ with palpation, also L side, neg murphy's sign, ND. no guarding or organomegaly Ext: No edema, warm Neuro: Alert and oriented, strength equal b/l UE and LE, coordination grossly normal MSK: normal muscle bulk     Assessment & Plan:   Amy Merritt was seen today for RUQ pain and sinusitis, also with intermittent L sided pain. Stooling regularly, appetite has been decreased, still eatin gand drinking some. Will start with labs, abd u/s, treat acute sinusitis.   Diagnoses and all orders for this visit:  Abdominal pain, right upper quadrant, L side -      CMP14+EGFR -     Lipase -     CBC -     US Abdomen Limited RUQ; Future -     POCT urinalysis dipstick -     POCT UA - Microscopic Only  Acute sinusitis, recurrence not specified, unspecified location -     azithromycin (ZITHROMAX) 250 MG tablet; Take 2 the first day and then one each day after.  Essential hypertension Checking labs today, elevated here and at other clinics recently. Start taking full tab, not just half of lisinopril.  -    lisinopril (PRINIVIL,ZESTRIL) 20 MG tablet; increase to one full tab daily.  Follow up plan: Return in about 2 weeks (around 12/16/2014), or if symptoms worsen or fail to improve.  Assunta Found, MD [facility] 12/02/2014, 10:20 AM

## 2014-12-03 LAB — CBC
HEMOGLOBIN: 12.3 g/dL (ref 11.1–15.9)
Hematocrit: 37.5 % (ref 34.0–46.6)
MCH: 31.6 pg (ref 26.6–33.0)
MCHC: 32.8 g/dL (ref 31.5–35.7)
MCV: 96 fL (ref 79–97)
PLATELETS: 348 10*3/uL (ref 150–379)
RBC: 3.89 x10E6/uL (ref 3.77–5.28)
RDW: 14.1 % (ref 12.3–15.4)
WBC: 6 10*3/uL (ref 3.4–10.8)

## 2014-12-03 LAB — CMP14+EGFR
ALBUMIN: 3.9 g/dL (ref 3.5–5.5)
ALT: 22 IU/L (ref 0–32)
AST: 19 IU/L (ref 0–40)
Albumin/Globulin Ratio: 1.4 (ref 1.1–2.5)
Alkaline Phosphatase: 132 IU/L — ABNORMAL HIGH (ref 39–117)
BUN / CREAT RATIO: 8 — AB (ref 9–23)
BUN: 6 mg/dL (ref 6–24)
Bilirubin Total: 0.2 mg/dL (ref 0.0–1.2)
CALCIUM: 9.3 mg/dL (ref 8.7–10.2)
CO2: 26 mmol/L (ref 18–29)
CREATININE: 0.73 mg/dL (ref 0.57–1.00)
Chloride: 99 mmol/L (ref 97–106)
GFR, EST AFRICAN AMERICAN: 110 mL/min/{1.73_m2} (ref >59)
GFR, EST NON AFRICAN AMERICAN: 95 mL/min/{1.73_m2} (ref >59)
GLUCOSE: 89 mg/dL (ref 65–99)
Globulin, Total: 2.7 g/dL (ref 1.5–4.5)
Potassium: 4.6 mmol/L (ref 3.5–5.2)
Sodium: 141 mmol/L (ref 136–144)
TOTAL PROTEIN: 6.6 g/dL (ref 6.0–8.5)

## 2014-12-03 LAB — LIPASE: LIPASE: 28 U/L (ref 0–59)

## 2014-12-04 ENCOUNTER — Telehealth: Payer: Self-pay | Admitting: Family Medicine

## 2014-12-04 DIAGNOSIS — I10 Essential (primary) hypertension: Secondary | ICD-10-CM

## 2014-12-04 MED ORDER — LISINOPRIL 20 MG PO TABS: 20.0000 mg | ORAL_TABLET | Freq: Every day | ORAL | Status: DC

## 2014-12-04 NOTE — Telephone Encounter (Signed)
Patient was taking Lisinopril 10mg . This was increased to 20mg  at her last visit but when the prescription was changed to 20mg  the computer automatically changed the directions to 1/2 tablet.  [facility]  1 tab daily #90 with 1 refill.  She will take a whole tablet of the 20mg  that she picked up from the local pharmacy. Patient understands and is agreeable.

## 2014-12-04 NOTE — Progress Notes (Signed)
Patient aware.

## 2014-12-05 ENCOUNTER — Encounter: Payer: Self-pay | Admitting: Pediatrics

## 2014-12-08 ENCOUNTER — Ambulatory Visit (HOSPITAL_COMMUNITY): Admission: RE | Admit: 2014-12-08 | Payer: BLUE CROSS/BLUE SHIELD | Source: Ambulatory Visit

## 2014-12-08 ENCOUNTER — Ambulatory Visit (HOSPITAL_COMMUNITY): Payer: BLUE CROSS/BLUE SHIELD

## 2014-12-10 ENCOUNTER — Ambulatory Visit (HOSPITAL_COMMUNITY)
Admission: RE | Admit: 2014-12-10 | Discharge: 2014-12-10 | Disposition: A | Discharge status: Home / Self Care | Payer: BLUE CROSS/BLUE SHIELD | Source: Ambulatory Visit | Attending: Pediatrics | Admitting: Pediatrics

## 2014-12-10 DIAGNOSIS — R110 Nausea: Secondary | ICD-10-CM | POA: Insufficient documentation

## 2014-12-10 DIAGNOSIS — R109 Unspecified abdominal pain: Secondary | ICD-10-CM | POA: Diagnosis not present

## 2014-12-10 DIAGNOSIS — R1011 Right upper quadrant pain: Secondary | ICD-10-CM

## 2014-12-11 ENCOUNTER — Telehealth: Payer: Self-pay | Admitting: Pediatrics

## 2014-12-11 NOTE — Telephone Encounter (Signed)
Patient aware of lab results and she is also requesting the results of her ultrasound.

## 2014-12-12 NOTE — Telephone Encounter (Signed)
Patient is calling wanting to know what the next step is. She states she is still having the pain and wants to see what you recommend

## 2014-12-12 NOTE — Telephone Encounter (Signed)
Talked with pt, still has ongoing L sided abdominal pain, no fevers, normal bowel movements. Labs from 10/25 unremarkable. Will bring her back Monday for KUB, recheck urine, scheduled for 930am. If normal likely needs CT scan. If fevers, worsening pain over the weekend she is to go to ED. Pt voiced understanding.

## 2014-12-15 ENCOUNTER — Ambulatory Visit: Payer: BLUE CROSS/BLUE SHIELD | Admitting: Pediatrics

## 2014-12-17 ENCOUNTER — Encounter: Payer: Self-pay | Admitting: Pediatrics

## 2014-12-17 ENCOUNTER — Ambulatory Visit (INDEPENDENT_AMBULATORY_CARE_PROVIDER_SITE_OTHER): Payer: BLUE CROSS/BLUE SHIELD

## 2014-12-17 ENCOUNTER — Ambulatory Visit (INDEPENDENT_AMBULATORY_CARE_PROVIDER_SITE_OTHER): Payer: BLUE CROSS/BLUE SHIELD | Admitting: Pediatrics

## 2014-12-17 VITALS — BP 141/83 | HR 77 | Temp 98.3°F | Ht 61.0 in | Wt 162.0 lb

## 2014-12-17 DIAGNOSIS — R1032 Left lower quadrant pain: Secondary | ICD-10-CM

## 2014-12-17 DIAGNOSIS — K5900 Constipation, unspecified: Secondary | ICD-10-CM

## 2014-12-17 LAB — POCT URINALYSIS DIPSTICK
Bilirubin, UA: NEGATIVE
Glucose, UA: NEGATIVE
KETONES UA: NEGATIVE
Leukocytes, UA: NEGATIVE
Nitrite, UA: NEGATIVE
PROTEIN UA: NEGATIVE
RBC UA: NEGATIVE
SPEC GRAV UA: 1.01
UROBILINOGEN UA: NEGATIVE
pH, UA: 6

## 2014-12-17 LAB — POCT UA - MICROSCOPIC ONLY
Bacteria, U Microscopic: NEGATIVE
CASTS, UR, LPF, POC: NEGATIVE
CRYSTALS, UR, HPF, POC: NEGATIVE
Mucus, UA: NEGATIVE
RBC, urine, microscopic: NEGATIVE
WBC, Ur, HPF, POC: NEGATIVE
Yeast, UA: NEGATIVE

## 2014-12-17 MED ORDER — LUBIPROSTONE 24 MCG PO CAPS: 24.0000 ug | ORAL_CAPSULE | Freq: Two times a day (BID) | ORAL | Status: DC

## 2014-12-17 NOTE — Progress Notes (Signed)
Subjective:    Patient ID: Amy Merritt, female    DOB: Oct 01, 1960, 54 y.o.   MRN: [id number]  CC: abd pain f/u  HPI: Amy Merritt is a 54 y.o. female presenting on 12/17/2014 for abdominal pain  One day of vomiting diarrhea last weekend. Now spitting up brown substance at times No epigastric pain Pain no worse when she eats. Feels sick often, some nausea every day, doesn't eat when she feels nauseated. L sided abdominal pain, starts at flank, goes around to LLQ Sore to touch Has been going on for several months, much worse last two weeks Now needing to stop what she is doing at times, pain is worst now when she moves or gets up Eating dinner at night but not much because dec appetite, grazing for rest of day Has neck pain occasionally Has had swelling for past few days in LE worse than usual. Has had swelling more this past week, though has been going on for some time. Takes chronic narcotic pain medicine.  Relevant past medical, surgical, family and social history reviewed and updated as indicated. Interim medical history since our last visit reviewed. Allergies and medications reviewed and updated.   ROS: Per HPI unless specifically indicated above  Past Medical History Patient Active Problem List   Diagnosis Date Noted  . Otitis, externa, infective 11/02/2012  . Lt flank pain 11/02/2012  . Frequency of urination 08/20/2012  . Pyelonephritis 08/04/2012  . Hypokalemia 08/04/2012  . Anemia 08/04/2012  . Pedal edema 05/21/2012  . HTN (hypertension) 05/21/2012  . Back pain, chronic 05/21/2012  . ADD (attention deficit disorder) 05/21/2012  . HLD (hyperlipidemia) 05/21/2012  . Depression 05/21/2012  . Insomnia 05/21/2012    Current Outpatient Prescriptions  Medication Sig Dispense Refill  . ALPRAZolam (XANAX) 1 MG tablet Take 1 mg by mouth 4 (four) times daily as needed. ANXIETY     . amphetamine-dextroamphetamine (ADDERALL XR) 15 MG 24 hr capsule Take 1  capsule by mouth daily.  0  . aspirin 81 MG tablet Take 1 tablet (81 mg total) by mouth daily. 30 tablet 11  . docusate sodium (COLACE) 100 MG capsule Take 300 mg by mouth daily.    Marland Kitchen. gabapentin (NEURONTIN) 400 MG capsule Take 400 mg by mouth 2 (two) times daily.     . Glucosamine-Chondroitin (GLUCOSAMINE CHONDR COMPLEX PO) Take 1 tablet by mouth 2 (two) times daily.     Marland Kitchen. lisinopril (PRINIVIL,ZESTRIL) 20 MG tablet Take 1 tablet (20 mg total) by mouth daily. 90 tablet 1  . Multiple Vitamin (MULTIVITAMIN) tablet Take 1 tablet by mouth daily.    . naproxen (NAPROSYN) 500 MG tablet Take 500 mg by mouth 2 (two) times daily with a meal.    . oxycodone (ROXICODONE) 30 MG immediate release tablet Take 30 mg by mouth every 4 (four) hours as needed (Dr Thyra BreedMark Phillips). PAIN    . pantoprazole (PROTONIX) 40 MG tablet TAKE 1 BY MOUTH TWICE DAILY BEFORE A MEAL 180 tablet 0  . venlafaxine (EFFEXOR) 75 MG tablet Take 2 caplets by mouth in the morning and 1 in the evening    . lubiprostone (AMITIZA) 24 MCG capsule Take 1 capsule (24 mcg total) by mouth 2 (two) times daily with a meal. 60 capsule 1   No current facility-administered medications for this visit.       Objective:    BP 141/83 mmHg  Pulse 77  Temp(Src) 98.3 F (36.8 C) (Oral)  Ht 5\' 1"  (  1.549 m)  Wt 162 lb (73.483 kg)  BMI 30.63 kg/m2  Wt Readings from Last 3 Encounters:  12/17/14 162 lb (73.483 kg)  12/02/14 156 lb (70.761 kg)  05/28/14 150 lb (68.04 kg)    Gen: NAD, alert, cooperative with exam, NCAT EYES: EOMI, no scleral injection or icterus ENT:  OP without erythema LYMPH: no cervical LAD CV: NRRR, normal S1/S2, no murmur, distal pulses 2+ b/l Resp: CTABL, no wheezes, normal WOB Abd: +BS, soft, tender RLQ with deep palpation, no masses, neg psoas/obturator, no rebound. No organomegaly, no CVA tenderness Ext: 1+ pitting edema anterior shin Neuro: Alert and oriented MSK: normal ROM b/l hips  Preliminary read by Rex Kras,  MD: fair amount of stool on xray, no obstruction, metal rods/screws present in back      Assessment & Plan:    Amy Merritt was seen today for abd pain, xray with large amount of stool. UA negative. Will try bowel cleanout, see pt instructions, pt on chronic narcotics, will do trial of amitiza. Has swelling that comes and goes, over months, will need further discussion at f/u appt.  Diagnoses and all orders for this visit:  Left lower quadrant pain -     DG Abd 1 View; Future -     POCT UA - Microscopic Only -     POCT urinalysis dipstick -     CBC -     Hepatic function panel  Constipation, unspecified constipation type -     lubiprostone (AMITIZA) 24 MCG capsule; Take 1 capsule (24 mcg total) by mouth 2 (two) times daily with a meal.    Follow up plan: 2-4 weeks for swelling and abd pain f/u  Rex Kras, MD [facility] 12/17/2014, 12:43 PM

## 2014-12-17 NOTE — Addendum Note (Signed)
Addended by: Johna SheriffVINCENT, Yalena Colon L on: 12/17/2014 07:23 PM   Modules accepted: Level of Service

## 2014-12-17 NOTE — Patient Instructions (Signed)
Colace twice a day Bisacodyl two after starting the miralax Miralax every 1-2 hours until stools are clear Drink liquids

## 2014-12-18 LAB — HEPATIC FUNCTION PANEL
ALBUMIN: 3.9 g/dL (ref 3.5–5.5)
ALT: 22 IU/L (ref 0–32)
AST: 25 IU/L (ref 0–40)
Alkaline Phosphatase: 112 IU/L (ref 39–117)
Bilirubin, Direct: 0.05 mg/dL (ref 0.00–0.40)
TOTAL PROTEIN: 6.2 g/dL (ref 6.0–8.5)

## 2014-12-18 LAB — CBC
HEMATOCRIT: 34.3 % (ref 34.0–46.6)
HEMOGLOBIN: 11.1 g/dL (ref 11.1–15.9)
MCH: 31.8 pg (ref 26.6–33.0)
MCHC: 32.4 g/dL (ref 31.5–35.7)
MCV: 98 fL — ABNORMAL HIGH (ref 79–97)
Platelets: 304 10*3/uL (ref 150–379)
RBC: 3.49 x10E6/uL — AB (ref 3.77–5.28)
RDW: 13.9 % (ref 12.3–15.4)
WBC: 4.8 10*3/uL (ref 3.4–10.8)

## 2014-12-22 ENCOUNTER — Telehealth: Payer: Self-pay | Admitting: Family Medicine

## 2014-12-22 NOTE — Telephone Encounter (Signed)
Amitiza Rx was sent into pharmacy, but it is pt's mail order. She is going to see how long it will be before she gets that sent to her. Will let us know if we need to send it into a local pharmacy after talking to her mail order.

## 2014-12-23 ENCOUNTER — Telehealth: Payer: Self-pay | Admitting: Pediatrics

## 2014-12-23 NOTE — Telephone Encounter (Signed)
Informed patient to take medication with food

## 2014-12-24 ENCOUNTER — Telehealth: Payer: Self-pay | Admitting: Pediatrics

## 2014-12-24 NOTE — Telephone Encounter (Signed)
Question answered. 

## 2014-12-26 ENCOUNTER — Telehealth: Payer: Self-pay | Admitting: Family Medicine

## 2014-12-26 NOTE — Telephone Encounter (Signed)
Patient called back and stated her mother in law passed away and that she is getting ready to go out of town and she wants to know what you recommend.

## 2014-12-26 NOTE — Telephone Encounter (Signed)
Patient wants to know when she can start trying solids again and wanted to see where you wanting to do another xray. Please advise

## 2014-12-26 NOTE — Telephone Encounter (Signed)
Patient informed that she can start eating solid foods and to make sure she is drinking miralax.  Patient stated that abdominal pain and constipation is better at this time.

## 2015-01-12 ENCOUNTER — Ambulatory Visit (INDEPENDENT_AMBULATORY_CARE_PROVIDER_SITE_OTHER): Payer: BLUE CROSS/BLUE SHIELD | Admitting: Pediatrics

## 2015-01-12 ENCOUNTER — Encounter: Payer: Self-pay | Admitting: Pediatrics

## 2015-01-12 ENCOUNTER — Ambulatory Visit (INDEPENDENT_AMBULATORY_CARE_PROVIDER_SITE_OTHER): Payer: BLUE CROSS/BLUE SHIELD

## 2015-01-12 VITALS — BP 114/75 | HR 79 | Temp 97.1°F | Ht 61.0 in | Wt 156.2 lb

## 2015-01-12 DIAGNOSIS — K5900 Constipation, unspecified: Secondary | ICD-10-CM | POA: Diagnosis not present

## 2015-01-12 MED ORDER — LINACLOTIDE 290 MCG PO CAPS: 290.0000 ug | ORAL_CAPSULE | Freq: Every day | ORAL | Status: DC

## 2015-01-12 NOTE — Progress Notes (Signed)
Subjective:    Patient ID: Amy Merritt, female    DOB: 09/09/1960, 54 y.o.   MRN: [id number]  CC: Follow-up constipation  HPI: Amy Merritt is a 54 y.o. female presenting for Follow-up  Took lots of miralax after last visit for constipation 1 month ago, had loose brown stools for a week. Was eating minimally during that time. Stools never did become clear. Pain improved a lot. If she doesn't take laxatives she doesn't go Stool softeners Appetite has been ok Docusate BID Miralax some daily unless she needs to be out of the house Father-in-law died last night, mother-in-law died two weeks ago Sometimes still feels nauseated after eating Taking amitiza twice a day, has not noticed any difference   [facility] 01/12/2015 12/17/2014 12/02/2014 02/25/2014  Decreased Interest 0 0 0 1  Down, Depressed, Hopeless 0 0 0 0  PHQ - 2 Score 0 0 0 1     Relevant past medical, surgical, family and social history reviewed and updated as indicated. Interim medical history since our last visit reviewed. Allergies and medications reviewed and updated.    ROS: Per HPI unless specifically indicated above  History  Smoking status  . Former Smoker  . Quit date: 08/27/2010  Smokeless tobacco  . Never Used    Past Medical History Patient Active Problem List   Diagnosis Date Noted  . Otitis, externa, infective 11/02/2012  . Lt flank pain 11/02/2012  . Frequency of urination 08/20/2012  . Pyelonephritis 08/04/2012  . Hypokalemia 08/04/2012  . Anemia 08/04/2012  . Pedal edema 05/21/2012  . HTN (hypertension) 05/21/2012  . Back pain, chronic 05/21/2012  . ADD (attention deficit disorder) 05/21/2012  . HLD (hyperlipidemia) 05/21/2012  . Depression 05/21/2012  . Insomnia 05/21/2012    Current Outpatient Prescriptions  Medication Sig Dispense Refill  . ALPRAZolam (XANAX) 1 MG tablet Take 1 mg by mouth 4 (four) times daily as needed. ANXIETY     .  amphetamine-dextroamphetamine (ADDERALL XR) 15 MG 24 hr capsule Take 1 capsule by mouth daily.  0  . aspirin 81 MG tablet Take 1 tablet (81 mg total) by mouth daily. 30 tablet 11  . docusate sodium (COLACE) 100 MG capsule Take 300 mg by mouth daily.    Marland Kitchen. gabapentin (NEURONTIN) 400 MG capsule Take 400 mg by mouth 2 (two) times daily.     . Glucosamine-Chondroitin (GLUCOSAMINE CHONDR COMPLEX PO) Take 1 tablet by mouth 2 (two) times daily.     Marland Kitchen. lisinopril (PRINIVIL,ZESTRIL) 20 MG tablet Take 1 tablet (20 mg total) by mouth daily. 90 tablet 1  . Multiple Vitamin (MULTIVITAMIN) tablet Take 1 tablet by mouth daily.    . naproxen (NAPROSYN) 500 MG tablet Take 500 mg by mouth 2 (two) times daily with a meal.    . oxycodone (ROXICODONE) 30 MG immediate release tablet Take 30 mg by mouth every 4 (four) hours as needed (Dr Thyra BreedMark Phillips). PAIN    . pantoprazole (PROTONIX) 40 MG tablet TAKE 1 BY MOUTH TWICE DAILY BEFORE A MEAL 180 tablet 0  . venlafaxine (EFFEXOR) 75 MG tablet Take 2 caplets by mouth in the morning and 1 in the evening    . Linaclotide (LINZESS) 290 MCG CAPS capsule Take 1 capsule (290 mcg total) by mouth daily. 30 capsule 1   No current facility-administered medications for this visit.       Objective:    BP 114/75 mmHg  Pulse 79  Temp(Src) 97.1 F (36.2  C) (Oral)  Ht  (1.549 m)  Wt 156 lb 3.2 oz (70.852 kg)  BMI 29.53 kg/m2  Wt Readings from Last 3 Encounters:  01/12/15 156 lb 3.2 oz (70.852 kg)  12/17/14 162 lb (73.483 kg)  12/02/14 156 lb (70.761 kg)     Gen: NAD, alert, cooperative with exam, NCAT EYES: EOMI, no scleral injection or icterus ENT:  TMs pearly gray b/l, OP without erythema LYMPH: no cervical LAD CV: NRRR, normal S1/S2, no murmur, distal pulses 2+ b/l Resp: CTABL, no wheezes, normal WOB Abd: +BS, soft, NTND. no guarding or organomegaly Ext: No edema, warm Neuro: Alert and oriented, strength equal b/l UE and LE, coordination grossly normal MSK:  normal muscle bulk  Preliminary KUB read by Rex Kras, MD: Still with stool, no obstruction      Assessment & Plan:    Jahne was seen today for follow-up abdominal pain. Pt on chronic narcotic therapy for back pain, up to  oxycodone 6 times a day. Still with stool burden on xray. Amitiza didn't make any different after 30 days and cost her $70. Will try linzess. Continue miralax, stool softener.  Diagnoses and all orders for this visit:  Constipation, unspecified constipation type -     DG Abd 1 View; Future -     Linaclotide (LINZESS) 290 MCG CAPS capsule; Take 1 capsule (290 mcg total) by mouth daily.  Follow up plan: 1 month  Rex Kras, MD [facility] 01/12/2015, 9:58 AM

## 2015-01-12 NOTE — Patient Instructions (Signed)
Drink lots of fluids Trial linzess for constipation Continue docusate and miralax as needed

## 2015-02-27 ENCOUNTER — Telehealth: Payer: Self-pay | Admitting: Pediatrics

## 2015-02-27 DIAGNOSIS — K5900 Constipation, unspecified: Secondary | ICD-10-CM

## 2015-02-27 NOTE — Telephone Encounter (Signed)
Patient wants to see if we can refer her to Dr. Sharmon Leyden for abdominal pain and constipation. Please advise

## 2015-03-02 NOTE — Telephone Encounter (Signed)
Patient aware that referral has been made.  

## 2015-03-02 NOTE — Telephone Encounter (Signed)
Ordered placed

## 2015-03-04 ENCOUNTER — Telehealth: Payer: Self-pay | Admitting: Gastroenterology

## 2015-03-04 NOTE — Telephone Encounter (Signed)
Patient rescheduled to 03/12/15 with Amy Esterwood PA for 1:30.  She is advised to continue her linzess and miralax

## 2015-03-12 ENCOUNTER — Encounter: Payer: Self-pay | Admitting: Physician Assistant

## 2015-03-12 ENCOUNTER — Ambulatory Visit (INDEPENDENT_AMBULATORY_CARE_PROVIDER_SITE_OTHER): Payer: BLUE CROSS/BLUE SHIELD | Admitting: Physician Assistant

## 2015-03-12 ENCOUNTER — Other Ambulatory Visit (INDEPENDENT_AMBULATORY_CARE_PROVIDER_SITE_OTHER): Payer: BLUE CROSS/BLUE SHIELD

## 2015-03-12 VITALS — BP 122/68 | HR 88 | Ht 60.25 in | Wt 150.1 lb

## 2015-03-12 DIAGNOSIS — R0781 Pleurodynia: Secondary | ICD-10-CM | POA: Diagnosis not present

## 2015-03-12 DIAGNOSIS — K5909 Other constipation: Secondary | ICD-10-CM

## 2015-03-12 DIAGNOSIS — R1012 Left upper quadrant pain: Secondary | ICD-10-CM

## 2015-03-12 LAB — COMPREHENSIVE METABOLIC PANEL
ALK PHOS: 102 U/L (ref 39–117)
ALT: 15 U/L (ref 0–35)
AST: 18 U/L (ref 0–37)
Albumin: 4 g/dL (ref 3.5–5.2)
BILIRUBIN TOTAL: 0.2 mg/dL (ref 0.2–1.2)
BUN: 9 mg/dL (ref 6–23)
CO2: 27 mEq/L (ref 19–32)
Calcium: 9 mg/dL (ref 8.4–10.5)
Chloride: 103 mEq/L (ref 96–112)
Creatinine, Ser: 0.9 mg/dL (ref 0.40–1.20)
GFR: 69.79 mL/min (ref >60.00)
Glucose, Bld: 89 mg/dL (ref 70–99)
POTASSIUM: 4.2 meq/L (ref 3.5–5.1)
SODIUM: 137 meq/L (ref 135–145)
Total Protein: 7.1 g/dL (ref 6.0–8.3)

## 2015-03-12 LAB — CBC WITH DIFFERENTIAL/PLATELET
BASOS ABS: 0 10*3/uL (ref 0.0–0.1)
Basophils Relative: 0.4 % (ref 0.0–3.0)
Eosinophils Absolute: 0.2 10*3/uL (ref 0.0–0.7)
Eosinophils Relative: 3 % (ref 0.0–5.0)
HCT: 38.2 % (ref 36.0–46.0)
Hemoglobin: 12.7 g/dL (ref 12.0–15.0)
LYMPHS ABS: 2.3 10*3/uL (ref 0.7–4.0)
Lymphocytes Relative: 29.5 % (ref 12.0–46.0)
MCHC: 33.3 g/dL (ref 30.0–36.0)
MCV: 94.3 fl (ref 78.0–100.0)
MONO ABS: 0.8 10*3/uL (ref 0.1–1.0)
Monocytes Relative: 9.6 % (ref 3.0–12.0)
NEUTROS ABS: 4.5 10*3/uL (ref 1.4–7.7)
NEUTROS PCT: 57.5 % (ref 43.0–77.0)
PLATELETS: 322 10*3/uL (ref 150.0–400.0)
RBC: 4.04 Mil/uL (ref 3.87–5.11)
RDW: 13.2 % (ref 11.5–15.5)
WBC: 7.8 10*3/uL (ref 4.0–10.5)

## 2015-03-12 LAB — SEDIMENTATION RATE: SED RATE: 11 mm/h (ref 0–22)

## 2015-03-12 LAB — HIGH SENSITIVITY CRP: CRP, High Sensitivity: 3.63 mg/L (ref 0.000–5.000)

## 2015-03-12 NOTE — Patient Instructions (Signed)
Please go to the basement level to have your labs drawn.   Stop the Lactulose. Continue the Linzess 290 mcg daily. Continue the Miralax 17 grams in 8 0z of water daily. Continue the Protonix.  You have been scheduled for a CT scan of the abdomen and pelvis at [facility] Radiology department, first floor. Dennis Bast are scheduled on 03-16-2015 at 12:30 PM . You should arrive 15 minutes prior to your appointment time for registration. Please follow the written instructions below on the day of your exam:  WARNING: IF YOU ARE ALLERGIC TO IODINE/X-RAY DYE, PLEASE NOTIFY RADIOLOGY IMMEDIATELY AT [phone]! YOU WILL BE GIVEN A 13 HOUR PREMEDICATION PREP.  1) Do not eat after 8:30 am   (4 hours prior to your test) 2) You have been given 2 bottles of oral contrast to drink. The solution may taste  better if refrigerated, but do NOT add ice or any other liquid to this solution. Shake well before drinking.    Drink 1 bottle of contrast @ 10:30 am  (2 hours prior to your exam)  Drink 1 bottle of contrast @ 11:30 Am (1 hour prior to your exam)  You may take any medications as prescribed with a small amount of water except for the following: Metformin, Glucophage, Glucovance, Avandamet, Riomet, Fortamet, Actoplus Met, Janumet, Glumetza or Metaglip. The above medications must be held the day of the exam AND 48 hours after the exam.  The purpose of you drinking the oral contrast is to aid in the visualization of your intestinal tract. The contrast solution may cause some diarrhea. Before your exam is started, you will be given a small amount of fluid to drink. Depending on your individual set of symptoms, you may also receive an intravenous injection of x-ray contrast/dye. Plan on being at [facility] Radiology for 30 -45 minutes or longer, depending on the type of exam you are having performed.  If you have any questions regarding your exam or if you need to reschedule, you may call the CT department  at [phone] between the hours of 8:00 am and 5:00 pm, Monday-Friday.  ________________________________________________________________________

## 2015-03-12 NOTE — Progress Notes (Signed)
Reviewed and agree with management plan.  Blasa Raisch T. Nikholas Geffre, MD FACG 

## 2015-03-12 NOTE — Progress Notes (Signed)
Patient ID: Amy Merritt, female   DOB: [birth year], 55 y.o.   MRN: [id number]  Amy Merritt    Patient ID: Amy Merritt, female    DOB: 11-28-1960, 55 y.o.   MRN: [id number]  HPI Amy Merritt is a 55 year old white female known to Dr. Russella Dar. She had undergone colonoscopy in November 2014 which was a normal exam. She comes in today complaining of terrible constipation and left upper quadrant pain. Patient does have history of chronic constipation and has been on chronic narcotics for back pain. She had been tried on Amitiza by her PCP without any benefit and was then given a trial of Lynn's Lynn's S2 190 g by mouth daily. Says she took this for one month without much benefit and stopped it. She has just been placed back on it. She had also been taking MiraLAX as needed recently was started on Chronulac 30 mL twice daily by her PCP.   She has  history of 6 prior back surgeries and most recent surgery was rod placement. Patient says over the past 53 months she's been having significant problems with constipation and fairly constant left upper quadrant pain. Says she's unable to sleep on her back because with lying down her pain is worse, she feels better standing or lying on her left side. This pain is aggravated by movement coughing deep breaths. She is unaware of any injury. She has been focused on the constipation and says that her stools are now all totally liquid but her left-sided pain has not improved at all. She has not noted any melena or hematochezia. No fever or chills. She's been eating less though has not been losing any weight. Recent plain abdominal films had shown a moderate amount of stool in the colon no obstruction.  Review of Systems Pertinent positive and negative review of systems were noted in the above HPI section.  All other review of systems was otherwise negative.  Outpatient Encounter Prescriptions as of 03/12/2015  Medication Sig  . ADDERALL XR 20 MG 24 hr capsule Take 1 capsule  by mouth daily.  Marland Kitchen ALPRAZolam (XANAX) 1 MG tablet Take 1 mg by mouth 4 (four) times daily as needed. ANXIETY   . aspirin 81 MG tablet Take 1 tablet (81 mg total) by mouth daily.  . Cholecalciferol (VITAMIN D-3) 1000 units CAPS Take 1 capsule by mouth daily.  . [employer] OMEGA-3 KRILL OIL 300 MG CAPS Take 1 tablet by mouth daily.  Marland Kitchen gabapentin (NEURONTIN) 400 MG capsule Take 400 mg by mouth 2 (two) times daily.   . Glucosamine-Chondroitin (GLUCOSAMINE CHONDR COMPLEX PO) Take 1 tablet by mouth 2 (two) times daily.   Marland Kitchen lactulose (CHRONULAC) 10 GM/15ML solution Take 30 mLs by mouth 2 (two) times daily as needed.  . Linaclotide (LINZESS) 290 MCG CAPS capsule Take 1 capsule (290 mcg total) by mouth daily.  Marland Kitchen lisinopril (PRINIVIL,ZESTRIL) 20 MG tablet Take 1 tablet (20 mg total) by mouth daily.  . methocarbamol (ROBAXIN) 750 MG tablet Take 750 mg by mouth every 6 (six) hours as needed for muscle spasms.  . Multiple Vitamin (MULTIVITAMIN) tablet Take 1 tablet by mouth daily.  . naproxen (NAPROSYN) 500 MG tablet Take 500 mg by mouth 2 (two) times daily with a meal.  . oxycodone (ROXICODONE) 30 MG immediate release tablet Take 30 mg by mouth every 4 (four) hours as needed (Dr Thyra Breed). PAIN  . pantoprazole (PROTONIX) 40 MG tablet TAKE 1 BY MOUTH TWICE DAILY BEFORE A MEAL  .  polyethylene glycol powder (GLYCOLAX/MIRALAX) powder Take 17 g by mouth as needed.  . sennosides-docusate sodium (SENOKOT-S) 8.6-50 MG tablet Take 1 tablet by mouth daily.  Marland Kitchen venlafaxine XR (EFFEXOR-XR) 75 MG 24 hr capsule Take 2 capsules by mouth in the morning and 1 in the evening  . [DISCONTINUED] docusate sodium (COLACE) 100 MG capsule Take 300 mg by mouth daily.  . [DISCONTINUED] amphetamine-dextroamphetamine (ADDERALL XR) 15 MG 24 hr capsule Take 1 capsule by mouth daily.  . [DISCONTINUED] venlafaxine (EFFEXOR) 75 MG tablet Take 2 caplets by mouth in the morning and 1 in the evening   No facility-administered encounter  medications on file as of 03/12/2015.   Allergies  Allergen Reactions  . Morphine And Related Itching and Swelling    Liquid only   Patient Active Problem List   Diagnosis Date Noted  . Otitis, externa, infective 11/02/2012  . Lt flank pain 11/02/2012  . Frequency of urination 08/20/2012  . Pyelonephritis 08/04/2012  . Hypokalemia 08/04/2012  . Anemia 08/04/2012  . Pedal edema 05/21/2012  . HTN (hypertension) 05/21/2012  . Back pain, chronic 05/21/2012  . ADD (attention deficit disorder) 05/21/2012  . HLD (hyperlipidemia) 05/21/2012  . Depression 05/21/2012  . Insomnia 05/21/2012   Social History   Social History  . Marital Status: Married    Spouse Name: N/A  . Number of Children: 2  . Years of Education: N/A   Occupational History  . Not on file.   Social History Main Topics  . Smoking status: Former Smoker    Quit date: 08/27/2010  . Smokeless tobacco: Never Used  . Alcohol Use: No  . Drug Use: No  . Sexual Activity: Yes    Birth Control/ Protection: Surgical   Other Topics Concern  . Not on file   Social History Narrative    Amy Merritt's family history includes Cancer in her father; Colitis in her father; Diabetes in her father; Heart defect in her father; Heart disease in her mother; Hypertension in her brother, father, and mother. There is no history of Colon cancer, Rectal cancer, Stomach cancer, or Esophageal cancer.      Objective:    Filed Vitals:   03/12/15 1328  BP: 122/68  Pulse: 88    Physical Exam  well-developed white female in no acute distress, standing holding her left upper abdomen, accompanied by her husband blood pressure 122/68 pulse 88 height 5 foot weight 150. HEENT; nontraumatic cephalic EOMI PERRLA sclera anicteric Cardiovascular; regular rate and rhythm with S1-S2 no murmur or gallop, Pulmonary; clear bilaterally no tenderness to palpation along the left posterior flank or lower ribs, she has a long midline incision along her  spine Abdomen; soft nondistended bowel sounds are active is mild tenderness in the left upper quadrant and is more exquisitely tender over the left costal margin and left lower anterior ribs, Rectal; exam not done, Ext; no clubbing cyanosis or edema skin warm and dry, Neuropsych ;mood and affect appropriate     Assessment & Plan:   #1 55 yo female with chronic constipation-worse past 5 months- now only passing liquid while on lactulose and Linzess I suspect she has liquified stool with all the laxatives and is not constipated Will r/o impaction, occult lesion #2 severe LUQ pain- this is musculoskeletal by exam - R/O costochondritis, radicular pain #3 ADD  #4 chronic narcotic use #5 chronic back pain- s/p multiple spinal surgeries  Plan; Schedule for Ct abd/pelvis  Heat to left anterior ribs, trial  of OTC  lidocaine patch For now- continue Linzess 290 mcg daily, stop lactulose and go back to miralax 17 gm po daily CBC,BMET, CRP  Abeer Deskins S Joelle Ros[place] PA-C 03/12/2015   Cc: Johna Sheriff, MD

## 2015-03-16 ENCOUNTER — Ambulatory Visit (HOSPITAL_COMMUNITY)
Admission: RE | Admit: 2015-03-16 | Discharge: 2015-03-16 | Disposition: A | Discharge status: Home / Self Care | Payer: BLUE CROSS/BLUE SHIELD | Source: Ambulatory Visit | Attending: Physician Assistant | Admitting: Physician Assistant

## 2015-03-16 ENCOUNTER — Encounter (HOSPITAL_COMMUNITY): Payer: Self-pay

## 2015-03-16 DIAGNOSIS — R1012 Left upper quadrant pain: Secondary | ICD-10-CM | POA: Diagnosis not present

## 2015-03-16 DIAGNOSIS — K5909 Other constipation: Secondary | ICD-10-CM | POA: Insufficient documentation

## 2015-03-16 DIAGNOSIS — N858 Other specified noninflammatory disorders of uterus: Secondary | ICD-10-CM | POA: Insufficient documentation

## 2015-03-16 DIAGNOSIS — I2510 Atherosclerotic heart disease of native coronary artery without angina pectoris: Secondary | ICD-10-CM | POA: Insufficient documentation

## 2015-03-16 DIAGNOSIS — Z981 Arthrodesis status: Secondary | ICD-10-CM | POA: Diagnosis not present

## 2015-03-16 DIAGNOSIS — K838 Other specified diseases of biliary tract: Secondary | ICD-10-CM | POA: Insufficient documentation

## 2015-03-16 DIAGNOSIS — R0781 Pleurodynia: Secondary | ICD-10-CM | POA: Diagnosis present

## 2015-03-16 DIAGNOSIS — N8189 Other female genital prolapse: Secondary | ICD-10-CM | POA: Insufficient documentation

## 2015-03-16 MED ORDER — IOHEXOL 300 MG/ML  SOLN
100.0000 mL | Freq: Once | INTRAMUSCULAR | Status: AC | PRN
  Administered 2015-03-16: 100 mL via INTRAVENOUS

## 2015-03-20 ENCOUNTER — Other Ambulatory Visit: Payer: Self-pay | Admitting: Family Medicine

## 2015-03-20 ENCOUNTER — Other Ambulatory Visit: Payer: Self-pay | Admitting: Pediatrics

## 2015-03-20 DIAGNOSIS — K5900 Constipation, unspecified: Secondary | ICD-10-CM

## 2015-03-23 MED ORDER — LINACLOTIDE 290 MCG PO CAPS: 290.0000 ug | ORAL_CAPSULE | Freq: Every day | ORAL | Status: DC

## 2015-03-23 NOTE — Telephone Encounter (Signed)
done

## 2015-04-03 ENCOUNTER — Telehealth: Payer: Self-pay | Admitting: Family Medicine

## 2015-04-03 DIAGNOSIS — R931 Abnormal findings on diagnostic imaging of heart and coronary circulation: Secondary | ICD-10-CM

## 2015-04-03 NOTE — Telephone Encounter (Signed)
Refer to cardiology for Right coronary artery calcification on CT scan. Thanks, WS

## 2015-04-03 NOTE — Telephone Encounter (Signed)
No answer and couldn't leave VM, referral ordered already.

## 2015-04-03 NOTE — Addendum Note (Signed)
Addended by: Margurite Auerbach on: 04/03/2015 12:53 PM   Modules accepted: Orders

## 2015-04-03 NOTE — Telephone Encounter (Signed)
Will you review CT from 2-6 and give further instructions and route to Pool B

## 2015-04-06 ENCOUNTER — Ambulatory Visit: Payer: BLUE CROSS/BLUE SHIELD | Admitting: Gastroenterology

## 2015-04-14 ENCOUNTER — Telehealth: Payer: Self-pay | Admitting: Cardiology

## 2015-04-14 NOTE — Telephone Encounter (Signed)
[facility] 04[facility]23/15 with Dr Antoine PocheHochrein.  Records given to N Hines(medical records) for Dr Jenene SlickerHochrein's schedule on 04/23/15. lp

## 2015-04-17 NOTE — Telephone Encounter (Signed)
Patient has appointment with cardiologist on 3/16.

## 2015-04-22 NOTE — Progress Notes (Signed)
Cardiology Office Note   Date:  04/23/2015   ID:  Amy Merritt, DOB 06/01/1960, MRN [id number]  PCP:  Mechele ClaudeSTACKS,WARREN, MD  Cardiologist:   Rollene RotundaJames Tavon Magnussen, MD   Chief Complaint  Patient presents with  . Chest Pain      History of Present Illness: Amy Merritt is a 55 y.o. female who presents for evaluation of chest pain and coronary calcification.  She has a strong family history of CAD.  She is limited in her activities by back pain.  She recently had significant GI problems. As part of her evaluation she had a CAT scan of her abdomen and chest. She was found to have right coronary artery coronary calcification. She has had a negative dobutamine echocardiogram greater than a year ago. However, now she is having chest discomfort that is progressive. It's under her left breast. It does not radiate. It might last for several minutes to hours. She might get a little nauseated but this is not reproducible. It goes away on its own. Seems to be a little bit worse when she first lies down and goes away over time. She does not have shortness of breath with this, PND or orthopnea. The limited in activity she can do some stuff such as vacuuming and cannot bring this on. She does think this discomfort is getting more intense and has been going on for several months. Because of this and risk factors and coronary calcium she was referred.  Past Medical History  Diagnosis Date  . Headache(784.0)   . Arthritis   . PONV (postoperative nausea and vomiting)   . Lymphocytic colitis   . Depression   . IBS (irritable bowel syndrome)   . Chronic kidney disease   . Kidney stone   . DJD (degenerative joint disease)   . DJD (degenerative joint disease) of cervical spine   . DJD (degenerative joint disease), lumbar   . Hypertension     Past Surgical History  Procedure Laterality Date  . Lumbar disc surgery      x 4- lower back with rods and screws  . Tubal ligation    . Lithotrpsy    . Cervical  fusion      x 2  . Colonoscopy  02/11/2003     Current Outpatient Prescriptions  Medication Sig Dispense Refill  . ADDERALL XR 20 MG 24 hr capsule Take 1 capsule by mouth daily.    Marland Kitchen. ALPRAZolam (XANAX) 1 MG tablet Take 1 mg by mouth 4 (four) times daily as needed. ANXIETY     . ASPIRIN ADULT LOW STRENGTH 81 MG EC tablet TAKE 1 BY MOUTH DAILY 90 tablet 0  . Cholecalciferol (VITAMIN D-3) 1000 units CAPS Take 1 capsule by mouth daily.    . [facility] OMEGA-3 KRILL OIL 300 MG CAPS Take 1 tablet by mouth daily.    Marland Kitchen. gabapentin (NEURONTIN) 400 MG capsule Take 400 mg by mouth 2 (two) times daily.     . Glucosamine-Chondroitin (GLUCOSAMINE CHONDR COMPLEX PO) Take 1 tablet by mouth 2 (two) times daily.     Marland Kitchen. lactulose (CHRONULAC) 10 GM/15ML solution Take 30 mLs by mouth 2 (two) times daily as needed.    . Linaclotide (LINZESS) 290 MCG CAPS capsule Take 1 capsule (290 mcg total) by mouth daily. 90 capsule 0  . lisinopril (PRINIVIL,ZESTRIL) 20 MG tablet Take 1 tablet (20 mg total) by mouth daily. 90 tablet 1  . methocarbamol (ROBAXIN) 750 MG tablet Take 750 mg by mouth  every 6 (six) hours as needed for muscle spasms.    . Multiple Vitamin (MULTIVITAMIN) tablet Take 1 tablet by mouth daily.    . naproxen (NAPROSYN) 500 MG tablet Take 500 mg by mouth 2 (two) times daily with a meal.    . oxycodone (ROXICODONE) 30 MG immediate release tablet Take 30 mg by mouth every 4 (four) hours as needed (Dr Thyra Breed). PAIN    . pantoprazole (PROTONIX) 40 MG tablet TAKE 1 BY MOUTH TWICE DAILY BEFORE A MEAL 180 tablet 0  . polyethylene glycol powder (GLYCOLAX/MIRALAX) powder Take 17 g by mouth as needed.    . sennosides-docusate sodium (SENOKOT-S) 8.6-50 MG tablet Take 1 tablet by mouth daily.    Marland Kitchen venlafaxine XR (EFFEXOR-XR) 75 MG 24 hr capsule Take 2 capsules by mouth in the morning and 1 in the evening     No current facility-administered medications for this visit.    Allergies:   Morphine and related     Social History:  The patient  reports that she quit smoking about 4 years ago. She has never used smokeless tobacco. She reports that she does not drink alcohol or use illicit drugs.   Family History:  The patient's  family history includes COPD in her paternal grandfather; Cancer in her father and maternal grandfather; Colitis in her father; Diabetes in her father; Heart attack in her mother; Heart attack (age of onset: 79) in her father; Heart disease in her paternal grandmother; Heart failure in her maternal grandmother and mother; Hypertension in her brother, father, and mother. There is no history of Colon cancer, Rectal cancer, Stomach cancer, or Esophageal cancer.    ROS:  Please see the history of present illness.   Otherwise, review of systems are positive for none.   All other systems are reviewed and negative.    PHYSICAL EXAM: VS:  BP 134/98 mmHg  Pulse 68  Ht  (1.549 m)  Wt 155 lb 1.6 oz (70.353 kg)  BMI 29.32 kg/m2 , BMI Body mass index is 29.32 kg/(m^2). GENERAL:  Well appearing HEENT:  Pupils equal round and reactive, fundi not visualized, oral mucosa unremarkable NECK:  No jugular venous distention, waveform within normal limits, carotid upstroke brisk and symmetric, no bruits, no thyromegaly LYMPHATICS:  No cervical, inguinal adenopathy LUNGS:  Clear to auscultation bilaterally BACK:  No CVA tenderness CHEST:  Unremarkable HEART:  PMI not displaced or sustained,S1 and S2 within normal limits, no S3, no S4, no clicks, no rubs, no murmurs ABD:  Flat, positive bowel sounds normal in frequency in pitch, no bruits, no rebound, no guarding, no midline pulsatile mass, no hepatomegaly, no splenomegaly EXT:  2 plus pulses throughout, no edema, no cyanosis no clubbing SKIN:  No rashes no nodules NEURO:  Cranial nerves II through XII grossly intact, motor grossly intact throughout PSYCH:  Cognitively intact, oriented to person place and time    EKG:  EKG is ordered  today. The ekg ordered today demonstrates sinus rhythm, rate 68, axis within normal limits, intervals within normal limits, no acute ST-T wave changes.   Recent Labs: 03/12/2015: ALT 15; BUN 9; Creatinine, Ser 0.90; Hemoglobin 12.7; Platelets 322.0; Potassium 4.2; Sodium 137    Lipid Panel    Component Value Date/Time   CHOL 235* 02/25/2014 1121   TRIG 89 02/25/2014 1121   TRIG 108 05/21/2012 1211   HDL 59 02/25/2014 1121   HDL 57 05/21/2012 1211   CHOLHDL 3.5 05/21/2012 1211   VLDL 22  05/21/2012 1211   LDLCALC 119* 05/21/2012 1211      Wt Readings from Last 3 Encounters:  04/23/15 155 lb 1.6 oz (70.353 kg)  03/12/15 150 lb 2 oz (68.096 kg)  01/12/15 156 lb 3.2 oz (70.852 kg)      Other studies Reviewed: Additional studies/ records that were reviewed today include: Stress echo. Review of the above records demonstrates:  Please see elsewhere in the note.     ASSESSMENT AND PLAN:  CHEST PAIN:  The patient has pain that is somewhat atypical. She does have risk factors with family history and she has coronary calcium. I think the pretest probability of obstructive coronary disease is moderate. The most useful imaging in this situation would be angiography with CT. This will allow [place] to avoid the invasive risk with this moderate pretest probability but had more sensitivity and specificity afforded by the stress echocardiogram.  HTN:    The blood pressure is at target. No change in medications is indicated. We will continue with therapeutic lifestyle changes (TLC).  DYSLIPIDEMIA:  Her LDL is mildly elevated.  Therapeutic threshold will be determined by presence or absence of of coronary artery disease.    Current medicines are reviewed at length with the patient today.  The patient does not have concerns regarding medircines.  The following changes have been made:  no change  Labs/ tests ordered today include:   Orders Placed This Encounter  Procedures  . CT Cardiac  Morph/Pulm Vein W/Cm&W/O Ca Score  . EKG 12-Lead     Disposition:   FU with me as needed.      Signed, Rollene Rotunda, MD  04/23/2015 12:49 PM    [facility]

## 2015-04-23 ENCOUNTER — Encounter: Payer: Self-pay | Admitting: Cardiology

## 2015-04-23 ENCOUNTER — Ambulatory Visit (INDEPENDENT_AMBULATORY_CARE_PROVIDER_SITE_OTHER): Payer: BLUE CROSS/BLUE SHIELD | Admitting: Cardiology

## 2015-04-23 VITALS — BP 134/98 | HR 68 | Ht 61.0 in | Wt 155.1 lb

## 2015-04-23 DIAGNOSIS — R079 Chest pain, unspecified: Secondary | ICD-10-CM | POA: Diagnosis not present

## 2015-04-23 NOTE — Patient Instructions (Signed)
Your physician recommends that you schedule a follow-up appointment in: As Needed  Your physician recommends that you schedule a Coronary CT Angio [facility]

## 2015-04-28 ENCOUNTER — Encounter: Payer: Self-pay | Admitting: Cardiology

## 2015-05-11 ENCOUNTER — Other Ambulatory Visit: Payer: Self-pay | Admitting: Pediatrics

## 2015-05-11 NOTE — Telephone Encounter (Signed)
Last seen 01/12/15  Dr Vincent 

## 2015-05-13 ENCOUNTER — Other Ambulatory Visit: Payer: Self-pay | Admitting: Family Medicine

## 2015-05-13 ENCOUNTER — Ambulatory Visit (HOSPITAL_COMMUNITY)
Admission: RE | Admit: 2015-05-13 | Discharge: 2015-05-13 | Disposition: A | Discharge status: Home / Self Care | Payer: BLUE CROSS/BLUE SHIELD | Source: Ambulatory Visit | Attending: Cardiology | Admitting: Cardiology

## 2015-05-13 DIAGNOSIS — J439 Emphysema, unspecified: Secondary | ICD-10-CM | POA: Diagnosis not present

## 2015-05-13 DIAGNOSIS — K449 Diaphragmatic hernia without obstruction or gangrene: Secondary | ICD-10-CM | POA: Diagnosis not present

## 2015-05-13 DIAGNOSIS — I2510 Atherosclerotic heart disease of native coronary artery without angina pectoris: Secondary | ICD-10-CM | POA: Diagnosis not present

## 2015-05-13 DIAGNOSIS — I700 Atherosclerosis of aorta: Secondary | ICD-10-CM | POA: Diagnosis not present

## 2015-05-13 DIAGNOSIS — R079 Chest pain, unspecified: Secondary | ICD-10-CM | POA: Insufficient documentation

## 2015-05-13 MED ORDER — IOPAMIDOL (ISOVUE-370) INJECTION 76%
INTRAVENOUS | Status: AC
  Administered 2015-05-13: 80 mL
  Filled 2015-05-13: qty 100

## 2015-05-13 MED ORDER — METOPROLOL TARTRATE 1 MG/ML IV SOLN
INTRAVENOUS | Status: AC
  Administered 2015-05-13: 5 mg via INTRAVENOUS
  Filled 2015-05-13: qty 5

## 2015-05-13 MED ORDER — NITROGLYCERIN 0.4 MG SL SUBL
SUBLINGUAL_TABLET | SUBLINGUAL | Status: AC
  Administered 2015-05-13: 0.8 mg via SUBLINGUAL
  Filled 2015-05-13: qty 2

## 2015-05-13 MED ORDER — METOPROLOL TARTRATE 1 MG/ML IV SOLN
5.0000 mg | Freq: Once | INTRAVENOUS | Status: AC
  Administered 2015-05-13: 5 mg via INTRAVENOUS

## 2015-05-13 MED ORDER — NITROGLYCERIN 0.4 MG SL SUBL
0.8000 mg | SUBLINGUAL_TABLET | SUBLINGUAL | Status: DC | PRN
  Administered 2015-05-13: 0.8 mg via SUBLINGUAL

## 2015-05-13 NOTE — Progress Notes (Addendum)
When getting out of chair pt c/o dizziness and had c/o L lower arm  Pain and numbness, escorted to BR gait slow but steady, states head spins when she turns it.  Will continue to monitor  Pt in waiting area

## 2015-05-15 ENCOUNTER — Telehealth: Payer: Self-pay | Admitting: Cardiology

## 2015-05-15 NOTE — Telephone Encounter (Signed)
Pt calling for cardiac CT results-pls call [phone]

## 2015-05-18 ENCOUNTER — Encounter: Payer: Self-pay | Admitting: *Deleted

## 2015-05-18 NOTE — Telephone Encounter (Signed)
Spoke to patient. Result given . Verbalized understanding Patient would like a copy sent to Dr Aldona BarWEIN and mail copy to her so she can go to her pain management Dr Devonne DoughtyPHILLIPS,and primary DR Darlyn ReadSTACKS  COMPLETED

## 2015-05-18 NOTE — Telephone Encounter (Signed)
Pt is calling back to get the results to her CT she had done on 4/5. Please f/u with her

## 2015-05-18 NOTE — Telephone Encounter (Signed)
-----   Message from Rollene RotundaJames Hochrein, MD sent at 05/15/2015  7:34 AM EDT ----- She has 40 - 50% stenosis in the LAD but not obstructive disease.  I would like to see her back in 2 months to discuss.  Call Ms. Advincula with the results and [facility]

## 2015-05-18 NOTE — Telephone Encounter (Signed)
Patient calling for testing results. Stated "they called me and left a voicemail to call back about my results on Friday."  I let the patient know I will send a note to Dr. Antoine PocheHochrein and his nurse letting them know she called back for results.

## 2015-06-10 ENCOUNTER — Telehealth: Payer: Self-pay | Admitting: Cardiology

## 2015-06-10 NOTE — Telephone Encounter (Signed)
Spoke with pt, she has had swelling in her feet and legs for several months but for the last couple weeks it has gotten worse. It occurs by the end of the day after being on her feet. She reports her legs are swollen, feel tight and are painful. She reports a little SOB but thinks maybe due to allergies, denies orthopnea. She elevates her legs when she can and she avoids salt. She also is concerned about waiting a month to see dr hochrein. She has discussed the results of her CT with another physician and they encouraged her to try to be seen sooner. Aware that is the first opening dr hochrein has and will forward this note for his review.

## 2015-06-10 NOTE — Telephone Encounter (Signed)
Pt has 2 month fu appt to discuss CT-having swelling in feet, ankles and legs - pls advise

## 2015-06-11 ENCOUNTER — Telehealth: Payer: Self-pay | Admitting: Cardiology

## 2015-06-11 NOTE — Telephone Encounter (Signed)
Looks like I could see her at 7:30 on May the 15th.

## 2015-06-11 NOTE — Progress Notes (Signed)
Cardiology Office Note   Date:  06/14/2015   ID:  Amy Amy, DOB April 04, 1960, MRN [id number]  PCP:  Mechele Claude, MD  Cardiologist:   Rollene Rotunda, MD   Chief Complaint  Patient presents with  . Chest Pain      History of Present Illness: Amy Amy is a 55 y.o. female who presents for evaluation of chest pain and coronary calcification.  She has a strong family history of CAD.  She is limited in her activities by back pain.  To evaluate some GI complaints she had an abodminal CT.   She was found to have right coronary artery coronary calcification. She has had a negative dobutamine echocardiogram in Feb 2016.  I saw her after this because of chest pain.  I sent her for coronary CT angiography which demonstrated diffuse nonobstructive LAD stenosis with possible 50-69% mid LAD stenosis. 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    However, she has continued to have chest discomfort. She reported some severe resting chest discomfort recently. She gets discomfort occasionally when she is doing activities such as vacuuming. He describes midsternal discomfort. She has some pain in her back. It's typically mild but it has been severe in the past. She's not had any associated nausea vomiting or diaphoresis. She's not had any presyncope or syncope. There is some palpitations. Has occasional shortness of breath. She feels very fatigued. She's had some lower extremity swelling.   Past Medical History  Diagnosis Date  . Headache(784.0)   . Arthritis   . PONV (postoperative nausea and vomiting)   . Lymphocytic colitis   . Depression   . IBS (irritable bowel syndrome)   . Chronic kidney disease   . Kidney stone   . DJD (degenerative joint disease)   . DJD (degenerative joint disease) of cervical spine   . DJD (degenerative joint disease), lumbar   . Hypertension     Past Surgical History  Procedure Laterality Date  . Lumbar disc surgery      x 4- lower back with  rods and screws  . Tubal ligation    . Lithotrpsy    . Cervical fusion      x 2  . Colonoscopy  02/11/2003     Current Outpatient Prescriptions  Medication Sig Dispense Refill  . ADDERALL XR 20 MG 24 hr capsule Take 20 mg by mouth daily.     Marland Kitchen ALPRAZolam (XANAX) 1 MG tablet Take 1 mg by mouth 4 (four) times daily as needed. ANXIETY     . aspirin 81 MG EC tablet TAKE 1 BY MOUTH DAILY 90 tablet 0  . Cholecalciferol (VITAMIN D-3) 1000 units CAPS Take 1,000 Units by mouth daily.     . CVS OMEGA-3 KRILL OIL 300 MG CAPS Take 300 mg by mouth daily.     Marland Kitchen gabapentin (NEURONTIN) 400 MG capsule Take 400 mg by mouth 3 (three) times daily.     . Glucosamine-Chondroitin (GLUCOSAMINE CHONDR COMPLEX PO) Take 1 tablet by mouth 2 (two) times daily.     Marland Kitchen lactulose (CHRONULAC) 10 GM/15ML solution Take 30 mLs by mouth 2 (two) times daily as needed for mild constipation.     . methocarbamol (ROBAXIN) 750 MG tablet Take 750 mg by mouth every 6 (six) hours as needed for muscle spasms.    . Multiple Vitamin (MULTIVITAMIN) tablet Take 1 tablet by mouth daily.    . naproxen (NAPROSYN) 500 MG tablet Take 500 mg by mouth  at bedtime.     Marland Kitchen oxycodone (ROXICODONE) 30 MG immediate release tablet Take 30 mg by mouth every 4 (four) hours as needed (Dr Thyra Breed). PAIN    . pantoprazole (PROTONIX) 40 MG tablet TAKE 1 BY MOUTH TWICE DAILY BEFORE A MEAL 180 tablet 0  . sennosides-docusate sodium (SENOKOT-S) 8.6-50 MG tablet Take 1 tablet by mouth daily.    Marland Kitchen venlafaxine (EFFEXOR) 75 MG tablet TAKE 1 TABLET EVERY 8 HOURS  2  . acetaminophen (TYLENOL) 500 MG tablet Take 1,000 mg by mouth 3 (three) times daily as needed for headache.    . Artificial Tear Ointment (DRY EYES OP) Place 1 drop into both eyes daily as needed (for dry eyes).    . Aspirin-Acetaminophen-Caffeine (GOODY HEADACHE PO) Take 1 packet by mouth daily as needed (for headache).    Marland Kitchen atorvastatin (LIPITOR) 40 MG tablet Take 1 tablet (40 mg total) by mouth  daily. 30 tablet 11  . lisinopril (PRINIVIL,ZESTRIL) 20 MG tablet Take 1 tablet (20 mg total) by mouth daily. 90 tablet 0  . metoprolol succinate (TOPROL XL) 25 MG 24 hr tablet Take 1 tablet (25 mg total) by mouth daily. 30 tablet 11  . nitroGLYCERIN (NITROSTAT) 0.4 MG SL tablet Place 1 tablet (0.4 mg total) under the tongue every 5 (five) minutes as needed for chest pain. 25 tablet 3   No current facility-administered medications for this visit.    Allergies:   Morphine and related    ROS:  Please see the history of present illness.   Otherwise, review of systems are positive for none.   All other systems are reviewed and negative.    PHYSICAL EXAM: VS:  BP 154/92 mmHg  Pulse 73  Ht 5\' 1"  (1.549 m)  Wt 160 lb (72.576 kg)  BMI 30.25 kg/m2 , BMI Body mass index is 30.25 kg/(m^2). GENERAL:  Well appearing HEENT:  Pupils equal round and reactive, fundi not visualized, oral mucosa unremarkable NECK:  No jugular venous distention, waveform within normal limits, carotid upstroke brisk and symmetric, no bruits, no thyromegaly LYMPHATICS:  No cervical, inguinal adenopathy LUNGS:  Clear to auscultation bilaterally BACK:  No CVA tenderness CHEST:  Unremarkable HEART:  PMI not displaced or sustained,S1 and S2 within normal limits, no S3, no S4, no clicks, no rubs, no murmurs ABD:  Flat, positive bowel sounds normal in frequency in pitch, no bruits, no rebound, no guarding, no midline pulsatile mass, no hepatomegaly, no splenomegaly EXT:  2 plus pulses throughout, no edema, no cyanosis no clubbing SKIN:  No rashes no nodules NEURO:  Cranial nerves II through XII grossly intact, motor grossly intact throughout PSYCH:  Cognitively intact, oriented to person place and time    EKG:  EKG is not ordered today.    Recent Labs: 03/12/2015: ALT 15 06/12/2015: BUN 8; Creat 0.83; Hemoglobin 12.1; Platelets 306; Potassium 5.4*; Sodium 140; TSH 1.50    Lipid Panel    Component Value Date/Time    CHOL 235* 02/25/2014 1121   TRIG 89 02/25/2014 1121   TRIG 108 05/21/2012 1211   HDL 59 02/25/2014 1121   HDL 57 05/21/2012 1211   CHOLHDL 3.5 05/21/2012 1211   VLDL 22 05/21/2012 1211   LDLCALC 119* 05/21/2012 1211      Wt Readings from Last 3 Encounters:  06/12/15 160 lb (72.576 kg)  04/23/15 155 lb 1.6 oz (70.353 kg)  03/12/15 150 lb 2 oz (68.096 kg)      Other studies Reviewed: Additional studies/  records that were reviewed today include: CT coronary angiogram Review of the above records demonstrates:  Please see elsewhere in the note.     ASSESSMENT AND PLAN:  CHEST PAIN/CAD:    Given the ongoing chest pain and documented CAD cardiac cath is indicated.  The patient understands that risks included but are not limited to stroke (1 in 1000), death (1 in 1000), kidney failure [usually temporary] (1 in 500), bleeding (1 in 200), allergic reaction [possibly serious] (1 in 200).  The patient understands and agrees to proceed.   I will also start Toprol XL 25 mg daily.  She will be given SLNTG as well.    HTN:    The blood pressure is at target. No change in medications is indicated. We will continue with therapeutic lifestyle changes (TLC).  DYSLIPIDEMIA:  Her LDL is mildly elevated.  I will start Lipitor.  We can repeat a lipid profile in 10 weeks.      Current medicines are reviewed at length with the patient today.  The patient does not have concerns regarding medircines.  The following changes have been made:  As above.   Labs/ tests ordered today include:     Orders Placed This Encounter  Procedures  . CBC  . TSH  . INR/PT  . APTT  . Basic Metabolic Panel (BMET)  . LEFT HEART CATHETERIZATION WITH CORONARY ANGIOGRAM     Disposition:   FU with me after the cath.      Signed, Rollene RotundaJames Kaitrin Seybold, MD  06/14/2015 9:20 AM    [facility]

## 2015-06-11 NOTE — Telephone Encounter (Signed)
Pt added for opening tomorrow. Called her and confirmed appt time and info w/ her, she voiced understanding and thanks.

## 2015-06-11 NOTE — Telephone Encounter (Signed)
Pt would like a call back please for an appointment in May with Hochrien.

## 2015-06-11 NOTE — Telephone Encounter (Signed)
Message send to billie to be schedule

## 2015-06-12 ENCOUNTER — Ambulatory Visit (INDEPENDENT_AMBULATORY_CARE_PROVIDER_SITE_OTHER): Payer: BLUE CROSS/BLUE SHIELD | Admitting: Cardiology

## 2015-06-12 ENCOUNTER — Encounter: Payer: Self-pay | Admitting: Cardiovascular Disease

## 2015-06-12 ENCOUNTER — Other Ambulatory Visit: Payer: Self-pay | Admitting: Student

## 2015-06-12 ENCOUNTER — Other Ambulatory Visit: Payer: Self-pay | Admitting: *Deleted

## 2015-06-12 VITALS — BP 154/92 | HR 73 | Ht 61.0 in | Wt 160.0 lb

## 2015-06-12 DIAGNOSIS — Z79899 Other long term (current) drug therapy: Secondary | ICD-10-CM

## 2015-06-12 DIAGNOSIS — I200 Unstable angina: Secondary | ICD-10-CM

## 2015-06-12 DIAGNOSIS — Z01818 Encounter for other preprocedural examination: Secondary | ICD-10-CM

## 2015-06-12 DIAGNOSIS — R5383 Other fatigue: Secondary | ICD-10-CM | POA: Diagnosis not present

## 2015-06-12 DIAGNOSIS — D689 Coagulation defect, unspecified: Secondary | ICD-10-CM | POA: Diagnosis not present

## 2015-06-12 MED ORDER — LISINOPRIL 20 MG PO TABS: 20.0000 mg | ORAL_TABLET | Freq: Every day | ORAL | Status: DC

## 2015-06-12 MED ORDER — METOPROLOL SUCCINATE ER 25 MG PO TB24: 25.0000 mg | ORAL_TABLET | Freq: Every day | ORAL | Status: DC

## 2015-06-12 MED ORDER — NITROGLYCERIN 0.4 MG SL SUBL: 0.4000 mg | SUBLINGUAL_TABLET | SUBLINGUAL | Status: DC | PRN

## 2015-06-12 MED ORDER — ATORVASTATIN CALCIUM 40 MG PO TABS: 40.0000 mg | ORAL_TABLET | Freq: Every day | ORAL | Status: DC

## 2015-06-12 NOTE — Telephone Encounter (Signed)
Patient called and stated that she went to the pharmacy to pick up the new rx's and [facility].

## 2015-06-12 NOTE — Patient Instructions (Signed)
Medication Instructions:  START Metoprolol 25 mg daily and Atorvastatin 40 mg daily, Take Nitroglycerin as needed for chest pain not exceeding 3 tablets  Labwork: Pre Op Labs  Testing/Procedures: Your physician has requested that you have a Left Heart Cardiac Catheterization. Cardiac catheterization is used to diagnose and/or treat various heart conditions. Doctors may recommend this procedure for a number of different reasons. The most common reason is to evaluate chest pain. Chest pain can be a symptom of coronary artery disease (CAD), and cardiac catheterization can show whether plaque is narrowing or blocking your heart's arteries. This procedure is also used to evaluate the valves, as well as measure the blood flow and oxygen levels in different parts of your heart. For further information please visit [email or web address]. Please follow instruction sheet, as given.  Follow-Up: After Cath  Any Other Special Instructions Will Be Listed Below (If Applicable).   If you need a refill on your cardiac medications before your next appointment, please call your pharmacy.

## 2015-06-12 NOTE — Telephone Encounter (Signed)
Pt was in today and was to start Metoprolol, and Atorvastatin, also to get rx fro Nitro-she uses mail order for 90 days but due to her needing to start these today [facility] then get the mail order 90 days

## 2015-06-12 NOTE — Telephone Encounter (Signed)
REFILL 

## 2015-06-13 LAB — PROTIME-INR
INR: 0.94 (ref <1.50)
PROTHROMBIN TIME: 12.7 s (ref 11.6–15.2)

## 2015-06-13 LAB — CBC
HEMATOCRIT: 37.7 % (ref 35.0–45.0)
HEMOGLOBIN: 12.1 g/dL (ref 11.7–15.5)
MCH: 30.9 pg (ref 27.0–33.0)
MCHC: 32.1 g/dL (ref 32.0–36.0)
MCV: 96.2 fL (ref 80.0–100.0)
MPV: 9.5 fL (ref 7.5–12.5)
Platelets: 306 10*3/uL (ref 140–400)
RBC: 3.92 MIL/uL (ref 3.80–5.10)
RDW: 13.9 % (ref 11.0–15.0)
WBC: 5.6 10*3/uL (ref 3.8–10.8)

## 2015-06-13 LAB — BASIC METABOLIC PANEL
BUN: 8 mg/dL (ref 7–25)
CO2: 28 mmol/L (ref 20–31)
Calcium: 9.1 mg/dL (ref 8.6–10.4)
Chloride: 104 mmol/L (ref 98–110)
Creat: 0.83 mg/dL (ref 0.50–1.05)
Glucose, Bld: 91 mg/dL (ref 65–99)
Potassium: 5.4 mmol/L — ABNORMAL HIGH (ref 3.5–5.3)
SODIUM: 140 mmol/L (ref 135–146)

## 2015-06-13 LAB — TSH: TSH: 1.5 mIU/L

## 2015-06-13 LAB — APTT: APTT: 29 s (ref 24–37)

## 2015-06-14 ENCOUNTER — Encounter: Payer: Self-pay | Admitting: Cardiology

## 2015-06-15 ENCOUNTER — Ambulatory Visit (HOSPITAL_COMMUNITY)
Admission: RE | Admit: 2015-06-15 | Discharge: 2015-06-15 | Disposition: A | Discharge status: Home / Self Care | Payer: BLUE CROSS/BLUE SHIELD | Source: Ambulatory Visit | Attending: Cardiovascular Disease | Admitting: Cardiovascular Disease

## 2015-06-15 ENCOUNTER — Encounter (HOSPITAL_COMMUNITY)
Admission: RE | Disposition: A | Discharge status: Home / Self Care | Payer: Self-pay | Source: Ambulatory Visit | Attending: Cardiovascular Disease

## 2015-06-15 DIAGNOSIS — N189 Chronic kidney disease, unspecified: Secondary | ICD-10-CM | POA: Diagnosis not present

## 2015-06-15 DIAGNOSIS — G894 Chronic pain syndrome: Secondary | ICD-10-CM | POA: Insufficient documentation

## 2015-06-15 DIAGNOSIS — F329 Major depressive disorder, single episode, unspecified: Secondary | ICD-10-CM | POA: Insufficient documentation

## 2015-06-15 DIAGNOSIS — I2584 Coronary atherosclerosis due to calcified coronary lesion: Secondary | ICD-10-CM | POA: Insufficient documentation

## 2015-06-15 DIAGNOSIS — M549 Dorsalgia, unspecified: Secondary | ICD-10-CM | POA: Diagnosis not present

## 2015-06-15 DIAGNOSIS — I129 Hypertensive chronic kidney disease with stage 1 through stage 4 chronic kidney disease, or unspecified chronic kidney disease: Secondary | ICD-10-CM | POA: Diagnosis not present

## 2015-06-15 DIAGNOSIS — I25119 Atherosclerotic heart disease of native coronary artery with unspecified angina pectoris: Secondary | ICD-10-CM | POA: Diagnosis not present

## 2015-06-15 DIAGNOSIS — M1990 Unspecified osteoarthritis, unspecified site: Secondary | ICD-10-CM | POA: Insufficient documentation

## 2015-06-15 DIAGNOSIS — K589 Irritable bowel syndrome without diarrhea: Secondary | ICD-10-CM | POA: Insufficient documentation

## 2015-06-15 DIAGNOSIS — Z7982 Long term (current) use of aspirin: Secondary | ICD-10-CM | POA: Diagnosis not present

## 2015-06-15 DIAGNOSIS — Z87442 Personal history of urinary calculi: Secondary | ICD-10-CM | POA: Insufficient documentation

## 2015-06-15 DIAGNOSIS — E785 Hyperlipidemia, unspecified: Secondary | ICD-10-CM | POA: Insufficient documentation

## 2015-06-15 DIAGNOSIS — M479 Spondylosis, unspecified: Secondary | ICD-10-CM | POA: Diagnosis not present

## 2015-06-15 HISTORY — PX: CARDIAC CATHETERIZATION: SHX172

## 2015-06-15 LAB — POTASSIUM: POTASSIUM: 3.8 mmol/L (ref 3.5–5.1)

## 2015-06-15 SURGERY — LEFT HEART CATH AND CORONARY ANGIOGRAPHY
Anesthesia: LOCAL

## 2015-06-15 MED ORDER — LIDOCAINE HCL (PF) 1 % IJ SOLN
INTRAMUSCULAR | Status: DC | PRN
  Administered 2015-06-15 (×2): 3 mL via SUBCUTANEOUS

## 2015-06-15 MED ORDER — IOPAMIDOL (ISOVUE-370) INJECTION 76%
INTRAVENOUS | Status: AC
  Filled 2015-06-15: qty 100

## 2015-06-15 MED ORDER — SODIUM CHLORIDE 0.9 % IV SOLN: INTRAVENOUS | Status: AC

## 2015-06-15 MED ORDER — SODIUM CHLORIDE 0.9 % IV SOLN: 250.0000 mL | INTRAVENOUS | Status: DC | PRN

## 2015-06-15 MED ORDER — IOPAMIDOL (ISOVUE-370) INJECTION 76%
INTRAVENOUS | Status: DC | PRN
  Administered 2015-06-15: 70 mL via INTRA_ARTERIAL

## 2015-06-15 MED ORDER — HEPARIN SODIUM (PORCINE) 1000 UNIT/ML IJ SOLN
INTRAMUSCULAR | Status: AC
  Filled 2015-06-15: qty 1

## 2015-06-15 MED ORDER — MIDAZOLAM HCL 2 MG/2ML IJ SOLN
INTRAMUSCULAR | Status: DC | PRN
  Administered 2015-06-15: 2 mg via INTRAVENOUS

## 2015-06-15 MED ORDER — HEPARIN (PORCINE) IN NACL 2-0.9 UNIT/ML-% IJ SOLN
INTRAMUSCULAR | Status: AC
  Filled 2015-06-15: qty 1000

## 2015-06-15 MED ORDER — SODIUM CHLORIDE 0.9 % WEIGHT BASED INFUSION: 1.0000 mL/kg/h | INTRAVENOUS | Status: DC

## 2015-06-15 MED ORDER — SODIUM CHLORIDE 0.9% FLUSH: 3.0000 mL | INTRAVENOUS | Status: DC | PRN

## 2015-06-15 MED ORDER — MIDAZOLAM HCL 2 MG/2ML IJ SOLN
INTRAMUSCULAR | Status: AC
  Filled 2015-06-15: qty 2

## 2015-06-15 MED ORDER — SODIUM CHLORIDE 0.9% FLUSH: 3.0000 mL | Freq: Two times a day (BID) | INTRAVENOUS | Status: DC

## 2015-06-15 MED ORDER — FENTANYL CITRATE (PF) 100 MCG/2ML IJ SOLN
INTRAMUSCULAR | Status: DC | PRN
Start: 2015-06-15 — End: 2015-06-15
  Administered 2015-06-15 (×3): 50 ug via INTRAVENOUS

## 2015-06-15 MED ORDER — FENTANYL CITRATE (PF) 100 MCG/2ML IJ SOLN
INTRAMUSCULAR | Status: AC
  Filled 2015-06-15: qty 2

## 2015-06-15 MED ORDER — LIDOCAINE HCL (PF) 1 % IJ SOLN
INTRAMUSCULAR | Status: AC
  Filled 2015-06-15: qty 30

## 2015-06-15 MED ORDER — SODIUM CHLORIDE 0.9 % WEIGHT BASED INFUSION
3.0000 mL/kg/h | INTRAVENOUS | Status: AC
  Administered 2015-06-15: 3 mL/kg/h via INTRAVENOUS

## 2015-06-15 MED ORDER — VERAPAMIL HCL 2.5 MG/ML IV SOLN
INTRAVENOUS | Status: DC | PRN
  Administered 2015-06-15: 10 mL via INTRA_ARTERIAL

## 2015-06-15 MED ORDER — VERAPAMIL HCL 2.5 MG/ML IV SOLN
INTRAVENOUS | Status: AC
  Filled 2015-06-15: qty 2

## 2015-06-15 MED ORDER — ASPIRIN 81 MG PO CHEW: 81.0000 mg | CHEWABLE_TABLET | ORAL | Status: DC

## 2015-06-15 MED ORDER — HEPARIN (PORCINE) IN NACL 2-0.9 UNIT/ML-% IJ SOLN
INTRAMUSCULAR | Status: DC | PRN
  Administered 2015-06-15: 1000 mL

## 2015-06-15 MED ORDER — HEPARIN SODIUM (PORCINE) 1000 UNIT/ML IJ SOLN
INTRAMUSCULAR | Status: DC | PRN
  Administered 2015-06-15: 3500 [IU] via INTRAVENOUS

## 2015-06-15 SURGICAL SUPPLY — 11 items
CATH INFINITI 5 FR JL3.5 (CATHETERS) ×2 IMPLANT
CATH INFINITI 5FR ANG PIGTAIL (CATHETERS) ×1 IMPLANT
CATH INFINITI JR4 5F (CATHETERS) ×1 IMPLANT
DEVICE RAD COMP TR BAND LRG (VASCULAR PRODUCTS) ×1 IMPLANT
GLIDESHEATH SLEND SS 6F .021 (SHEATH) ×1 IMPLANT
KIT HEART LEFT (KITS) ×2 IMPLANT
PACK CARDIAC CATHETERIZATION (CUSTOM PROCEDURE TRAY) ×2 IMPLANT
SYR MEDRAD MARK V 150ML (SYRINGE) ×2 IMPLANT
TRANSDUCER W/STOPCOCK (MISCELLANEOUS) ×2 IMPLANT
TUBING CIL FLEX 10 FLL-RA (TUBING) ×2 IMPLANT
WIRE SAFE-T 1.5MM-J .035X260CM (WIRE) ×1 IMPLANT

## 2015-06-15 NOTE — Discharge Instructions (Signed)
Radial Site Care °Refer to this sheet in the next few weeks. These instructions provide you with information about caring for yourself after your procedure. Your health care provider may also give you more specific instructions. Your treatment has been planned according to current medical practices, but problems sometimes occur. Call your health care provider if you have any problems or questions after your procedure. °WHAT TO EXPECT AFTER THE PROCEDURE °After your procedure, it is typical to have the following: °· Bruising at the radial site that usually fades within 1-2 weeks. °· Blood collecting in the tissue (hematoma) that may be painful to the touch. It should usually decrease in size and tenderness within 1-2 weeks. °HOME CARE INSTRUCTIONS °· Take medicines only as directed by your health care provider. °· You may shower 24-48 hours after the procedure or as directed by your health care provider. Remove the bandage (dressing) and gently wash the site with plain soap and water. Pat the area dry with a clean towel. Do not rub the site, because this may cause bleeding. °· Do not take baths, swim, or use a hot tub until your health care provider approves. °· Check your insertion site every day for redness, swelling, or drainage. °· Do not apply powder or lotion to the site. °· Do not flex or bend the affected arm for 24 hours or as directed by your health care provider. °· Do not push or pull heavy objects with the affected arm for 24 hours or as directed by your health care provider. °· Do not lift over 10 lb (4.5 kg) for 5 days after your procedure or as directed by your health care provider. °· Ask your health care provider when it is okay to: °¨ Return to work or school. °¨ Resume usual physical activities or sports. °¨ Resume sexual activity. °· Do not drive home if you are discharged the same day as the procedure. Have someone else drive you. °· You may drive 24 hours after the procedure unless otherwise  instructed by your health care provider. °· Do not operate machinery or power tools for 24 hours after the procedure. °· If your procedure was done as an outpatient procedure, which means that you went home the same day as your procedure, a responsible adult should be with you for the first 24 hours after you arrive home. °· Keep all follow-up visits as directed by your health care provider. This is important. °SEEK MEDICAL CARE IF: °· You have a fever. °· You have chills. °· You have increased bleeding from the radial site. Hold pressure on the site and call 911. °SEEK IMMEDIATE MEDICAL CARE IF: °· You have unusual pain at the radial site. °· You have redness, warmth, or swelling at the radial site. °· You have drainage (other than a small amount of blood on the dressing) from the radial site. °· The radial site is bleeding, and the bleeding does not stop after 30 minutes of holding steady pressure on the site. °· Your arm or hand becomes pale, cool, tingly, or numb. °  °This information is not intended to replace advice given to you by your health care provider. Make sure you discuss any questions you have with your health care provider. °  °Document Released: 02/26/2010 Document Revised: 02/14/2014 Document Reviewed: 08/12/2013 °Elsevier Interactive Patient Education ©2016 [employer] ° °

## 2015-06-15 NOTE — H&P (View-Only) (Signed)
Cardiology Office Note   Date:  06/14/2015   ID:  Amy Amy, DOB April 04, 1960, MRN [id number]  PCP:  Mechele Claude, MD  Cardiologist:   Rollene Rotunda, MD   Chief Complaint  Patient presents with  . Chest Pain      History of Present Illness: Amy Amy is a 55 y.o. female who presents for evaluation of chest pain and coronary calcification.  She has a strong family history of CAD.  She is limited in her activities by back pain.  To evaluate some GI complaints she had an abodminal CT.   She was found to have right coronary artery coronary calcification. She has had a negative dobutamine echocardiogram in Feb 2016.  I saw her after this because of chest pain.  I sent her for coronary CT angiography which demonstrated diffuse nonobstructive LAD stenosis with possible 50-69% mid LAD stenosis. 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    However, she has continued to have chest discomfort. She reported some severe resting chest discomfort recently. She gets discomfort occasionally when she is doing activities such as vacuuming. He describes midsternal discomfort. She has some pain in her back. It's typically mild but it has been severe in the past. She's not had any associated nausea vomiting or diaphoresis. She's not had any presyncope or syncope. There is some palpitations. Has occasional shortness of breath. She feels very fatigued. She's had some lower extremity swelling.   Past Medical History  Diagnosis Date  . Headache(784.0)   . Arthritis   . PONV (postoperative nausea and vomiting)   . Lymphocytic colitis   . Depression   . IBS (irritable bowel syndrome)   . Chronic kidney disease   . Kidney stone   . DJD (degenerative joint disease)   . DJD (degenerative joint disease) of cervical spine   . DJD (degenerative joint disease), lumbar   . Hypertension     Past Surgical History  Procedure Laterality Date  . Lumbar disc surgery      x 4- lower back with  rods and screws  . Tubal ligation    . Lithotrpsy    . Cervical fusion      x 2  . Colonoscopy  02/11/2003     Current Outpatient Prescriptions  Medication Sig Dispense Refill  . ADDERALL XR 20 MG 24 hr capsule Take 20 mg by mouth daily.     Marland Kitchen ALPRAZolam (XANAX) 1 MG tablet Take 1 mg by mouth 4 (four) times daily as needed. ANXIETY     . aspirin 81 MG EC tablet TAKE 1 BY MOUTH DAILY 90 tablet 0  . Cholecalciferol (VITAMIN D-3) 1000 units CAPS Take 1,000 Units by mouth daily.     . [facility] OMEGA-3 KRILL OIL 300 MG CAPS Take 300 mg by mouth daily.     Marland Kitchen gabapentin (NEURONTIN) 400 MG capsule Take 400 mg by mouth 3 (three) times daily.     . Glucosamine-Chondroitin (GLUCOSAMINE CHONDR COMPLEX PO) Take 1 tablet by mouth 2 (two) times daily.     Marland Kitchen lactulose (CHRONULAC) 10 GM/15ML solution Take 30 mLs by mouth 2 (two) times daily as needed for mild constipation.     . methocarbamol (ROBAXIN) 750 MG tablet Take 750 mg by mouth every 6 (six) hours as needed for muscle spasms.    . Multiple Vitamin (MULTIVITAMIN) tablet Take 1 tablet by mouth daily.    . naproxen (NAPROSYN) 500 MG tablet Take 500 mg by mouth  at bedtime.     Marland Kitchen oxycodone (ROXICODONE) 30 MG immediate release tablet Take 30 mg by mouth every 4 (four) hours as needed (Dr Thyra Breed). PAIN    . pantoprazole (PROTONIX) 40 MG tablet TAKE 1 BY MOUTH TWICE DAILY BEFORE A MEAL 180 tablet 0  . sennosides-docusate sodium (SENOKOT-S) 8.6-50 MG tablet Take 1 tablet by mouth daily.    Marland Kitchen venlafaxine (EFFEXOR) 75 MG tablet TAKE 1 TABLET EVERY 8 HOURS  2  . acetaminophen (TYLENOL) 500 MG tablet Take 1,000 mg by mouth 3 (three) times daily as needed for headache.    . Artificial Tear Ointment (DRY EYES OP) Place 1 drop into both eyes daily as needed (for dry eyes).    . Aspirin-Acetaminophen-Caffeine (GOODY HEADACHE PO) Take 1 packet by mouth daily as needed (for headache).    Marland Kitchen atorvastatin (LIPITOR) 40 MG tablet Take 1 tablet (40 mg total) by mouth  daily. 30 tablet 11  . lisinopril (PRINIVIL,ZESTRIL) 20 MG tablet Take 1 tablet (20 mg total) by mouth daily. 90 tablet 0  . metoprolol succinate (TOPROL XL) 25 MG 24 hr tablet Take 1 tablet (25 mg total) by mouth daily. 30 tablet 11  . nitroGLYCERIN (NITROSTAT) 0.4 MG SL tablet Place 1 tablet (0.4 mg total) under the tongue every 5 (five) minutes as needed for chest pain. 25 tablet 3   No current facility-administered medications for this visit.    Allergies:   Morphine and related    ROS:  Please see the history of present illness.   Otherwise, review of systems are positive for none.   All other systems are reviewed and negative.    PHYSICAL EXAM: VS:  BP 154/92 mmHg  Pulse 73  Ht 5\' 1"  (1.549 m)  Wt 160 lb (72.576 kg)  BMI 30.25 kg/m2 , BMI Body mass index is 30.25 kg/(m^2). GENERAL:  Well appearing HEENT:  Pupils equal round and reactive, fundi not visualized, oral mucosa unremarkable NECK:  No jugular venous distention, waveform within normal limits, carotid upstroke brisk and symmetric, no bruits, no thyromegaly LYMPHATICS:  No cervical, inguinal adenopathy LUNGS:  Clear to auscultation bilaterally BACK:  No CVA tenderness CHEST:  Unremarkable HEART:  PMI not displaced or sustained,S1 and S2 within normal limits, no S3, no S4, no clicks, no rubs, no murmurs ABD:  Flat, positive bowel sounds normal in frequency in pitch, no bruits, no rebound, no guarding, no midline pulsatile mass, no hepatomegaly, no splenomegaly EXT:  2 plus pulses throughout, no edema, no cyanosis no clubbing SKIN:  No rashes no nodules NEURO:  Cranial nerves II through XII grossly intact, motor grossly intact throughout PSYCH:  Cognitively intact, oriented to person place and time    EKG:  EKG is not ordered today.    Recent Labs: 03/12/2015: ALT 15 06/12/2015: BUN 8; Creat 0.83; Hemoglobin 12.1; Platelets 306; Potassium 5.4*; Sodium 140; TSH 1.50    Lipid Panel    Component Value Date/Time    CHOL 235* 02/25/2014 1121   TRIG 89 02/25/2014 1121   TRIG 108 05/21/2012 1211   HDL 59 02/25/2014 1121   HDL 57 05/21/2012 1211   CHOLHDL 3.5 05/21/2012 1211   VLDL 22 05/21/2012 1211   LDLCALC 119* 05/21/2012 1211      Wt Readings from Last 3 Encounters:  06/12/15 160 lb (72.576 kg)  04/23/15 155 lb 1.6 oz (70.353 kg)  03/12/15 150 lb 2 oz (68.096 kg)      Other studies Reviewed: Additional studies/  records that were reviewed today include: CT coronary angiogram Review of the above records demonstrates:  Please see elsewhere in the note.     ASSESSMENT AND PLAN:  CHEST PAIN/CAD:    Given the ongoing chest pain and documented CAD cardiac cath is indicated.  The patient understands that risks included but are not limited to stroke (1 in 1000), death (1 in 1000), kidney failure [usually temporary] (1 in 500), bleeding (1 in 200), allergic reaction [possibly serious] (1 in 200).  The patient understands and agrees to proceed.   I will also start Toprol XL 25 mg daily.  She will be given SLNTG as well.    HTN:    The blood pressure is at target. No change in medications is indicated. We will continue with therapeutic lifestyle changes (TLC).  DYSLIPIDEMIA:  Her LDL is mildly elevated.  I will start Lipitor.  We can repeat a lipid profile in 10 weeks.      Current medicines are reviewed at length with the patient today.  The patient does not have concerns regarding medircines.  The following changes have been made:  As above.   Labs/ tests ordered today include:     Orders Placed This Encounter  Procedures  . CBC  . TSH  . INR/PT  . APTT  . Basic Metabolic Panel (BMET)  . LEFT HEART CATHETERIZATION WITH CORONARY ANGIOGRAM     Disposition:   FU with me after the cath.      Signed, Rollene RotundaJames Jazon Jipson, MD  06/14/2015 9:20 AM    [facility]

## 2015-06-15 NOTE — Interval H&P Note (Signed)
History and Physical Interval Note:  06/15/2015 11:02 AM  Amy Merritt  has presented today for cardiac cath with the diagnosis of unstable angina  The various methods of treatment have been discussed with the patient and family. After consideration of risks, benefits and other options for treatment, the patient has consented to  Procedure(s): Left Heart Cath and Coronary Angiography (N/A) as a surgical intervention .  The patient's history has been reviewed, patient examined, no change in status, stable for surgery.  I have reviewed the patient's chart and labs.  Questions were answered to the patient's satisfaction.     MCALHANY,CHRISTOPHER

## 2015-06-16 ENCOUNTER — Encounter (HOSPITAL_COMMUNITY): Payer: Self-pay | Admitting: Cardiovascular Disease

## 2015-06-22 ENCOUNTER — Telehealth: Payer: Self-pay | Admitting: *Deleted

## 2015-06-22 DIAGNOSIS — Z79899 Other long term (current) drug therapy: Secondary | ICD-10-CM

## 2015-06-22 NOTE — Telephone Encounter (Signed)
-----   Message from Rollene RotundaJames Hochrein, MD sent at 06/21/2015 11:29 AM EDT ----- Potassium was mildly increased.  Please have this repeated.

## 2015-06-28 NOTE — Progress Notes (Signed)
Cardiology Office Note   Date:  06/29/2015   ID:  Amy SchlatterRenee V Lewison, DOB Aug 19, 1960, MRN [id number]  PCP:  Mechele ClaudeSTACKS,WARREN, MD  Cardiologist:   Rollene RotundaJames Jezlyn Westerfield, MD   Chief Complaint  Patient presents with  . Follow-up    SOB;only when having back pain; EDEMA; feet and legs.      History of Present Illness: Amy SchlatterRenee V Loser is a 55 y.o. female who presents for evaluation of chest pain and coronary calcification.  She has a strong family history of CAD.  She is limited in her activities by back pain.  To evaluate some GI complaints she had an abodminal CT.   She was found to have right coronary artery coronary calcification. She has had a negative dobutamine echocardiogram in Feb 2016.  I saw her after this because of chest pain.  I sent her for coronary CT angiography which demonstrated diffuse nonobstructive LAD stenosis with possible 50-69% mid LAD stenosis. 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  However, cath demonstrated only non obstructive disease.  The LAD 30% stenosis ostial left main 20% stenosis RCA 25% stenosis.  Since I last saw her she has done well.  She has some mild edema but not other symptoms.  I did ask her to stop the Goody powder while she is taking ASA.  She has back pain.   Past Medical History  Diagnosis Date  . Headache(784.0)   . Arthritis   . PONV (postoperative nausea and vomiting)   . Lymphocytic colitis   . Depression   . IBS (irritable bowel syndrome)   . Chronic kidney disease   . Kidney stone   . DJD (degenerative joint disease)   . DJD (degenerative joint disease) of cervical spine   . DJD (degenerative joint disease), lumbar   . Hypertension     Past Surgical History  Procedure Laterality Date  . Lumbar disc surgery      x 4- lower back with rods and screws  . Tubal ligation    . Lithotrpsy    . Cervical fusion      x 2  . Colonoscopy  02/11/2003  . Cardiac catheterization N/A 06/15/2015    Procedure: Left Heart Cath and Coronary  Angiography;  Surgeon: Kathleene Hazelhristopher D McAlhany, MD;  [facility];  Service: Cardiovascular;  Laterality: N/A;     Current Outpatient Prescriptions  Medication Sig Dispense Refill  . acetaminophen (TYLENOL) 500 MG tablet Take 1,000 mg by mouth 3 (three) times daily as needed for headache.    . ADDERALL XR 20 MG 24 hr capsule Take 20 mg by mouth daily.     Marland Kitchen. ALPRAZolam (XANAX) 1 MG tablet Take 1 mg by mouth 4 (four) times daily as needed. ANXIETY     . Artificial Tear Ointment (DRY EYES OP) Place 1 drop into both eyes daily as needed (for dry eyes).    Marland Kitchen. aspirin 81 MG EC tablet TAKE 1 BY MOUTH DAILY 90 tablet 0  . Aspirin-Acetaminophen-Caffeine (GOODY HEADACHE PO) Take 1 packet by mouth daily as needed (for headache).    Marland Kitchen. atorvastatin (LIPITOR) 40 MG tablet Take 1 tablet (40 mg total) by mouth daily. 30 tablet 11  . Cholecalciferol (VITAMIN D-3) 1000 units CAPS Take 1,000 Units by mouth daily.     . CVS OMEGA-3 KRILL OIL 300 MG CAPS Take 300 mg by mouth daily.     Marland Kitchen. gabapentin (NEURONTIN) 400 MG capsule Take 400 mg by mouth  3 (three) times daily.     . Glucosamine-Chondroitin (GLUCOSAMINE CHONDR COMPLEX PO) Take 1 tablet by mouth 2 (two) times daily.     Marland Kitchen lactulose (CHRONULAC) 10 GM/15ML solution Take 30 mLs by mouth 2 (two) times daily as needed for mild constipation.     Marland Kitchen lisinopril (PRINIVIL,ZESTRIL) 20 MG tablet Take 1 tablet (20 mg total) by mouth daily. 90 tablet 0  . methocarbamol (ROBAXIN) 750 MG tablet Take 750 mg by mouth every 6 (six) hours as needed for muscle spasms.    . metoprolol succinate (TOPROL XL) 25 MG 24 hr tablet Take 1 tablet (25 mg total) by mouth daily. 30 tablet 11  . Multiple Vitamin (MULTIVITAMIN) tablet Take 1 tablet by mouth daily.    . naproxen (NAPROSYN) 500 MG tablet Take 500 mg by mouth at bedtime.     . nitroGLYCERIN (NITROSTAT) 0.4 MG SL tablet Place 1 tablet (0.4 mg total) under the tongue every 5 (five) minutes as needed for chest pain.  25 tablet 3  . oxycodone (ROXICODONE) 30 MG immediate release tablet Take 30 mg by mouth every 4 (four) hours as needed (Dr Thyra Breed). PAIN    . pantoprazole (PROTONIX) 40 MG tablet TAKE 1 BY MOUTH TWICE DAILY BEFORE A MEAL 180 tablet 0  . sennosides-docusate sodium (SENOKOT-S) 8.6-50 MG tablet Take 1 tablet by mouth daily.    Marland Kitchen venlafaxine (EFFEXOR) 75 MG tablet TAKE 1 TABLET EVERY 8 HOURS  2   No current facility-administered medications for this visit.    Allergies:   Morphine and related    ROS:  Please see the history of present illness.   Otherwise, review of systems are positive for none.   All other systems are reviewed and negative.    PHYSICAL EXAM: VS:  BP 121/80 mmHg  Pulse 66  Ht  (1.549 m)  Wt 159 lb 3.2 oz (72.213 kg)  BMI 30.10 kg/m2 , BMI Body mass index is 30.1 kg/(m^2). GENERAL:  Well appearing LYMPHATICS:  No cervical, inguinal adenopathy LUNGS:  Clear to auscultation bilaterally BACK:  No CVA tenderness CHEST:  Unremarkable HEART:  PMI not displaced or sustained,S1 and S2 within normal limits, no S3, no S4, no clicks, no rubs, no murmurs ABD:  Flat, positive bowel sounds normal in frequency in pitch, no bruits, no rebound, no guarding, no midline pulsatile mass, no hepatomegaly, no splenomegaly EXT:  2 plus pulses throughout, no edema, no cyanosis no clubbing, right wrist OK.     EKG:  EKG is not ordered today.    Recent Labs: 03/12/2015: ALT 15 06/12/2015: BUN 8; Creat 0.83; Hemoglobin 12.1; Platelets 306; Sodium 140; TSH 1.50 06/15/2015: Potassium 3.8    Lipid Panel    Component Value Date/Time   CHOL 235* 02/25/2014 1121   TRIG 89 02/25/2014 1121   TRIG 108 05/21/2012 1211   HDL 59 02/25/2014 1121   HDL 57 05/21/2012 1211   CHOLHDL 3.5 05/21/2012 1211   VLDL 22 05/21/2012 1211   LDLCALC 119* 05/21/2012 1211      Wt Readings from Last 3 Encounters:  06/29/15 159 lb 3.2 oz (72.213 kg)  06/15/15 159 lb (72.122 kg)  06/12/15 160 lb  (72.576 kg)      Other studies Reviewed: Additional studies/ records that were reviewed today include: Cath Review of the above records demonstrates:  Please see elsewhere in the note.     ASSESSMENT AND PLAN:  CAD:    She needs risk reduction but no  other therapy.  She can stop her low dose beta blocker.   HTN:    The blood pressure is at target. No change in medications is indicated. We will continue with therapeutic lifestyle changes (TLC).  DYSLIPIDEMIA:  She can have this followed by Mechele Claude, MD.  She should get a lipid and liver enzymes in about 8 weeks.   HYPERKALEMIA:  Her potassium was mildly elevated at the last draw so she will have a BMET today.    Current medicines are reviewed at length with the patient today.  The patient does not have concerns regarding medircines.  The following changes have been made:  As above.   Labs/ tests ordered today include:     No orders of the defined types were placed in this encounter.     Disposition:   FU with me as needed.     Signed, Rollene Rotunda, MD  06/29/2015 10:01 AM    [facility]

## 2015-06-29 ENCOUNTER — Ambulatory Visit (INDEPENDENT_AMBULATORY_CARE_PROVIDER_SITE_OTHER): Payer: BLUE CROSS/BLUE SHIELD | Admitting: Cardiology

## 2015-06-29 ENCOUNTER — Encounter: Payer: Self-pay | Admitting: Cardiology

## 2015-06-29 VITALS — BP 121/80 | HR 66 | Ht 61.0 in | Wt 159.2 lb

## 2015-06-29 DIAGNOSIS — R0602 Shortness of breath: Secondary | ICD-10-CM

## 2015-06-29 NOTE — Patient Instructions (Signed)
Medication Instructions:  STOP Metoprolol  Labwork: BMP  Testing/Procedures: NONE  Follow-Up: As Needed  Any Other Special Instructions Will Be Listed Below (If Applicable).     If you need a refill on your cardiac medications before your next appointment, please call your pharmacy.

## 2015-06-30 LAB — BASIC METABOLIC PANEL
BUN: 9 mg/dL (ref 7–25)
CALCIUM: 9 mg/dL (ref 8.6–10.4)
CO2: 25 mmol/L (ref 20–31)
CREATININE: 0.9 mg/dL (ref 0.50–1.05)
Chloride: 104 mmol/L (ref 98–110)
Glucose, Bld: 80 mg/dL (ref 65–99)
Potassium: 4.4 mmol/L (ref 3.5–5.3)
Sodium: 140 mmol/L (ref 135–146)

## 2015-07-01 ENCOUNTER — Other Ambulatory Visit: Payer: Self-pay

## 2015-07-01 MED ORDER — ATORVASTATIN CALCIUM 40 MG PO TABS: 40.0000 mg | ORAL_TABLET | Freq: Every day | ORAL | Status: DC

## 2015-07-02 ENCOUNTER — Other Ambulatory Visit: Payer: Self-pay

## 2015-07-02 MED ORDER — ATORVASTATIN CALCIUM 40 MG PO TABS: 40.0000 mg | ORAL_TABLET | Freq: Every day | ORAL | Status: DC

## 2015-07-02 NOTE — Telephone Encounter (Signed)
Patient stated she would like her medication sent to [facility]

## 2015-07-16 ENCOUNTER — Ambulatory Visit: Payer: BLUE CROSS/BLUE SHIELD | Admitting: Cardiology

## 2015-07-17 ENCOUNTER — Ambulatory Visit: Payer: BLUE CROSS/BLUE SHIELD | Admitting: Cardiology

## 2015-08-05 ENCOUNTER — Telehealth: Payer: Self-pay | Admitting: Cardiology

## 2015-08-05 NOTE — Telephone Encounter (Signed)
Follow-up ° ° ° ° ° °The pt is returning the nurse call °

## 2015-08-05 NOTE — Telephone Encounter (Signed)
Amy LandsbergRenee is calling because her pain management Dr. Thyra BreedMark Phillips is telling her to stop taking the Atorvastatin 40mg  , because its causing a lot of joint pain and she is wanting to know what do Dr. Antoine PocheHochrein want her to do. Please Call    Thanks

## 2015-08-05 NOTE — Telephone Encounter (Signed)
Left msg for patient to call. 

## 2015-08-06 NOTE — Telephone Encounter (Signed)
Agree 

## 2015-08-06 NOTE — Telephone Encounter (Signed)
Spoke to patient  Patient states Dr Loraine LericheMark Philips wanted her to contact office. He thinks the statin -Atorvastatin may the cause of some myalgias that she is having. Per patient, Dr Venia CarbonPhilips thinks she may not be able to use the statins. He wanted patient to call the cardiologist for further instructions.  RN informed patient to take a holiday from the atorvastatin 2- 4 weeks , to see if myalgias go away , then restart medications if myalgias return , call office. An appointment can be made with [facility]( [facility]) to discuss other options. Patient verbalized understanding.

## 2015-08-06 NOTE — Telephone Encounter (Signed)
Left msg for patient to call. 

## 2015-08-17 ENCOUNTER — Telehealth: Payer: Self-pay | Admitting: *Deleted

## 2015-08-17 DIAGNOSIS — E785 Hyperlipidemia, unspecified: Secondary | ICD-10-CM

## 2015-08-17 DIAGNOSIS — Z79899 Other long term (current) drug therapy: Secondary | ICD-10-CM

## 2015-08-17 NOTE — Telephone Encounter (Signed)
-----   Message from Barrie DunkerNyasha N Carma Dwiggins sent at 06/29/2015 10:13 AM EDT ----- Regarding: Blood Work Fasting Lipid Liver

## 2015-08-17 NOTE — Telephone Encounter (Signed)
Fasting lipids liver ordered and mail to pt to get done, pt is aware

## 2015-09-10 ENCOUNTER — Other Ambulatory Visit: Payer: Self-pay | Admitting: Family Medicine

## 2015-10-28 ENCOUNTER — Ambulatory Visit (INDEPENDENT_AMBULATORY_CARE_PROVIDER_SITE_OTHER): Payer: BLUE CROSS/BLUE SHIELD | Admitting: Family Medicine

## 2015-10-28 ENCOUNTER — Encounter: Payer: Self-pay | Admitting: Family Medicine

## 2015-10-28 VITALS — BP 100/68 | HR 60 | Temp 96.9°F | Ht 61.0 in | Wt 143.0 lb

## 2015-10-28 DIAGNOSIS — Z0000 Encounter for general adult medical examination without abnormal findings: Secondary | ICD-10-CM

## 2015-10-28 DIAGNOSIS — G8929 Other chronic pain: Secondary | ICD-10-CM

## 2015-10-28 DIAGNOSIS — F329 Major depressive disorder, single episode, unspecified: Secondary | ICD-10-CM

## 2015-10-28 DIAGNOSIS — E785 Hyperlipidemia, unspecified: Secondary | ICD-10-CM

## 2015-10-28 DIAGNOSIS — F988 Other specified behavioral and emotional disorders with onset usually occurring in childhood and adolescence: Secondary | ICD-10-CM

## 2015-10-28 DIAGNOSIS — G4700 Insomnia, unspecified: Secondary | ICD-10-CM

## 2015-10-28 DIAGNOSIS — Z23 Encounter for immunization: Secondary | ICD-10-CM | POA: Diagnosis not present

## 2015-10-28 DIAGNOSIS — I10 Essential (primary) hypertension: Secondary | ICD-10-CM

## 2015-10-28 DIAGNOSIS — I25119 Atherosclerotic heart disease of native coronary artery with unspecified angina pectoris: Secondary | ICD-10-CM

## 2015-10-28 DIAGNOSIS — M549 Dorsalgia, unspecified: Secondary | ICD-10-CM

## 2015-10-28 DIAGNOSIS — IMO0001 Reserved for inherently not codable concepts without codable children: Secondary | ICD-10-CM

## 2015-10-28 DIAGNOSIS — F32A Depression, unspecified: Secondary | ICD-10-CM

## 2015-10-28 DIAGNOSIS — E876 Hypokalemia: Secondary | ICD-10-CM

## 2015-10-28 MED ORDER — FLUOCINOLONE ACETONIDE 0.01 % EX SOLN: CUTANEOUS | 0 refills | Status: DC

## 2015-10-28 MED ORDER — ALPRAZOLAM 1 MG PO TABS: 1.0000 mg | ORAL_TABLET | Freq: Four times a day (QID) | ORAL | 5 refills | Status: AC | PRN

## 2015-10-28 MED ORDER — LISINOPRIL 20 MG PO TABS: ORAL_TABLET | ORAL | 1 refills | Status: DC

## 2015-10-28 NOTE — Addendum Note (Signed)
Addended by: Margurite AuerbachOMPTON, Trinh Sanjose G on: 10/28/2015 04:54 PM   Modules accepted: Orders

## 2015-10-28 NOTE — Progress Notes (Signed)
Subjective:  Patient ID: Amy Merritt, female    DOB: 1960-12-13  Age: 55 y.o. MRN: [id number]  CC: Annual Exam   HPI Amy Merritt presents for  follow-up of hypertension. Patient has no history of headache or recent cough. Chest pain workup is as noted below. She had a little bit of shortness of breath last week which resolved spontaneously. Currently she has been a nonsmoker for 9 years. Patient also denies symptoms of TIA such as numbness weakness lateralizing. Patient checks  blood pressure at home and has not had any elevated readings recently. Patient denies side effects from his medication. States taking it regularly.  Patient also  in for follow-up of elevated cholesterol. Doing well without complaints on current medication. Denies side effects of statin including myalgia and arthralgia and nausea. Also in today for liver function testing. Currently no chest pain, shortness of breath or other cardiovascular related symptoms noted.  Recently the patient had some upper abdominal pain and was seen by GI. Initially she was felt to be constipated. However as her symptoms continued it seemed that she was having more chest pain. Because of that she was referred to cardiology. She eventually went through a cardiac catheterization this showed minimal blockages of 20% to 30% at the proximal LAD.Marland Kitchen However she apparently was told she had some small blood vessels that might cause some pain from time to time. She continues to have occasional chest and upper abdominal pain. She was also referred to urology and that appointment is pending for October 3.   Otherwise she continues to take her medications as noted below. Her pain and ADD medicines are prescribed by her pain doctor and Latta. She recently had her gynecologic exam including a breast exam with her gynecologist and was given a clean bill of health.   Depression screen [facility] 2/9 10/28/2015 01/12/2015 12/17/2014 12/02/2014 02/25/2014    Decreased Interest 2 0 0 0 1  Down, Depressed, Hopeless 1 0 0 0 0  PHQ - 2 Score 3 0 0 0 1  Altered sleeping 3 - - - -  Tired, decreased energy 2 - - - -  Change in appetite 1 - - - -  Feeling bad or failure about yourself  2 - - - -  Trouble concentrating 0 - - - -  Moving slowly or fidgety/restless 0 - - - -  Suicidal thoughts 0 - - - -  PHQ-9 Score 11 - - - -  Difficult doing work/chores Not difficult at all - - - -     History Amy Merritt has a past medical history of Arthritis; Chronic kidney disease; Depression; DJD (degenerative joint disease); DJD (degenerative joint disease) of cervical spine; DJD (degenerative joint disease), lumbar; Headache(784.0); Hypertension; IBS (irritable bowel syndrome); Kidney stone; Lymphocytic colitis; and PONV (postoperative nausea and vomiting).   She has a past surgical history that includes Lumbar disc surgery; Tubal ligation; lithotrpsy; Cervical fusion; Colonoscopy (02/11/2003); and Cardiac catheterization (N/A, 06/15/2015).   Her family history includes COPD in her paternal grandfather; Cancer in her father and maternal grandfather; Colitis in her father; Diabetes in her father; Heart attack in her mother; Heart attack (age of onset: 16) in her father; Heart disease in her paternal grandmother; Heart failure in her maternal grandmother and mother; Hypertension in her brother, father, and mother.She reports that she quit smoking about 5 years ago. She has never used smokeless tobacco. She reports that she does not drink alcohol or use drugs.  Current  Outpatient Prescriptions on File Prior to Visit  Medication Sig Dispense Refill  . acetaminophen (TYLENOL) 500 MG tablet Take 1,000 mg by mouth 3 (three) times daily as needed for headache.    . ADDERALL XR 20 MG 24 hr capsule Take 20 mg by mouth daily.     . Artificial Tear Ointment (DRY EYES OP) Place 1 drop into both eyes daily as needed (for dry eyes).    Marland Kitchen aspirin 81 MG EC tablet TAKE 1 BY MOUTH DAILY  90 tablet 0  . Aspirin-Acetaminophen-Caffeine (GOODY HEADACHE PO) Take 1 packet by mouth daily as needed (for headache).    . Cholecalciferol (VITAMIN D-3) 1000 units CAPS Take 1,000 Units by mouth daily.     . [facility] OMEGA-3 KRILL OIL 300 MG CAPS Take 300 mg by mouth daily.     Marland Kitchen gabapentin (NEURONTIN) 400 MG capsule Take 400 mg by mouth 3 (three) times daily.     . Glucosamine-Chondroitin (GLUCOSAMINE CHONDR COMPLEX PO) Take 1 tablet by mouth 2 (two) times daily.     Marland Kitchen lactulose (CHRONULAC) 10 GM/15ML solution Take 30 mLs by mouth 2 (two) times daily as needed for mild constipation.     Marland Kitchen lisinopril (PRINIVIL,ZESTRIL) 20 MG tablet TAKE 1 BY MOUTH DAILY 90 tablet 0  . methocarbamol (ROBAXIN) 750 MG tablet Take 750 mg by mouth every 6 (six) hours as needed for muscle spasms.    . Multiple Vitamin (MULTIVITAMIN) tablet Take 1 tablet by mouth daily.    . naproxen (NAPROSYN) 500 MG tablet Take 500 mg by mouth at bedtime.     . nitroGLYCERIN (NITROSTAT) 0.4 MG SL tablet Place 1 tablet (0.4 mg total) under the tongue every 5 (five) minutes as needed for chest pain. 25 tablet 3  . oxycodone (ROXICODONE) 30 MG immediate release tablet Take 30 mg by mouth every 4 (four) hours as needed (Dr Nicholaus Bloom). PAIN    . atorvastatin (LIPITOR) 40 MG tablet Take 1 tablet (40 mg total) by mouth daily. (Patient not taking: Reported on 10/28/2015) 90 tablet 3   No current facility-administered medications on file prior to visit.     ROS Review of Systems  Constitutional: Negative for appetite change, chills, diaphoresis, fatigue, fever and unexpected weight change.  HENT: Negative for congestion, ear pain, hearing loss, postnasal drip, rhinorrhea, sneezing, sore throat and trouble swallowing.   Eyes: Negative for pain.  Respiratory: Positive for shortness of breath. Negative for cough and chest tightness.   Cardiovascular: Positive for chest pain. Negative for palpitations and leg swelling.  Gastrointestinal:  Positive for abdominal pain. Negative for constipation, diarrhea, nausea and vomiting.  Endocrine: Negative for cold intolerance, heat intolerance, polydipsia, polyphagia and polyuria.  Genitourinary: Positive for frequency. Negative for dysuria and menstrual problem.  Musculoskeletal: Positive for arthralgias (left hip). Negative for joint swelling.  Skin: Negative for rash.  Allergic/Immunologic: Negative for environmental allergies.  Neurological: Negative for dizziness, weakness, numbness and headaches.  Psychiatric/Behavioral: Negative for agitation and dysphoric mood.    Objective:  BP 100/68   Pulse 60   Temp (!) 96.9 F (36.1 C) (Oral)   Ht _0  (1.549 m)   Wt 143 lb (64.9 kg)   BMI 27.02 kg/m   BP Readings from Last 3 Encounters:  10/28/15 100/68  06/29/15 121/80  06/15/15 116/70    Wt Readings from Last 3 Encounters:  10/28/15 143 lb (64.9 kg)  06/29/15 159 lb 3.2 oz (72.2 kg)  06/15/15 159 lb (72.1 kg)  Physical Exam  Constitutional: She is oriented to person, place, and time. She appears well-developed and well-nourished. No distress.  HENT:  Head: Normocephalic and atraumatic.  Right Ear: External ear normal.  Left Ear: External ear normal.  Nose: Nose normal.  Mouth/Throat: Oropharynx is clear and moist.  Eyes: Conjunctivae and EOM are normal. Pupils are equal, round, and reactive to light.  Neck: Normal range of motion. Neck supple. No thyromegaly present.  Cardiovascular: Normal rate, regular rhythm and normal heart sounds.   No murmur heard. Pulmonary/Chest: Effort normal and breath sounds normal. No respiratory distress. She has no wheezes. She has no rales.  Abdominal: Soft. Bowel sounds are normal. She exhibits no distension. There is no tenderness.  Musculoskeletal: Normal range of motion.  Lymphadenopathy:    She has no cervical adenopathy.  Neurological: She is alert and oriented to person, place, and time. She has normal reflexes.  Skin:  Skin is warm and dry.  Psychiatric: She has a normal mood and affect. Her behavior is normal. Judgment and thought content normal.    No results found for: HGBA1C  Lab Results  Component Value Date   WBC 5.6 06/12/2015   HGB 12.1 06/12/2015   HCT 37.7 06/12/2015   PLT 306 06/12/2015   GLUCOSE 80 06/29/2015   CHOL 235 (H) 02/25/2014   TRIG 89 02/25/2014   HDL 59 02/25/2014   LDLCALC 119 (H) 05/21/2012   ALT 15 03/12/2015   AST 18 03/12/2015   NA 140 06/29/2015   K 4.4 06/29/2015   CL 104 06/29/2015   CREATININE 0.90 06/29/2015   BUN 9 06/29/2015   CO2 25 06/29/2015   TSH 1.50 06/12/2015   INR 0.94 06/12/2015    No results found.  Assessment & Plan:   Amy Merritt was seen today for annual exam.  Diagnoses and all orders for this visit:  Well adult  Essential hypertension -     CMP14+EGFR -     CBC with Differential/Platelet -     TSH -     VITAMIN D 25 Hydroxy (Vit-D Deficiency, Fractures)  HLD (hyperlipidemia) -     Lipid panel  ADD (attention deficit disorder)  Back pain, chronic  Coronary artery disease involving native coronary artery of native heart with angina pectoris ([place])  Depression  Hypokalemia  Insomnia  Other orders -     ALPRAZolam (XANAX) 1 MG tablet; Take 1 tablet (1 mg total) by mouth 4 (four) times daily as needed. ANXIETY -     fluocinolone (SYNALAR) 0.01 % external solution; Dribble 2-4 drops in affected ear 3 times a day until sx clear   I have discontinued Ms. Rawdon's pantoprazole, sennosides-docusate sodium, and venlafaxine. I have also changed her ALPRAZolam. Additionally, I am having her start on fluocinolone. Lastly, I am having her maintain her gabapentin, oxycodone, Glucosamine-Chondroitin (GLUCOSAMINE CHONDR COMPLEX PO), naproxen, multivitamin, ADDERALL XR, lactulose, methocarbamol, Vitamin D-3, [facility] OMEGA-3 KRILL OIL, nitroGLYCERIN, acetaminophen, Aspirin-Acetaminophen-Caffeine (GOODY HEADACHE PO), Artificial Tear Ointment (DRY  EYES OP), atorvastatin, lisinopril, aspirin, celecoxib, conjugated estrogens, and FLUoxetine.  Meds ordered this encounter  Medications  . celecoxib (CELEBREX) 100 MG capsule    Sig: TAKE 1 CAPSULE TWICE A DAY AS NEEDED  . conjugated estrogens (PREMARIN) vaginal cream    Sig: Place 1 g vaginally.  Marland Kitchen FLUoxetine (PROZAC) 10 MG capsule    Sig: pt states now taking 39m  . ALPRAZolam (XANAX) 1 MG tablet    Sig: Take 1 tablet (1 mg total) by mouth 4 (  four) times daily as needed. ANXIETY    Dispense:  60 tablet    Refill:  5  . fluocinolone (SYNALAR) 0.01 % external solution    Sig: Dribble 2-4 drops in affected ear 3 times a day until sx clear    Dispense:  60 mL    Refill:  0     Follow-up: Return in about 6 months (around 04/26/2016).  Claretta Fraise, M.D.

## 2015-10-29 ENCOUNTER — Other Ambulatory Visit: Payer: Self-pay | Admitting: Family Medicine

## 2015-10-29 LAB — CBC WITH DIFFERENTIAL/PLATELET
Basophils Absolute: 0 10*3/uL (ref 0.0–0.2)
Basos: 1 %
EOS (ABSOLUTE): 0.1 10*3/uL (ref 0.0–0.4)
EOS: 2 %
HEMATOCRIT: 38.1 % (ref 34.0–46.6)
HEMOGLOBIN: 12.7 g/dL (ref 11.1–15.9)
Immature Grans (Abs): 0 10*3/uL (ref 0.0–0.1)
Immature Granulocytes: 0 %
LYMPHS ABS: 1.7 10*3/uL (ref 0.7–3.1)
Lymphs: 32 %
MCH: 31.4 pg (ref 26.6–33.0)
MCHC: 33.3 g/dL (ref 31.5–35.7)
MCV: 94 fL (ref 79–97)
MONOS ABS: 0.5 10*3/uL (ref 0.1–0.9)
Monocytes: 9 %
NEUTROS ABS: 2.9 10*3/uL (ref 1.4–7.0)
Neutrophils: 56 %
Platelets: 341 10*3/uL (ref 150–379)
RBC: 4.04 x10E6/uL (ref 3.77–5.28)
RDW: 14.1 % (ref 12.3–15.4)
WBC: 5.2 10*3/uL (ref 3.4–10.8)

## 2015-10-29 LAB — CMP14+EGFR
ALBUMIN: 4.3 g/dL (ref 3.5–5.5)
ALK PHOS: 102 IU/L (ref 39–117)
ALT: 18 IU/L (ref 0–32)
AST: 19 IU/L (ref 0–40)
Albumin/Globulin Ratio: 1.5 (ref 1.2–2.2)
BILIRUBIN TOTAL: 0.3 mg/dL (ref 0.0–1.2)
BUN / CREAT RATIO: 12 (ref 9–23)
BUN: 11 mg/dL (ref 6–24)
CHLORIDE: 103 mmol/L (ref 96–106)
CO2: 16 mmol/L — ABNORMAL LOW (ref 18–29)
CREATININE: 0.92 mg/dL (ref 0.57–1.00)
Calcium: 9.7 mg/dL (ref 8.7–10.2)
GFR calc non Af Amer: 72 mL/min/{1.73_m2} (ref >59)
GFR, EST AFRICAN AMERICAN: 83 mL/min/{1.73_m2} (ref >59)
GLOBULIN, TOTAL: 2.8 g/dL (ref 1.5–4.5)
Glucose: 79 mg/dL (ref 65–99)
Potassium: 4.9 mmol/L (ref 3.5–5.2)
SODIUM: 138 mmol/L (ref 134–144)
TOTAL PROTEIN: 7.1 g/dL (ref 6.0–8.5)

## 2015-10-29 LAB — LIPID PANEL
CHOL/HDL RATIO: 3.6 ratio (ref 0.0–4.4)
Cholesterol, Total: 233 mg/dL — ABNORMAL HIGH (ref 100–199)
HDL: 64 mg/dL (ref >39)
LDL CALC: 141 mg/dL — AB (ref 0–99)
TRIGLYCERIDES: 139 mg/dL (ref 0–149)
VLDL Cholesterol Cal: 28 mg/dL (ref 5–40)

## 2015-10-29 LAB — TSH: TSH: 1.6 u[IU]/mL (ref 0.450–4.500)

## 2015-10-29 LAB — VITAMIN D 25 HYDROXY (VIT D DEFICIENCY, FRACTURES): VIT D 25 HYDROXY: 26.3 ng/mL — AB (ref 30.0–100.0)

## 2015-10-29 NOTE — Telephone Encounter (Signed)
Please have patient come in for a visit if she is ill again so we can reassess and retest.

## 2015-10-29 NOTE — Telephone Encounter (Signed)
Patient aware that she will need to be seen for antibiotic.  Patient states that she does not feel like coming in for an appt.

## 2015-10-30 ENCOUNTER — Telehealth: Payer: Self-pay | Admitting: Family Medicine

## 2015-10-30 NOTE — Telephone Encounter (Signed)
Patient called stating that she has been running a fever of 101.3, fatigue, headache and nasal congestion. Informed patient that she will need to be seen.  Patient states that if she is not feeling any better Monday she will call back to make an appointment

## 2015-11-02 ENCOUNTER — Other Ambulatory Visit: Payer: Self-pay | Admitting: *Deleted

## 2015-11-02 MED ORDER — VITAMIN D (ERGOCALCIFEROL) 1.25 MG (50000 UNIT) PO CAPS: 50000.0000 [IU] | ORAL_CAPSULE | ORAL | 0 refills | Status: DC

## 2015-11-05 ENCOUNTER — Other Ambulatory Visit: Payer: Self-pay | Admitting: Orthopaedic Surgery

## 2015-11-05 DIAGNOSIS — M4322 Fusion of spine, cervical region: Secondary | ICD-10-CM

## 2015-11-09 ENCOUNTER — Ambulatory Visit (INDEPENDENT_AMBULATORY_CARE_PROVIDER_SITE_OTHER): Payer: BLUE CROSS/BLUE SHIELD | Admitting: Family Medicine

## 2015-11-09 ENCOUNTER — Other Ambulatory Visit: Payer: Self-pay | Admitting: *Deleted

## 2015-11-09 ENCOUNTER — Telehealth: Payer: Self-pay | Admitting: Family Medicine

## 2015-11-09 ENCOUNTER — Encounter: Payer: Self-pay | Admitting: Family Medicine

## 2015-11-09 VITALS — BP 131/75 | HR 62 | Temp 97.0°F | Ht 61.0 in | Wt 149.1 lb

## 2015-11-09 DIAGNOSIS — J0100 Acute maxillary sinusitis, unspecified: Secondary | ICD-10-CM | POA: Diagnosis not present

## 2015-11-09 DIAGNOSIS — F411 Generalized anxiety disorder: Secondary | ICD-10-CM | POA: Diagnosis not present

## 2015-11-09 DIAGNOSIS — L280 Lichen simplex chronicus: Secondary | ICD-10-CM | POA: Diagnosis not present

## 2015-11-09 DIAGNOSIS — I10 Essential (primary) hypertension: Secondary | ICD-10-CM | POA: Diagnosis not present

## 2015-11-09 MED ORDER — AMOXICILLIN-POT CLAVULANATE 875-125 MG PO TABS: 1.0000 | ORAL_TABLET | Freq: Two times a day (BID) | ORAL | 0 refills | Status: DC

## 2015-11-09 MED ORDER — BETAMETHASONE SOD PHOS & ACET 6 (3-3) MG/ML IJ SUSP
6.0000 mg | Freq: Once | INTRAMUSCULAR | Status: AC
  Administered 2015-11-09: 6 mg via INTRAMUSCULAR

## 2015-11-09 MED ORDER — AMOXICILLIN-POT CLAVULANATE 875-125 MG PO TABS
1.0000 | ORAL_TABLET | Freq: Two times a day (BID) | ORAL | 0 refills | Status: DC
Start: 2015-11-09 — End: 2015-12-10

## 2015-11-09 NOTE — Progress Notes (Signed)
RX cancelled with mail order and sent [facility] per pt request Pt notified

## 2015-11-09 NOTE — Progress Notes (Signed)
Subjective:  Patient ID: Amy Merritt, female    DOB: 09-17-1960  Age: 55 y.o. MRN: [id number]  CC: Sinusitis (pt here c/o what she thinks is a sinus infection and also has a rash that usually occurs in the afternoon)   HPI Amy SchlatterRenee V Neaves presents for Symptoms include congestion, facial pain, nasal congestion, non productive cough, post nasal drip and sinus pressure withfever to 101.7, chills, no night sweats or weight loss. Onset of symptoms was 10 days ago, gradually worsening since that time.   Very anxious about upcoming myelogram. She has a broken screw in her neck. She is concerned about having a blood patch afterwards because she's had several in the past.   History Arista has a past medical history of Arthritis; Chronic kidney disease; Depression; DJD (degenerative joint disease); DJD (degenerative joint disease) of cervical spine; DJD (degenerative joint disease), lumbar; Headache(784.0); Hypertension; IBS (irritable bowel syndrome); Kidney stone; Lymphocytic colitis; and PONV (postoperative nausea and vomiting).   She has a past surgical history that includes Lumbar disc surgery; Tubal ligation; lithotrpsy; Cervical fusion; Colonoscopy (02/11/2003); and Cardiac catheterization (N/A, 06/15/2015).   Her family history includes COPD in her paternal grandfather; Cancer in her father and maternal grandfather; Colitis in her father; Diabetes in her father; Heart attack in her mother; Heart attack (age of onset: 7350) in her father; Heart disease in her paternal grandmother; Heart failure in her maternal grandmother and mother; Hypertension in her brother, father, and mother.She reports that she quit smoking about 5 years ago. She has never used smokeless tobacco. She reports that she does not drink alcohol or use drugs.    ROS Review of Systems  Constitutional: Negative for activity change, appetite change, chills and fever.  HENT: Positive for congestion, postnasal drip, rhinorrhea and  sinus pressure. Negative for ear discharge, ear pain, hearing loss, nosebleeds, sneezing and trouble swallowing.   Respiratory: Negative for chest tightness and shortness of breath.   Cardiovascular: Negative for chest pain and palpitations.  Skin: Negative for rash.    Objective:  BP 131/75   Pulse 62   Temp 97 F (36.1 C) (Oral)   Ht 5\' 1"  (1.549 m)   Wt 149 lb 2 oz (67.6 kg)   BMI 28.18 kg/m   BP Readings from Last 3 Encounters:  11/09/15 131/75  10/28/15 100/68  06/29/15 121/80    Wt Readings from Last 3 Encounters:  11/09/15 149 lb 2 oz (67.6 kg)  10/28/15 143 lb (64.9 kg)  06/29/15 159 lb 3.2 oz (72.2 kg)     Physical Exam  Constitutional: She appears well-developed and well-nourished.  HENT:  Head: Normocephalic and atraumatic.  Right Ear: Tympanic membrane and external ear normal. No decreased hearing is noted.  Left Ear: Tympanic membrane and external ear normal. No decreased hearing is noted.  Nose: Mucosal edema present. Right sinus exhibits no frontal sinus tenderness. Left sinus exhibits maxillary sinus tenderness. Left sinus exhibits no frontal sinus tenderness.  Mouth/Throat: No oropharyngeal exudate or posterior oropharyngeal erythema.  Neck: No Brudzinski's sign noted.  Pulmonary/Chest: Breath sounds normal. No respiratory distress.  Lymphadenopathy:       Head (right side): No preauricular adenopathy present.       Head (left side): No preauricular adenopathy present.       Right cervical: No superficial cervical adenopathy present.      Left cervical: No superficial cervical adenopathy present.  Skin: Skin is warm and dry. Rash (faint erythema at lateral left wrist with  excoriation) noted. There is erythema.  Psychiatric: Thought content normal. Her mood appears anxious. Her affect is labile. Her speech is rapid and/or pressured. She is agitated. Cognition and memory are normal. She exhibits a depressed mood.     Lab Results  Component Value Date     WBC 5.2 10/28/2015   HGB 12.1 06/12/2015   HCT 38.1 10/28/2015   PLT 341 10/28/2015   GLUCOSE 79 10/28/2015   CHOL 233 (H) 10/28/2015   TRIG 139 10/28/2015   HDL 64 10/28/2015   LDLCALC 141 (H) 10/28/2015   ALT 18 10/28/2015   AST 19 10/28/2015   NA 138 10/28/2015   K 4.9 10/28/2015   CL 103 10/28/2015   CREATININE 0.92 10/28/2015   BUN 11 10/28/2015   CO2 16 (L) 10/28/2015   TSH 1.600 10/28/2015   INR 0.94 06/12/2015    No results found.  Assessment & Plan:   Patsy was seen today for sinusitis.  Diagnoses and all orders for this visit:  Neurodermatitis -     betamethasone acetate-betamethasone sodium phosphate (CELESTONE) injection 6 mg; Inject 1 mL (6 mg total) into the muscle once.  Acute maxillary sinusitis, recurrence not specified -     betamethasone acetate-betamethasone sodium phosphate (CELESTONE) injection 6 mg; Inject 1 mL (6 mg total) into the muscle once.  Essential hypertension  Anxiety state  Other orders -     amoxicillin-clavulanate (AUGMENTIN) 875-125 MG tablet; Take 1 tablet by mouth 2 (two) times daily. Take all of this medication      I am having Ms. Stalling start on amoxicillin-clavulanate. I am also having her maintain her gabapentin, oxycodone, Glucosamine-Chondroitin (GLUCOSAMINE CHONDR COMPLEX PO), multivitamin, ADDERALL XR, methocarbamol, [facility] OMEGA-3 KRILL OIL, nitroGLYCERIN, acetaminophen, Aspirin-Acetaminophen-Caffeine (GOODY HEADACHE PO), Artificial Tear Ointment (DRY EYES OP), atorvastatin, aspirin, celecoxib, conjugated estrogens, FLUoxetine, ALPRAZolam, fluocinolone, lisinopril, Vitamin D (Ergocalciferol), and pantoprazole. We will continue to administer betamethasone acetate-betamethasone sodium phosphate.  Meds ordered this encounter  Medications  . pantoprazole (PROTONIX) 40 MG tablet    Refill:  1  . amoxicillin-clavulanate (AUGMENTIN) 875-125 MG tablet    Sig: Take 1 tablet by mouth 2 (two) times daily. Take all of this  medication    Dispense:  20 tablet    Refill:  0  . betamethasone acetate-betamethasone sodium phosphate (CELESTONE) injection 6 mg     Follow-up: Return if symptoms worsen or fail to improve and as previously recommended.  Mechele Claude, M.D.

## 2015-11-16 ENCOUNTER — Ambulatory Visit
Admission: RE | Admit: 2015-11-16 | Discharge: 2015-11-16 | Disposition: A | Discharge status: Home / Self Care | Payer: BLUE CROSS/BLUE SHIELD | Source: Ambulatory Visit | Attending: Orthopaedic Surgery | Admitting: Orthopaedic Surgery

## 2015-11-16 DIAGNOSIS — M4322 Fusion of spine, cervical region: Secondary | ICD-10-CM

## 2015-11-16 MED ORDER — DIAZEPAM 5 MG PO TABS
10.0000 mg | ORAL_TABLET | Freq: Once | ORAL | Status: AC
  Administered 2015-11-16: 10 mg via ORAL

## 2015-11-16 MED ORDER — HYDROMORPHONE HCL 1 MG/ML IJ SOLN
1.0000 mg | Freq: Once | INTRAMUSCULAR | Status: AC
  Administered 2015-11-16: 1 mg via INTRAMUSCULAR

## 2015-11-16 MED ORDER — ONDANSETRON HCL 4 MG/2ML IJ SOLN
4.0000 mg | Freq: Once | INTRAMUSCULAR | Status: AC
  Administered 2015-11-16: 4 mg via INTRAMUSCULAR

## 2015-11-16 MED ORDER — IOPAMIDOL (ISOVUE-M 300) INJECTION 61%
10.0000 mL | Freq: Once | INTRAMUSCULAR | Status: AC | PRN
  Administered 2015-11-16: 10 mL via INTRATHECAL

## 2015-11-16 MED ORDER — MEPERIDINE HCL 100 MG/ML IJ SOLN
75.0000 mg | Freq: Once | INTRAMUSCULAR | Status: AC
  Administered 2015-11-16: 75 mg via INTRAMUSCULAR

## 2015-11-16 NOTE — Discharge Instructions (Signed)

## 2015-11-17 ENCOUNTER — Telehealth: Payer: Self-pay

## 2015-11-17 NOTE — Telephone Encounter (Signed)
Patient called after having a cervical myelogram here yesterday.  She denies headache but is complaining of significant pain in her left lower back and into the left hip.  She just has completed her 24 hours of strict bedrest, so encouraged her to move around and stretch out her back gently.  She states she has been using her Oxycodone 30mg  tabs, as well as her Robaxin.  I suggested NSAIDs and heat; she said she would definitely try the heat.  She has an appointment with Dr. Noel Geroldohen tomorrow, and will follow up with him about her symptoms.  jkl

## 2015-11-18 ENCOUNTER — Telehealth: Payer: Self-pay

## 2015-11-18 NOTE — Telephone Encounter (Signed)
Called patient today after speaking with her 11/17/15 following her cervical myelogram here 11/16/15.  She continues to have a positional headache with nausea and vomiting, though says this spinal headache is nowhere near as bad as previous ones she has had.  She continues with almost unbearable low back pain following the myelogram.  She understands an epidural blood patch may exacerbate her back pain, partly because of the mass of blood that would be in the epidural space and partly because of the 24 hours of bedrest that would follow.  I told her if she needed a blood patch I would premedicate her with valium, like for her myelo, and give her a pain shot to help with her back pain.  She's not sure about the blood patch; is seeing Dr. Noel Geroldohen this afternoon to discuss new back pain and to follow up on the cervical myelogram.  jkl

## 2015-11-19 ENCOUNTER — Other Ambulatory Visit: Payer: Self-pay | Admitting: Orthopaedic Surgery

## 2015-11-19 DIAGNOSIS — G971 Other reaction to spinal and lumbar puncture: Secondary | ICD-10-CM

## 2015-11-20 ENCOUNTER — Telehealth: Payer: Self-pay

## 2015-11-20 NOTE — Telephone Encounter (Signed)
Amy Merritt called me after I told her Amy Merritt with Amy Merritt was faxing Amy Merritt an order for an Epidural Blood Patch as needed following her myelogram with Amy Merritt 11/16/15.  They did not want to give her any more or new pain medication for her left low back pain she has been having since the myelogram; they are referring her to her pain management doctor, Amy Merritt.  Amy Merritt does not want to have the EBP yet since she's concerned it may aggravate her back pain.  I told her the order was good for six months, so she can pursue it any time she feels the need.  jkl

## 2015-11-22 ENCOUNTER — Emergency Department (HOSPITAL_COMMUNITY): Payer: BLUE CROSS/BLUE SHIELD

## 2015-11-22 ENCOUNTER — Encounter (HOSPITAL_COMMUNITY): Payer: Self-pay

## 2015-11-22 ENCOUNTER — Emergency Department (HOSPITAL_COMMUNITY)
Admission: EM | Admit: 2015-11-22 | Discharge: 2015-11-22 | Disposition: A | Discharge status: Home / Self Care | Payer: BLUE CROSS/BLUE SHIELD | Attending: Emergency Medicine | Admitting: Emergency Medicine

## 2015-11-22 DIAGNOSIS — N189 Chronic kidney disease, unspecified: Secondary | ICD-10-CM | POA: Diagnosis not present

## 2015-11-22 DIAGNOSIS — I129 Hypertensive chronic kidney disease with stage 1 through stage 4 chronic kidney disease, or unspecified chronic kidney disease: Secondary | ICD-10-CM | POA: Insufficient documentation

## 2015-11-22 DIAGNOSIS — R1084 Generalized abdominal pain: Secondary | ICD-10-CM | POA: Insufficient documentation

## 2015-11-22 DIAGNOSIS — R197 Diarrhea, unspecified: Secondary | ICD-10-CM | POA: Diagnosis not present

## 2015-11-22 DIAGNOSIS — Z7982 Long term (current) use of aspirin: Secondary | ICD-10-CM | POA: Insufficient documentation

## 2015-11-22 DIAGNOSIS — Z792 Long term (current) use of antibiotics: Secondary | ICD-10-CM | POA: Diagnosis not present

## 2015-11-22 DIAGNOSIS — Z87891 Personal history of nicotine dependence: Secondary | ICD-10-CM | POA: Diagnosis not present

## 2015-11-22 DIAGNOSIS — R112 Nausea with vomiting, unspecified: Secondary | ICD-10-CM | POA: Diagnosis present

## 2015-11-22 DIAGNOSIS — Z79899 Other long term (current) drug therapy: Secondary | ICD-10-CM | POA: Diagnosis not present

## 2015-11-22 DIAGNOSIS — R109 Unspecified abdominal pain: Secondary | ICD-10-CM

## 2015-11-22 LAB — LIPASE, BLOOD: Lipase: 20 U/L (ref 11–51)

## 2015-11-22 LAB — URINALYSIS, ROUTINE W REFLEX MICROSCOPIC
BILIRUBIN URINE: NEGATIVE
Glucose, UA: NEGATIVE mg/dL
Hgb urine dipstick: NEGATIVE
KETONES UR: NEGATIVE mg/dL
LEUKOCYTES UA: NEGATIVE
NITRITE: NEGATIVE
PROTEIN: NEGATIVE mg/dL
Specific Gravity, Urine: 1.01 (ref 1.005–1.030)
pH: 7.5 (ref 5.0–8.0)

## 2015-11-22 LAB — CBC
HCT: 40.8 % (ref 36.0–46.0)
Hemoglobin: 13.9 g/dL (ref 12.0–15.0)
MCH: 32.5 pg (ref 26.0–34.0)
MCHC: 34.1 g/dL (ref 30.0–36.0)
MCV: 95.3 fL (ref 78.0–100.0)
Platelets: 344 10*3/uL (ref 150–400)
RBC: 4.28 MIL/uL (ref 3.87–5.11)
RDW: 13.5 % (ref 11.5–15.5)
WBC: 11.1 10*3/uL — AB (ref 4.0–10.5)

## 2015-11-22 LAB — COMPREHENSIVE METABOLIC PANEL
ALBUMIN: 4.3 g/dL (ref 3.5–5.0)
ALK PHOS: 131 U/L — AB (ref 38–126)
ALT: 46 U/L (ref 14–54)
ANION GAP: 9 (ref 5–15)
AST: 37 U/L (ref 15–41)
BILIRUBIN TOTAL: 0.4 mg/dL (ref 0.3–1.2)
BUN: 7 mg/dL (ref 6–20)
CALCIUM: 9.6 mg/dL (ref 8.9–10.3)
CO2: 29 mmol/L (ref 22–32)
Chloride: 96 mmol/L — ABNORMAL LOW (ref 101–111)
Creatinine, Ser: 0.84 mg/dL (ref 0.44–1.00)
GFR calc Af Amer: >60 mL/min (ref >60)
GLUCOSE: 109 mg/dL — AB (ref 65–99)
Potassium: 3.6 mmol/L (ref 3.5–5.1)
Sodium: 134 mmol/L — ABNORMAL LOW (ref 135–145)
TOTAL PROTEIN: 7.9 g/dL (ref 6.5–8.1)

## 2015-11-22 MED ORDER — SODIUM CHLORIDE 0.9 % IV SOLN: INTRAVENOUS | Status: DC

## 2015-11-22 MED ORDER — ONDANSETRON HCL 4 MG/2ML IJ SOLN
4.0000 mg | Freq: Once | INTRAMUSCULAR | Status: AC | PRN
  Administered 2015-11-22: 4 mg via INTRAVENOUS
  Filled 2015-11-22: qty 2

## 2015-11-22 MED ORDER — IOPAMIDOL (ISOVUE-300) INJECTION 61%
100.0000 mL | Freq: Once | INTRAVENOUS | Status: AC | PRN
  Administered 2015-11-22: 100 mL via INTRAVENOUS

## 2015-11-22 MED ORDER — PROMETHAZINE HCL 25 MG RE SUPP: 25.0000 mg | Freq: Four times a day (QID) | RECTAL | 0 refills | Status: DC | PRN

## 2015-11-22 MED ORDER — SODIUM CHLORIDE 0.9 % IV BOLUS (SEPSIS)
1000.0000 mL | Freq: Once | INTRAVENOUS | Status: AC
  Administered 2015-11-22: 1000 mL via INTRAVENOUS

## 2015-11-22 MED ORDER — PROMETHAZINE HCL 12.5 MG PO TABS
ORAL_TABLET | ORAL | Status: AC
  Administered 2015-11-22: 12.5 mg via ORAL
  Filled 2015-11-22: qty 1

## 2015-11-22 MED ORDER — ONDANSETRON 4 MG PO TBDP: 4.0000 mg | ORAL_TABLET | Freq: Three times a day (TID) | ORAL | 0 refills | Status: DC | PRN

## 2015-11-22 MED ORDER — PROMETHAZINE HCL 25 MG/ML IJ SOLN
12.5000 mg | Freq: Once | INTRAMUSCULAR | Status: AC
  Administered 2015-11-22: 12.5 mg via INTRAVENOUS
  Filled 2015-11-22: qty 1

## 2015-11-22 MED ORDER — IOPAMIDOL (ISOVUE-300) INJECTION 61%
INTRAVENOUS | Status: AC
  Administered 2015-11-22: 30 mL via ORAL
  Filled 2015-11-22: qty 30

## 2015-11-22 MED ORDER — PROMETHAZINE HCL 12.5 MG PO TABS
12.5000 mg | ORAL_TABLET | Freq: Once | ORAL | Status: AC
  Administered 2015-11-22: 12.5 mg via ORAL

## 2015-11-22 MED ORDER — FENTANYL CITRATE (PF) 100 MCG/2ML IJ SOLN
50.0000 ug | INTRAMUSCULAR | Status: DC | PRN
  Administered 2015-11-22: 50 ug via INTRAVENOUS
  Filled 2015-11-22: qty 2

## 2015-11-22 NOTE — Discharge Instructions (Signed)
Take the prescriptions as directed.  Increase your fluid intake (ie:  Gatoraide) for the next few days.  Eat a bland diet and advance to your regular diet slowly as you can tolerate it.   Avoid full strength juices, as well as milk and milk products until your diarrhea has resolved.   Call your regular medical doctor Monday to schedule a follow up appointment this week.  Return to the Emergency Department immediately if not improving (or even worsening) despite taking the medicines as prescribed, any black or bloody stool or vomit, if you develop a fever over "101," or for any other concerns. ° °

## 2015-11-22 NOTE — ED Notes (Signed)
Patient transported to CT 

## 2015-11-22 NOTE — ED Triage Notes (Signed)
Reports of abdominal pain, nausea and vomiting that started yesterday. States she had myleogram Monday. States she has had headache. Also reports of recent falls.

## 2015-11-22 NOTE — ED Provider Notes (Signed)
[facility]-EM[facility]ENCY DEPT Provider Note   CSN: [id number] Arrival date & time: 11/22/15  1428     History   Chief Complaint Chief Complaint  Patient presents with  . Abdominal Pain  . Emesis    HPI Amy Merritt is a 55 y.o. female.  HPI  Pt was seen at 1545.  Per pt, c/o gradual onset and persistence of constant generalized abd "pain" since yesterday.  Has been associated with multiple intermittent episodes of N/V.  Describes the abd pain as "cramping."  Denies diarrhea, no fevers, no back pain, no rash, no CP/SOB, no black or blood in stools or emesis.      Past Medical History:  Diagnosis Date  . Arthritis   . Chronic kidney disease   . Depression   . DJD (degenerative joint disease)   . DJD (degenerative joint disease) of cervical spine   . DJD (degenerative joint disease), lumbar   . Headache(784.0)   . Hypertension   . IBS (irritable bowel syndrome)   . Kidney stone   . Lymphocytic colitis   . PONV (postoperative nausea and vomiting)     Patient Active Problem List   Diagnosis Date Noted  . Coronary artery disease involving native coronary artery of native heart with angina pectoris (HCC)   . Frequency of urination 08/20/2012  . Hypokalemia 08/04/2012  . Anemia 08/04/2012  . HTN (hypertension) 05/21/2012  . Back pain, chronic 05/21/2012  . ADD (attention deficit disorder) 05/21/2012  . HLD (hyperlipidemia) 05/21/2012  . Depression 05/21/2012  . Insomnia 05/21/2012    Past Surgical History:  Procedure Laterality Date  . CARDIAC CATHETERIZATION N/A 06/15/2015   Procedure: Left Heart Cath and Coronary Angiography;  Surgeon: Kathleene Hazel, MD;  Location: [facility] ([place] INVASIVE CV LAB;  Service: Cardiovascular;  Laterality: N/A;  . CERVICAL FUSION     x 2  . COLONOSCOPY  02/11/2003  . DG MYLEOGRAM LUMBAR SPINE ([facility] HX)    . lithotrpsy    . LUMBAR DISC SURGERY     x 4- lower back with rods and screws  . TUBAL LIGATION        Home Medications    Prior to  Admission medications   Medication Sig Start Date End Date Taking? Authorizing Provider  acetaminophen (TYLENOL) 500 MG tablet Take 1,000 mg by mouth 3 (three) times daily as needed for headache.   Yes Historical Provider, MD  ADDERALL XR 20 MG 24 hr capsule Take 20 mg by mouth daily.  02/09/15  Yes Historical Provider, MD  ALPRAZolam Prudy Feeler) 1 MG tablet Take 1 tablet (1 mg total) by mouth 4 (four) times daily as needed. ANXIETY 10/28/15  Yes Mechele Claude, MD  amoxicillin-clavulanate (AUGMENTIN) 875-125 MG tablet Take 1 tablet by mouth 2 (two) times daily. Take all of this medication 11/09/15  Yes Mechele Claude, MD  Artificial Tear Ointment (DRY EYES OP) Place 1 drop into both eyes daily as needed (for dry eyes).   Yes Historical Provider, MD  aspirin 81 MG EC tablet TAKE 1 BY MOUTH DAILY 09/10/15  Yes Mechele Claude, MD  Aspirin-Acetaminophen-Caffeine (GOODY HEADACHE PO) Take 1 packet by mouth daily as needed (for headache).   Yes Historical Provider, MD  atorvastatin (LIPITOR) 40 MG tablet Take 1 tablet (40 mg total) by mouth daily. 07/02/15  Yes Rollene Rotunda, MD  celecoxib (CELEBREX) 100 MG capsule TAKE 1 CAPSULE TWICE A DAY AS NEEDED FOR PAIN 09/08/15  Yes Historical Provider, MD  conjugated estrogens (PREMARIN) vaginal cream Place  1 g vaginally at bedtime.  10/28/15  Yes Historical Provider, MD  [employer] OMEGA-3 KRILL OIL 300 MG CAPS Take 300 mg by mouth daily.    Yes Historical Provider, MD  fluocinolone (SYNALAR) 0.01 % external solution Dribble 2-4 drops in affected ear 3 times a day until sx clear Patient taking differently: Apply 1 application topically 3 (three) times daily. Dribble 2-4 drops in affected ear 3 times a day until sx clear 10/28/15  Yes Mechele Claude, MD  FLUoxetine (PROZAC) 10 MG capsule TAKE 60MG  BY MOUTH DAILY 08/05/15  Yes Historical Provider, MD  gabapentin (NEURONTIN) 400 MG capsule Take 400 mg by mouth 3 (three) times daily.    Yes Historical Provider, MD  Glucosamine-Chondroitin  (GLUCOSAMINE CHONDR COMPLEX PO) Take 1 tablet by mouth 2 (two) times daily.    Yes Historical Provider, MD  lisinopril (PRINIVIL,ZESTRIL) 20 MG tablet TAKE 1 BY MOUTH DAILY Patient taking differently: Take 20 mg by mouth daily.  10/28/15  Yes Mechele Claude, MD  methocarbamol (ROBAXIN) 750 MG tablet Take 750 mg by mouth every 6 (six) hours as needed for muscle spasms.   Yes Historical Provider, MD  Multiple Vitamin (MULTIVITAMIN) tablet Take 1 tablet by mouth daily.   Yes Historical Provider, MD  nitroGLYCERIN (NITROSTAT) 0.4 MG SL tablet Place 1 tablet (0.4 mg total) under the tongue every 5 (five) minutes as needed for chest pain. 06/12/15  Yes Rollene Rotunda, MD  oxycodone (ROXICODONE) 30 MG immediate release tablet Take 30 mg by mouth every 4 (four) hours as needed for pain (Dr Thyra Breed).    Yes Historical Provider, MD  pantoprazole (PROTONIX) 40 MG tablet Take 40 mg by mouth daily.  11/04/15  Yes Historical Provider, MD  Vitamin D, Ergocalciferol, (DRISDOL) 50000 units CAPS capsule Take 1 capsule (50,000 Units total) by mouth 2 (two) times a week. 11/02/15  Yes Mechele Claude, MD    Family History Family History  Problem Relation Age of Onset  . Colitis Father   . Hypertension Father   . Diabetes Father   . Cancer Father     ?  Marland Kitchen Heart attack Father 40    CABG  . Hypertension Mother   . Heart failure Mother   . Heart attack Mother     Died in her later 21s  . Hypertension Brother   . Heart failure Maternal Grandmother   . Cancer Maternal Grandfather   . Heart disease Paternal Grandmother   . COPD Paternal Grandfather   . Diabetes      multiple aunts  . Colon cancer Neg Hx   . Rectal cancer Neg Hx   . Stomach cancer Neg Hx   . Esophageal cancer Neg Hx     Social History Social History  Substance Use Topics  . Smoking status: Former Smoker    Quit date: 08/27/2010  . Smokeless tobacco: Never Used  . Alcohol use No     Allergies   Morphine and related   Review of  Systems Review of Systems ROS: Statement: All systems negative except as marked or noted in the HPI; Constitutional: Negative for fever and chills. ; ; Eyes: Negative for eye pain, redness and discharge. ; ; ENMT: Negative for ear pain, hoarseness, nasal congestion, sinus pressure and sore throat. ; ; Cardiovascular: Negative for chest pain, palpitations, diaphoresis, dyspnea and peripheral edema. ; ; Respiratory: Negative for cough, wheezing and stridor. ; ; Gastrointestinal: +N/V, abd pain. Negative for diarrhea, blood in stool, hematemesis, jaundice and rectal  bleeding. . ; ; Genitourinary: Negative for dysuria, flank pain and hematuria. ; ; Musculoskeletal: Negative for back pain and neck pain. Negative for swelling and trauma.; ; Skin: Negative for pruritus, rash, abrasions, blisters, bruising and skin lesion.; ; Neuro: Negative for headache, lightheadedness and neck stiffness. Negative for weakness, altered level of consciousness, altered mental status, extremity weakness, paresthesias, involuntary movement, seizure and syncope.       Physical Exam Updated Vital Signs BP 161/79 (BP Location: Right Arm)   Pulse 65   Temp 98 F (36.7 C) (Oral)   Resp 18   Ht 5\' 1"  (1.549 m)   Wt 149 lb (67.6 kg)   SpO2 98%   BMI 28.15 kg/m   Physical Exam 1550: Physical examination:  Nursing notes reviewed; Vital signs and O2 SAT reviewed;  Constitutional: Well developed, Well nourished, Uncomfortable appearing.; Head:  Normocephalic, atraumatic; Eyes: EOMI, PERRL, No scleral icterus; ENMT: Mouth and pharynx normal, Mucous membranes moist; Neck: Supple, Full range of motion, No lymphadenopathy; Cardiovascular: Regular rate and rhythm, No gallop; Respiratory: Breath sounds clear & equal bilaterally, No wheezes.  Speaking full sentences with ease, Normal respiratory effort/excursion; Chest: Nontender, Movement normal; Abdomen: Soft, +diffuse tenderness to palp. No rebound or guarding. Nondistended, Normal  bowel sounds; Genitourinary: No CVA tenderness; Extremities: Pulses normal, No tenderness, No edema, No calf edema or asymmetry.; Neuro: AA&Ox3, Major CN grossly intact.  Speech clear. No gross focal motor or sensory deficits in extremities.; Skin: Color normal, Warm, Dry.   ED Treatments / Results  Labs (all labs ordered are listed, but only abnormal results are displayed)   EKG  EKG Interpretation None       Radiology   Procedures Procedures (including critical care time)  Medications Ordered in ED Medications  0.9 %  sodium chloride infusion (not administered)  ondansetron (ZOFRAN) injection 4 mg (4 mg Intravenous Given 11/22/15 1459)  sodium chloride 0.9 % bolus 1,000 mL (1,000 mLs Intravenous New Bag/Given 11/22/15 1601)  promethazine (PHENERGAN) injection 12.5 mg (12.5 mg Intravenous Given 11/22/15 1600)     Initial Impression / Assessment and Plan / ED Course  I have reviewed the triage vital signs and the nursing notes.  Pertinent labs & imaging results that were available during my care of the patient were reviewed by me and considered in my medical decision making (see chart for details).  MDM Reviewed: previous chart, nursing note and vitals Reviewed previous: labs Interpretation: labs, x-ray and CT scan    Results for orders placed or performed during the hospital encounter of 11/22/15  Lipase, blood  Result Value Ref Range   Lipase 20 11 - 51 U/L  Comprehensive metabolic panel  Result Value Ref Range   Sodium 134 (L) 135 - 145 mmol/L   Potassium 3.6 3.5 - 5.1 mmol/L   Chloride 96 (L) 101 - 111 mmol/L   CO2 29 22 - 32 mmol/L   Glucose, Bld 109 (H) 65 - 99 mg/dL   BUN 7 6 - 20 mg/dL   Creatinine, Ser 4.09 0.44 - 1.00 mg/dL   Calcium 9.6 8.9 - 81.1 mg/dL   Total Protein 7.9 6.5 - 8.1 g/dL   Albumin 4.3 3.5 - 5.0 g/dL   AST 37 15 - 41 U/L   ALT 46 14 - 54 U/L   Alkaline Phosphatase 131 (H) 38 - 126 U/L   Total Bilirubin 0.4 0.3 - 1.2 mg/dL   GFR  calc non Af Amer >60 >60 mL/min   GFR calc  Af Amer >60 >60 mL/min   Anion gap 9 5 - 15  CBC  Result Value Ref Range   WBC 11.1 (H) 4.0 - 10.5 K/uL   RBC 4.28 3.87 - 5.11 MIL/uL   Hemoglobin 13.9 12.0 - 15.0 g/dL   HCT 78.240.8 95.636.0 - 21.346.0 %   MCV 95.3 78.0 - 100.0 fL   MCH 32.5 26.0 - 34.0 pg   MCHC 34.1 30.0 - 36.0 g/dL   RDW 08.613.5 57.811.5 - 46.915.5 %   Platelets 344 150 - 400 K/uL  Urinalysis, Routine w reflex microscopic  Result Value Ref Range   Color, Urine YELLOW YELLOW   APPearance CLEAR CLEAR   Specific Gravity, Urine 1.010 1.005 - 1.030   pH 7.5 5.0 - 8.0   Glucose, UA NEGATIVE NEGATIVE mg/dL   Hgb urine dipstick NEGATIVE NEGATIVE   Bilirubin Urine NEGATIVE NEGATIVE   Ketones, ur NEGATIVE NEGATIVE mg/dL   Protein, ur NEGATIVE NEGATIVE mg/dL   Nitrite NEGATIVE NEGATIVE   Leukocytes, UA NEGATIVE NEGATIVE    Dg Chest 2 View Result Date: 11/22/2015 CLINICAL DATA:  Abdominal pain with nausea and vomiting EXAM: CHEST  2 VIEW COMPARISON:  08/26/2013 FINDINGS: Subtle hazy density at the peripheral left base is likely from low volumes and soft tissue attenuation. No convincing pneumonia. No edema, effusion, or pneumothorax. Normal heart size and mediastinal contours. IMPRESSION: Limited low volume chest without definite active disease. There is subtle density at the left base, which will be again visualized on pending abdominal CT Electronically Signed   By: Marnee SpringJonathon  Watts M.D.   On: 11/22/2015 16:46   Ct Abdomen Pelvis W Contrast Result Date: 11/22/2015 CLINICAL DATA:  Abdominal pain with nausea and vomiting EXAM: CT ABDOMEN AND PELVIS WITH CONTRAST TECHNIQUE: Multidetector CT imaging of the abdomen and pelvis was performed using the standard protocol following bolus administration of intravenous contrast. CONTRAST:  100 cc Isovue 300 intravenous COMPARISON:  03/16/2015 FINDINGS: Lower chest: Diffuse atherosclerotic calcification in the coronaries, age advanced. Mild basilar atelectasis.  Finding at the left base on previous chest x-ray was artifactual. Hepatobiliary: No focal liver abnormality.No evidence of biliary obstruction or stone. Pancreas: Unremarkable. Spleen: Unremarkable. Adrenals/Urinary Tract: Negative adrenals. No hydronephrosis or stone. Lobulated bilateral kidneys, likely diffuse mild scarring. Unremarkable bladder for degree of distention. No perivesicular stranding. Stomach/Bowel: No obstruction. No appendicitis. Moderate stool volume. Vascular/Lymphatic: Diffuse aortic atherosclerosis. No acute vascular abnormality. No mass or adenopathy. Reproductive:No pathologic findings. Other: No ascites or pneumoperitoneum. Musculoskeletal: Long thoracolumbar fusion without acute finding. IMPRESSION: 1. No acute finding. 2. Extensive coronary atherosclerosis. Electronically Signed   By: Marnee SpringJonathon  Watts M.D.   On: 11/22/2015 18:41    2010:  Pt has tol PO well while in the ED without N/V.  No stooling while in the ED.  Abd benign, VSS. Feels better and wants to go home now. Tx symptomatically at this time. Dx and testing d/w pt and family.  Questions answered.  Verb understanding, agreeable to d/c home with outpt f/u.    Final Clinical Impressions(s) / ED Diagnoses   Final diagnoses:  None    New Prescriptions New Prescriptions   No medications on file     Samuel JesterKathleen Cally Nygard, DO 11/24/15 2004

## 2015-11-22 NOTE — ED Notes (Signed)
Pt tolerating oral fluids 

## 2015-12-10 ENCOUNTER — Encounter: Payer: Self-pay | Admitting: Family Medicine

## 2015-12-10 ENCOUNTER — Ambulatory Visit (INDEPENDENT_AMBULATORY_CARE_PROVIDER_SITE_OTHER): Payer: BLUE CROSS/BLUE SHIELD | Admitting: Family Medicine

## 2015-12-10 VITALS — BP 91/57 | HR 67 | Temp 98.2°F | Ht 61.0 in | Wt 148.0 lb

## 2015-12-10 DIAGNOSIS — J40 Bronchitis, not specified as acute or chronic: Secondary | ICD-10-CM | POA: Diagnosis not present

## 2015-12-10 DIAGNOSIS — J329 Chronic sinusitis, unspecified: Secondary | ICD-10-CM | POA: Diagnosis not present

## 2015-12-10 MED ORDER — BETAMETHASONE SOD PHOS & ACET 6 (3-3) MG/ML IJ SUSP
6.0000 mg | Freq: Once | INTRAMUSCULAR | Status: AC
  Administered 2015-12-10: 6 mg via INTRAMUSCULAR

## 2015-12-10 MED ORDER — AMOXICILLIN-POT CLAVULANATE 875-125 MG PO TABS: 1.0000 | ORAL_TABLET | Freq: Two times a day (BID) | ORAL | 0 refills | Status: DC

## 2015-12-10 NOTE — Progress Notes (Signed)
Subjective:  Patient ID: Amy Merritt, female    DOB: [birth year]  Age: 55 y.o. MRN: [id number]  CC: Sore Throat   HPI Amy Merritt presents for Patient presents with upper respiratory congestion. Rhinorrhea that is frequently purulent. There is moderate sore throat. Patient reports coughing frequently as well.Scant sputum noted. There is low grade - 99.2 fever this AM. No chills no sweats. The patient denies being short of breath. Onset was 2days ago. Gradually worsening. Recnt trip to E.D. For dehydration from gastoenteritis. Sx resolved about a week before onset of current sx. Currently under eval by rheumatology for joint pains. Blood work was negative. Planning repeat in 3 mos.    History Amy Merritt has a past medical history of Arthritis; Chronic kidney disease; Depression; DJD (degenerative joint disease); DJD (degenerative joint disease) of cervical spine; DJD (degenerative joint disease), lumbar; Headache(784.0); Hypertension; IBS (irritable bowel syndrome); Kidney stone; Lymphocytic colitis; and PONV (postoperative nausea and vomiting).   She has a past surgical history that includes Lumbar disc surgery; Tubal ligation; lithotrpsy; Cervical fusion; Colonoscopy (02/11/2003); Cardiac catheterization (N/A, 06/15/2015); and DG MYLEOGRAM LUMBAR SPINE ([facility] HX).   Her family history includes COPD in her paternal grandfather; Cancer in her father and maternal grandfather; Colitis in her father; Diabetes in her father; Heart attack in her mother; Heart attack (age of onset: 4450) in her father; Heart disease in her paternal grandmother; Heart failure in her maternal grandmother and mother; Hypertension in her brother, father, and mother.She reports that she quit smoking about 5 years ago. She has never used smokeless tobacco. She reports that she does not drink alcohol or use drugs.    ROS Review of Systems  Constitutional: Negative for activity change, appetite change, chills and fever.  HENT:  Positive for congestion, ear pain, postnasal drip and sinus pressure. Negative for ear discharge, hearing loss, nosebleeds, rhinorrhea, sneezing and trouble swallowing.   Respiratory: Positive for chest tightness. Negative for shortness of breath.   Cardiovascular: Negative for chest pain and palpitations.  Skin: Negative for rash.    Objective:  BP (!) 91/57   Pulse 67   Temp 98.2 F (36.8 C) (Oral)   Ht 5\' 1"  (1.549 m)   Wt 148 lb (67.1 kg)   BMI 27.96 kg/m   BP Readings from Last 3 Encounters:  12/10/15 (!) 91/57  11/22/15 145/60  11/16/15 (P) 119/65    Wt Readings from Last 3 Encounters:  12/10/15 148 lb (67.1 kg)  11/22/15 149 lb (67.6 kg)  11/09/15 149 lb 2 oz (67.6 kg)     Physical Exam  Constitutional: She appears well-developed and well-nourished.  HENT:  Head: Normocephalic and atraumatic.  Right Ear: Tympanic membrane and external ear normal. No decreased hearing is noted.  Left Ear: Tympanic membrane and external ear normal. No decreased hearing is noted.  Nose: Mucosal edema present. Right sinus exhibits no frontal sinus tenderness. Left sinus exhibits no frontal sinus tenderness.  Mouth/Throat: No oropharyngeal exudate or posterior oropharyngeal erythema.  Eyes: EOM are normal. Pupils are equal, round, and reactive to light. Right eye exhibits no discharge. Left eye exhibits no discharge. No scleral icterus.  Neck: Normal range of motion. Neck supple. No Brudzinski's sign noted.  Pulmonary/Chest: Effort normal. No respiratory distress.  Moderate bronchoalveolar changes   Abdominal: Soft. There is no tenderness.  Lymphadenopathy:       Head (right side): No preauricular adenopathy present.       Head (left side): No preauricular adenopathy present.  She has no cervical adenopathy.       Right cervical: No superficial cervical adenopathy present.      Left cervical: No superficial cervical adenopathy present.  Skin: Skin is warm and dry. No rash noted.       Lab Results  Component Value Date   WBC 11.1 (H) 11/22/2015   HGB 13.9 11/22/2015   HCT 40.8 11/22/2015   PLT 344 11/22/2015   GLUCOSE 109 (H) 11/22/2015   CHOL 233 (H) 10/28/2015   TRIG 139 10/28/2015   HDL 64 10/28/2015   LDLCALC 141 (H) 10/28/2015   ALT 46 11/22/2015   AST 37 11/22/2015   NA 134 (L) 11/22/2015   K 3.6 11/22/2015   CL 96 (L) 11/22/2015   CREATININE 0.84 11/22/2015   BUN 7 11/22/2015   CO2 29 11/22/2015   TSH 1.600 10/28/2015   INR 0.94 06/12/2015    Dg Chest 2 View  Result Date: 11/22/2015 CLINICAL DATA:  Abdominal pain with nausea and vomiting EXAM: CHEST  2 VIEW COMPARISON:  08/26/2013 FINDINGS: Subtle hazy density at the peripheral left base is likely from low volumes and soft tissue attenuation. No convincing pneumonia. No edema, effusion, or pneumothorax. Normal heart size and mediastinal contours. IMPRESSION: Limited low volume chest without definite active disease. There is subtle density at the left base, which will be again visualized on pending abdominal CT Electronically Signed   By: Marnee SpringJonathon  Watts M.D.   On: 11/22/2015 16:46   Ct Abdomen Pelvis W Contrast  Result Date: 11/22/2015 CLINICAL DATA:  Abdominal pain with nausea and vomiting EXAM: CT ABDOMEN AND PELVIS WITH CONTRAST TECHNIQUE: Multidetector CT imaging of the abdomen and pelvis was performed using the standard protocol following bolus administration of intravenous contrast. CONTRAST:  100 cc Isovue 300 intravenous COMPARISON:  03/16/2015 FINDINGS: Lower chest: Diffuse atherosclerotic calcification in the coronaries, age advanced. Mild basilar atelectasis. Finding at the left base on previous chest x-ray was artifactual. Hepatobiliary: No focal liver abnormality.No evidence of biliary obstruction or stone. Pancreas: Unremarkable. Spleen: Unremarkable. Adrenals/Urinary Tract: Negative adrenals. No hydronephrosis or stone. Lobulated bilateral kidneys, likely diffuse mild scarring.  Unremarkable bladder for degree of distention. No perivesicular stranding. Stomach/Bowel: No obstruction. No appendicitis. Moderate stool volume. Vascular/Lymphatic: Diffuse aortic atherosclerosis. No acute vascular abnormality. No mass or adenopathy. Reproductive:No pathologic findings. Other: No ascites or pneumoperitoneum. Musculoskeletal: Long thoracolumbar fusion without acute finding. IMPRESSION: 1. No acute finding. 2. Extensive coronary atherosclerosis. Electronically Signed   By: Marnee SpringJonathon  Watts M.D.   On: 11/22/2015 18:41    Assessment & Plan:   Amy Merritt was seen today for sore throat.  Diagnoses and all orders for this visit:  Sinobronchitis -     betamethasone acetate-betamethasone sodium phosphate (CELESTONE) injection 6 mg; Inject 1 mL (6 mg total) into the muscle once.  Other orders -     amoxicillin-clavulanate (AUGMENTIN) 875-125 MG tablet; Take 1 tablet by mouth 2 (two) times daily. Take all of this medication   I have discontinued Ms. Shadoan's celecoxib and amoxicillin-clavulanate. I am also having her start on amoxicillin-clavulanate. Additionally, I am having her maintain her gabapentin, oxycodone, Glucosamine-Chondroitin (GLUCOSAMINE CHONDR COMPLEX PO), multivitamin, ADDERALL XR, methocarbamol, [employer] OMEGA-3 KRILL OIL, nitroGLYCERIN, acetaminophen, Aspirin-Acetaminophen-Caffeine (GOODY HEADACHE PO), Artificial Tear Ointment (DRY EYES OP), atorvastatin, aspirin, conjugated estrogens, FLUoxetine, ALPRAZolam, fluocinolone, lisinopril, Vitamin D (Ergocalciferol), pantoprazole, ondansetron, promethazine, and naproxen. We will continue to administer betamethasone acetate-betamethasone sodium phosphate.  Meds ordered this encounter  Medications  . naproxen (NAPROSYN) 500 MG tablet  Sig: TK 1 T PO  BID    Refill:  3  . amoxicillin-clavulanate (AUGMENTIN) 875-125 MG tablet    Sig: Take 1 tablet by mouth 2 (two) times daily. Take all of this medication    Dispense:  20 tablet     Refill:  0  . betamethasone acetate-betamethasone sodium phosphate (CELESTONE) injection 6 mg     Follow-up: No Follow-up on file.  Mechele Claude, M.D.

## 2015-12-14 ENCOUNTER — Telehealth: Payer: Self-pay | Admitting: Family Medicine

## 2015-12-14 NOTE — Telephone Encounter (Signed)
Notes faxed today

## 2015-12-15 NOTE — Telephone Encounter (Signed)
Informed patient she needs to get records from the provider she saw. We can't forward other drs notes.

## 2016-01-04 ENCOUNTER — Ambulatory Visit: Payer: BLUE CROSS/BLUE SHIELD | Admitting: Physical Therapy

## 2016-01-05 NOTE — Therapy (Signed)
[facility] [address] [place], [place], [address] Phone: [phone]   Fax:  [phone]  Patient Details  Name: Amy Merritt MRN: [id number] Date of Birth: [birth year] Referring Provider:  Patricia Nettleohen, Max W, MD  Encounter Date: 01/04/2016  The patient states her order was to be for low back pain.  She is going to call her MD office to have this order sent to our clinic at whicg time we will proceed with PT. Ernesto Zukowski, [place] MPT 01/05/2016, 8:59 AM  [facility] [address] [place], [place], [address] Phone: [phone]   Fax:  [phone]

## 2016-01-06 ENCOUNTER — Ambulatory Visit: Payer: BLUE CROSS/BLUE SHIELD

## 2016-01-06 ENCOUNTER — Other Ambulatory Visit: Payer: Self-pay | Admitting: *Deleted

## 2016-01-06 MED ORDER — ASPIRIN 81 MG PO TBEC: DELAYED_RELEASE_TABLET | ORAL | 0 refills | Status: AC

## 2016-01-11 ENCOUNTER — Ambulatory Visit: Payer: BLUE CROSS/BLUE SHIELD | Attending: Orthopaedic Surgery | Admitting: Physical Therapy

## 2016-01-11 DIAGNOSIS — M545 Low back pain: Secondary | ICD-10-CM | POA: Insufficient documentation

## 2016-01-11 DIAGNOSIS — R262 Difficulty in walking, not elsewhere classified: Secondary | ICD-10-CM | POA: Insufficient documentation

## 2016-01-11 DIAGNOSIS — G8929 Other chronic pain: Secondary | ICD-10-CM | POA: Insufficient documentation

## 2016-01-11 DIAGNOSIS — M6281 Muscle weakness (generalized): Secondary | ICD-10-CM | POA: Diagnosis present

## 2016-01-11 NOTE — Therapy (Signed)
[facility] [address] [place], [place], [address] Phone: [phone]   Fax:  [phone]  Physical Therapy Evaluation  Patient Details  Name: Amy SchlatterRenee V Merritt MRN: [id number] Date of Birth: 01-03-1961 Referring Provider: Sharolyn DouglasMax Cohen MD  Encounter Date: 01/11/2016      PT End of Session - 01/11/16 1149    Activity Tolerance Patient tolerated treatment well   Behavior During Therapy [facility] for tasks assessed/performed      Past Medical History:  Diagnosis Date  . Arthritis   . Chronic kidney disease   . Depression   . DJD (degenerative joint disease)   . DJD (degenerative joint disease) of cervical spine   . DJD (degenerative joint disease), lumbar   . Headache(784.0)   . Hypertension   . IBS (irritable bowel syndrome)   . Kidney stone   . Lymphocytic colitis   . PONV (postoperative nausea and vomiting)     Past Surgical History:  Procedure Laterality Date  . CARDIAC CATHETERIZATION N/A 06/15/2015   Procedure: Left Heart Cath and Coronary Angiography;  Surgeon: Kathleene Hazelhristopher D McAlhany, MD;  [facility];  Service: Cardiovascular;  Laterality: N/A;  . CERVICAL FUSION     x 2  . COLONOSCOPY  02/11/2003  . DG MYLEOGRAM LUMBAR SPINE ([facility] HX)    . lithotrpsy    . LUMBAR DISC SURGERY     x 4- lower back with rods and screws  . TUBAL LIGATION      There were no vitals filed for this visit.       Subjective Assessment - 01/11/16 1133    Diagnostic tests C6-7 interbody fusion., C4-6 ACDF, screw loose at C6            [facility] PT Assessment - 01/11/16 0001      Assessment   Medical Diagnosis cervicalgia, dorsalgia   Referring Provider Sharolyn DouglasMax Cohen MD     Restrictions   Weight Bearing Restrictions No     Balance Screen   Has the patient fallen in the past 6 months Yes   How many times? 1  on 01/06/16     Posture/Postural Control   Posture Comments pt stands in trunk flexion and Rt lateral flexion     ROM / Strength   AROM / PROM / Strength AROM;Strength     AROM   AROM Assessment Site Lumbar   Lumbar Flexion limited 75%   Lumbar Extension limited 50%   Lumbar - Right Side Bend limited 50%   Lumbar - Left Side Bend unable to get to neutral  pain   Lumbar - Right Rotation limited 25%   Lumbar - Left Rotation limited 25%  pain     Strength   Overall Strength Comments hip strength 3/5 bilat, unable to tolerate pressure     Palpation   Palpation comment very tender to palpation spinous processes and tranverse processes L1-L5 and lumbar paraspinals     Special Tests    Special Tests --  SLR and FABER negative bilat                   [facility] Adult PT Treatment/Exercise - 01/11/16 0001      Exercises   Exercises Lumbar     Lumbar Exercises: Stretches   Lower Trunk Rotation 60 seconds     Lumbar Exercises: Supine   Ab Set 5 reps;5 seconds  with tactile cues, cues to breath   Bridge 5 reps  Modalities   Modalities --  pt states she will use TENS and heat at home                PT Education - 01/11/16 1148    Education provided Yes   Education Details PT POC, HEP   Person(s) Educated Patient   Methods Explanation;Demonstration;Handout   Comprehension Returned demonstration;Verbalized understanding             PT Long Term Goals - 01/11/16 1158      PT LONG TERM GOAL #1   Title Pt will be independent with HEP   Time 6   Period Weeks   Status New     PT LONG TERM GOAL #2   Title Pt will stand x 10 minutes with no increase in symptoms   Time 6   Period Weeks   Status New     PT LONG TERM GOAL #3   Title Pt will improve bilat hip strength to 4/5 to improve ability to stand and walk without pain   Time 6   Period Weeks   Status New     PT LONG TERM GOAL #4   Title Pt will improve lumbar mobility by 25% in all directions to decrease pain and improve mobility   Time 6   Period Weeks   Status New               Plan -  01/11/16 1156    Rehab Potential Good   PT Frequency 2x / week   PT Duration 6 weeks   PT Treatment/Interventions ADLs/Self Care Home Management;Electrical Stimulation;Moist Heat;Cryotherapy;Iontophoresis 4mg /ml Dexamethasone;Gait training;Therapeutic activities;Patient/family education;Functional mobility training;Manual techniques;Dry needling;Taping;Balance training;Therapeutic exercise;Neuromuscular re-education;Passive range of motion   PT Next Visit Plan assess cervical spine   PT Home Exercise Plan SLR all directions, ab set, bridge, LTR   Consulted and Agree with Plan of Care Patient      Patient will benefit from skilled therapeutic intervention in order to improve the following deficits and impairments:  Pain, Decreased activity tolerance, Decreased strength, Impaired flexibility, Hypomobility, Decreased endurance, Decreased range of motion, Abnormal gait, Decreased balance  Visit Diagnosis: Muscle weakness (generalized) - Plan: PT plan of care cert/re-cert  Chronic midline low back pain without sciatica - Plan: PT plan of care cert/re-cert  Difficulty in walking, not elsewhere classified - Plan: PT plan of care cert/re-cert     Problem List Patient Active Problem List   Diagnosis Date Noted  . Coronary artery disease involving native coronary artery of native heart with angina pectoris (HCC)   . Frequency of urination 08/20/2012  . Hypokalemia 08/04/2012  . Anemia 08/04/2012  . HTN (hypertension) 05/21/2012  . Back pain, chronic 05/21/2012  . ADD (attention deficit disorder) 05/21/2012  . HLD (hyperlipidemia) 05/21/2012  . Depression 05/21/2012  . Insomnia 05/21/2012    [name], PT, DPT 01/11/2016, 12:02 PM  [facility] [address] [place], [place], [address] Phone: [phone]   Fax:  [phone]  Name: Amy SchlatterRenee V Scobey MRN: [id number] Date of Birth: 1960/07/23

## 2016-01-13 ENCOUNTER — Encounter: Payer: BLUE CROSS/BLUE SHIELD | Admitting: Physical Therapy

## 2016-01-13 ENCOUNTER — Ambulatory Visit: Payer: BLUE CROSS/BLUE SHIELD | Admitting: Physical Therapy

## 2016-01-14 ENCOUNTER — Ambulatory Visit: Payer: BLUE CROSS/BLUE SHIELD | Admitting: Physical Therapy

## 2016-01-14 ENCOUNTER — Encounter: Payer: Self-pay | Admitting: Physical Therapy

## 2016-01-14 DIAGNOSIS — M545 Low back pain: Secondary | ICD-10-CM

## 2016-01-14 DIAGNOSIS — R262 Difficulty in walking, not elsewhere classified: Secondary | ICD-10-CM

## 2016-01-14 DIAGNOSIS — G8929 Other chronic pain: Secondary | ICD-10-CM

## 2016-01-14 DIAGNOSIS — M6281 Muscle weakness (generalized): Secondary | ICD-10-CM | POA: Diagnosis not present

## 2016-01-14 NOTE — Therapy (Signed)
[facility] [address] [place], [place], [address] Phone: [phone]   Fax:  [phone]  Physical Therapy Treatment  Patient Details  Name: LENEA BYWATER MRN: [id number] Date of Birth: [birth year] Referring Provider: Sharolyn Douglas MD  Encounter Date: 01/14/2016      PT End of Session - 01/14/16 6962    Visit Number 2   Number of Visits 12   Date for PT Re-Evaluation 02/22/16   PT Start Time 0823   PT Stop Time 0850  2 units secondary to no modalities and treatment limitations   PT Time Calculation (min) 27 min   Activity Tolerance Patient tolerated treatment well   Behavior During Therapy [facility] for tasks assessed/performed      Past Medical History:  Diagnosis Date  . Arthritis   . Chronic kidney disease   . Depression   . DJD (degenerative joint disease)   . DJD (degenerative joint disease) of cervical spine   . DJD (degenerative joint disease), lumbar   . Headache(784.0)   . Hypertension   . IBS (irritable bowel syndrome)   . Kidney stone   . Lymphocytic colitis   . PONV (postoperative nausea and vomiting)     Past Surgical History:  Procedure Laterality Date  . CARDIAC CATHETERIZATION N/A 06/15/2015   Procedure: Left Heart Cath and Coronary Angiography;  Surgeon: Kathleene Hazel, MD;  [facility] INVAS[facility] CV LAB;  Service: Cardiovascular;  Laterality: N/A;  . CERVICAL FUSION     x 2  . COLONOSCOPY  02/11/2003  . DG MYLEOGRAM LUMBAR SPINE ([facility] HX)    . lithotrpsy    . LUMBAR DISC SURGERY     x 4- lower back with rods and screws  . TUBAL LIGATION      There were no vitals filed for this visit.      Subjective Assessment - 01/14/16 0820    Subjective Reports falling last night flat out on stomach with scratches on hands and knees and hurt her L knee and thinks she may have some bruising along L rib area.   Pertinent History cx surgery x 2   Limitations Standing   How long can you stand comfortably? 5  minutes   Diagnostic tests C6-7 interbody fusion., C4-6 ACDF, screw loose at C6   Patient Stated Goals prepare for surgery   Currently in Pain? Yes   Pain Score 7    Pain Location Knee   Pain Orientation Left   Pain Descriptors / Indicators Discomfort   Pain Type Acute pain   Pain Onset Yesterday   Pain Score 10   Pain Location Neck  with turning to L following fall   Pain Descriptors / Indicators Sharp   Pain Type Acute pain   Pain Onset Yesterday            [facility] PT Assessment - 01/14/16 0001      Assessment   Medical Diagnosis cervicalgia, dorsalgia   Next MD Visit "Supposed to set up surgery"     Restrictions   Weight Bearing Restrictions No                     [facility] Adult PT Treatment/Exercise - 01/14/16 0001      Lumbar Exercises: Stretches   Single Knee to Chest Stretch 1 rep;10 seconds  RLE but painful due to superior HS pain     Lumbar Exercises: Supine   Ab Set 20 reps;5 seconds   Glut Set 20  reps;5 seconds   Bent Knee Raise 15 reps;10 reps  10 reps LLE, 15 reps RLE due to pain   Straight Leg Raise 10 reps  painful with BLE but especially RLE   Other Supine Lumbar Exercises Gray ball to lap x20 reps                     PT Long Term Goals - 01/11/16 1158      PT LONG TERM GOAL #1   Title Pt will be independent with HEP   Time 6   Period Weeks   Status New     PT LONG TERM GOAL #2   Title Pt will stand x 10 minutes with no increase in symptoms   Time 6   Period Weeks   Status New     PT LONG TERM GOAL #3   Title Pt will improve bilat hip strength to 4/5 to improve ability to stand and walk without pain   Time 6   Period Weeks   Status New     PT LONG TERM GOAL #4   Title Pt will improve lumbar mobility by 25% in all directions to decrease pain and improve mobility   Time 6   Period Weeks   Status New               Plan - 01/14/16 0902    Clinical Impression Statement Patient presents in clinic with  general overall soreness from a fall at her home but patient reports soreness especially along the left side of her body. Patient limited in regards to exercises and stretches today secondary to L knee pain as well as overall soreness. Patient able to complete gentle exercises with VCs to complete as much as possible. Patient educated to continue HEP as she could secondary to body soreness and to continue modalities use at home until next treatment. Patient did not have modalities today as she has TENS unit at home.    Rehab Potential Good   PT Frequency 2x / week   PT Duration 6 weeks   PT Treatment/Interventions ADLs/Self Care Home Management;Electrical Stimulation;Moist Heat;Cryotherapy;Iontophoresis 4mg /ml Dexamethasone;Gait training;Therapeutic activities;Patient/family education;Functional mobility training;Manual techniques;Dry needling;Taping;Balance training;Therapeutic exercise;Neuromuscular re-education;Passive range of motion   PT Next Visit Plan Continue with MPT POC as patient able to tolerate.   PT Home Exercise Plan SLR all directions, ab set, bridge, LTR   Consulted and Agree with Plan of Care Patient      Patient will benefit from skilled therapeutic intervention in order to improve the following deficits and impairments:  Pain, Decreased activity tolerance, Decreased strength, Impaired flexibility, Hypomobility, Decreased endurance, Decreased range of motion, Abnormal gait, Decreased balance  Visit Diagnosis: Muscle weakness (generalized)  Chronic midline low back pain without sciatica  Difficulty in walking, not elsewhere classified     Problem List Patient Active Problem List   Diagnosis Date Noted  . Coronary artery disease involving native coronary artery of native heart with angina pectoris (HCC)   . Frequency of urination 08/20/2012  . Hypokalemia 08/04/2012  . Anemia 08/04/2012  . HTN (hypertension) 05/21/2012  . Back pain, chronic 05/21/2012  . ADD  (attention deficit disorder) 05/21/2012  . HLD (hyperlipidemia) 05/21/2012  . Depression 05/21/2012  . Insomnia 05/21/2012    Evelene CroonKelsey M Parsons, PTA 01/14/2016, 9:08 AM  [facility] [address] [place], [place], [address] Phone: [phone]   Fax:  [phone]  Name: Rosezetta SchlatterRenee V Coulthard MRN: [id number] Date of  Birth: [birth year]

## 2016-01-18 ENCOUNTER — Ambulatory Visit: Payer: BLUE CROSS/BLUE SHIELD | Admitting: Physical Therapy

## 2016-01-18 ENCOUNTER — Encounter: Payer: Self-pay | Admitting: Physical Therapy

## 2016-01-18 DIAGNOSIS — G8929 Other chronic pain: Secondary | ICD-10-CM

## 2016-01-18 DIAGNOSIS — M6281 Muscle weakness (generalized): Secondary | ICD-10-CM

## 2016-01-18 DIAGNOSIS — R262 Difficulty in walking, not elsewhere classified: Secondary | ICD-10-CM

## 2016-01-18 DIAGNOSIS — M545 Low back pain: Secondary | ICD-10-CM

## 2016-01-18 NOTE — Therapy (Addendum)
[facility] [place], [place], [address] Phone: [phone]   Fax:  [phone]  Physical Therapy Treatment  Patient Details  Name: Amy Merritt MRN: [id number] Date of Birth: 1960/08/21 Referring Provider: Rennis Harding MD  Encounter Date: 01/18/2016      PT End of Session - 01/18/16 1120    Visit Number 3   Number of Visits 12   Date for PT Re-Evaluation 02/22/16   PT Start Time 1121   PT Stop Time 1203   PT Time Calculation (min) 42 min   Activity Tolerance Patient tolerated treatment well;Patient limited by pain   Behavior During Therapy [facility] for tasks assessed/performed      Past Medical History:  Diagnosis Date  . Arthritis   . Chronic kidney disease   . Depression   . DJD (degenerative joint disease)   . DJD (degenerative joint disease) of cervical spine   . DJD (degenerative joint disease), lumbar   . Headache(784.0)   . Hypertension   . IBS (irritable bowel syndrome)   . Kidney stone   . Lymphocytic colitis   . PONV (postoperative nausea and vomiting)     Past Surgical History:  Procedure Laterality Date  . CARDIAC CATHETERIZATION N/A 06/15/2015   Procedure: Left Heart Cath and Coronary Angiography;  Surgeon: Burnell Blanks, MD;  Location: [employer] [facility] CV LAB;  Service: Cardiovascular;  Laterality: N/A;  . CERVICAL FUSION     x 2  . COLONOSCOPY  02/11/2003  . DG MYLEOGRAM LUMBAR SPINE ([place] HX)    . lithotrpsy    . LUMBAR DISC SURGERY     x 4- lower back with rods and screws  . TUBAL LIGATION      There were no vitals filed for this visit.      Subjective Assessment - 01/18/16 1116    Subjective Reports that she thinks she is hurting worse and thinks it may be from a kidney stone. Reports that she experienced R knee buckling at times and was able to catch herself. Reports that she is still sore around her ribcage. Reports she has had some pain above where the rods are in mid back.   Pertinent History cx surgery x 2   Limitations Standing   How long can you stand comfortably? 5 minutes   Diagnostic tests C6-7 interbody fusion., C4-6 ACDF, screw loose at C6   Patient Stated Goals prepare for surgery            [facility] PT Assessment - 01/18/16 0001      Assessment   Medical Diagnosis cervicalgia, dorsalgia   Next MD Visit "Supposed to set up surgery"     Restrictions   Weight Bearing Restrictions No                     [facility] Adult PT Treatment/Exercise - 01/18/16 0001      Lumbar Exercises: Supine   Ab Set 5 seconds  x25 reps   Glut Set 20 reps;5 seconds   Clam 20 reps   Bent Knee Raise 20 reps   Bent Knee Raise Limitations Pain down to R heel with RLE; pain with LLE although not to intensity of RLE   Bridge 15 reps;5 seconds   Straight Leg Raise 20 reps;3 seconds   Other Supine Lumbar Exercises 2# ball to lap with core activation x20 reps   Other Supine Lumbar Exercises 2# D1/D2 with core activation x20 reps  PT Long Term Goals - 01/11/16 1158      PT LONG TERM GOAL #1   Title Pt will be independent with HEP   Time 6   Period Weeks   Status New     PT LONG TERM GOAL #2   Title Pt will stand x 10 minutes with no increase in symptoms   Time 6   Period Weeks   Status New     PT LONG TERM GOAL #3   Title Pt will improve bilat hip strength to 4/5 to improve ability to stand and walk without pain   Time 6   Period Weeks   Status New     PT LONG TERM GOAL #4   Title Pt will improve lumbar mobility by 25% in all directions to decrease pain and improve mobility   Time 6   Period Weeks   Status New               Plan - 01/18/16 1204    Clinical Impression Statement Patient presents in clinic with continued overall soreness and discomfort. Patient able to complete exercises as directed with moderate multimodal cueing for technique and parameters. Patient was introduced to many new exercises to  which she responded fairly well. Patient did experience low back discomfort over kidney region with seated shoulder extension with core activation per patient report. Patient educated to continue HEP as she was provided before and progression may occur following MD visits this week with urologist and pain management.    Rehab Potential Good   PT Frequency 2x / week   PT Duration 6 weeks   PT Treatment/Interventions ADLs/Self Care Home Management;Electrical Stimulation;Moist Heat;Cryotherapy;Iontophoresis '4mg'$ /ml Dexamethasone;Gait training;Therapeutic activities;Patient/family education;Functional mobility training;Manual techniques;Dry needling;Taping;Balance training;Therapeutic exercise;Neuromuscular re-education;Passive range of motion   PT Next Visit Plan Continue with MPT POC as patient able to tolerate.   PT Home Exercise Plan SLR all directions, ab set, bridge, LTR   Consulted and Agree with Plan of Care Patient      Patient will benefit from skilled therapeutic intervention in order to improve the following deficits and impairments:  Pain, Decreased activity tolerance, Decreased strength, Impaired flexibility, Hypomobility, Decreased endurance, Decreased range of motion, Abnormal gait, Decreased balance  Visit Diagnosis: Muscle weakness (generalized)  Chronic midline low back pain without sciatica  Difficulty in walking, not elsewhere classified     Problem List Patient Active Problem List   Diagnosis Date Noted  . Coronary artery disease involving native coronary artery of native heart with angina pectoris ([place])   . Frequency of urination 08/20/2012  . Hypokalemia 08/04/2012  . Anemia 08/04/2012  . HTN (hypertension) 05/21/2012  . Back pain, chronic 05/21/2012  . ADD (attention deficit disorder) 05/21/2012  . HLD (hyperlipidemia) 05/21/2012  . Depression 05/21/2012  . Insomnia 05/21/2012    Wynelle Fanny, PTA 01/18/2016, 12:11 PM  [facility] [address] [place], [place], [address] Phone: [phone]   Fax:  [phone]  Name: Amy Merritt MRN: [id number] Date of Birth: 12-11-1960  PHYSICAL THERAPY DISCHARGE SUMMARY  Visits from Start of Care: 3  Current functional level related to goals / functional outcomes: See above   Remaining deficits: See above   Education / Equipment: HEP Plan: Patient agrees to discharge.  Patient goals were not met. Patient is being discharged due to a change in medical status.  ?????Patient phoned 01/22/16 and reported that she is having neck surgery 01/26/16 and is cancelling all future rehab  appointments.          Madelyn Flavors, PT 01/22/16 11:26 AM [facility] [address] [place], [place], [address] Phone: [phone]   Fax:  [phone]

## 2016-01-20 ENCOUNTER — Other Ambulatory Visit: Payer: Self-pay | Admitting: Occupational Therapy

## 2016-01-20 DIAGNOSIS — M545 Low back pain: Secondary | ICD-10-CM

## 2016-01-20 DIAGNOSIS — N200 Calculus of kidney: Secondary | ICD-10-CM

## 2016-01-21 ENCOUNTER — Inpatient Hospital Stay: Admission: RE | Admit: 2016-01-21 | Payer: BLUE CROSS/BLUE SHIELD | Source: Ambulatory Visit

## 2016-01-22 ENCOUNTER — Ambulatory Visit: Payer: BLUE CROSS/BLUE SHIELD | Admitting: Physical Therapy

## 2016-01-25 ENCOUNTER — Ambulatory Visit: Payer: BLUE CROSS/BLUE SHIELD | Admitting: Physical Therapy

## 2016-01-27 ENCOUNTER — Ambulatory Visit: Payer: BLUE CROSS/BLUE SHIELD | Admitting: Physical Therapy

## 2016-01-28 ENCOUNTER — Encounter: Payer: BLUE CROSS/BLUE SHIELD | Admitting: Physical Therapy

## 2016-02-03 ENCOUNTER — Encounter: Payer: BLUE CROSS/BLUE SHIELD | Admitting: Physical Therapy

## 2016-02-05 ENCOUNTER — Encounter: Payer: BLUE CROSS/BLUE SHIELD | Admitting: Physical Therapy

## 2016-02-15 ENCOUNTER — Other Ambulatory Visit: Payer: Self-pay | Admitting: Obstetrics & Gynecology

## 2016-02-17 LAB — CYTOLOGY - PAP

## 2016-03-08 ENCOUNTER — Encounter: Payer: Self-pay | Admitting: Family Medicine

## 2016-03-08 ENCOUNTER — Ambulatory Visit (INDEPENDENT_AMBULATORY_CARE_PROVIDER_SITE_OTHER): Payer: 59 | Admitting: Family Medicine

## 2016-03-08 VITALS — BP 102/67 | HR 63 | Temp 96.8°F | Ht 61.0 in | Wt 142.0 lb

## 2016-03-08 DIAGNOSIS — I952 Hypotension due to drugs: Secondary | ICD-10-CM

## 2016-03-08 NOTE — Progress Notes (Signed)
Subjective:  Patient ID: Amy Merritt, female    DOB: 1960-08-07  Age: 56 y.o. MRN: [id number]  CC: Hypotension (pt here today c/o low BP while in the hospital having surgery and then yesterday following up with the surgeon her BP was 66/44)   HPI Amy Merritt presents for  follow-up of hypertension. Patient has no history of headache chest pain or shortness of breath or recent cough. Patient also denies symptoms of TIA such as numbness weakness lateralizing. Patient checks  blood pressure at home and at other MD offices. Several low readings noted.   History Amy Merritt has a past medical history of Arthritis; Chronic kidney disease; Depression; DJD (degenerative joint disease); DJD (degenerative joint disease) of cervical spine; DJD (degenerative joint disease), lumbar; Headache(784.0); Hypertension; IBS (irritable bowel syndrome); Kidney stone; Lymphocytic colitis; and PONV (postoperative nausea and vomiting).   She has a past surgical history that includes Lumbar disc surgery; Tubal ligation; lithotrpsy; Cervical fusion; Colonoscopy (02/11/2003); Cardiac catheterization (N/A, 06/15/2015); and DG MYLEOGRAM LUMBAR SPINE ([facility] HX).   Her family history includes COPD in her paternal grandfather; Cancer in her father and maternal grandfather; Colitis in her father; Diabetes in her father; Heart attack in her mother; Heart attack (age of onset: 8050) in her father; Heart disease in her paternal grandmother; Heart failure in her maternal grandmother and mother; Hypertension in her brother, father, and mother.She reports that she quit smoking about 5 years ago. She has never used smokeless tobacco. She reports that she does not drink alcohol or use drugs.    ROS Review of Systems  Constitutional: Negative for activity change, appetite change and fever.  HENT: Negative for congestion, rhinorrhea and sore throat.   Eyes: Negative for visual disturbance.  Respiratory: Negative for cough and shortness of  breath.   Cardiovascular: Negative for chest pain and palpitations.  Gastrointestinal: Negative for abdominal pain, diarrhea and nausea.  Genitourinary: Negative for dysuria.  Musculoskeletal: Negative for arthralgias and myalgias.    Objective:  BP 102/67   Pulse 63   Temp (!) 96.8 F (36 C) (Oral)   Ht 5\' 1"  (1.549 m)   Wt 142 lb (64.4 kg)   BMI 26.83 kg/m   BP Readings from Last 3 Encounters:  03/08/16 102/67  12/10/15 (!) 91/57  11/22/15 145/60    Wt Readings from Last 3 Encounters:  03/08/16 142 lb (64.4 kg)  12/10/15 148 lb (67.1 kg)  11/22/15 149 lb (67.6 kg)     Physical Exam  Constitutional: She is oriented to person, place, and time. She appears well-developed and well-nourished. No distress.  HENT:  Head: Normocephalic and atraumatic.  Right Ear: External ear normal.  Left Ear: External ear normal.  Nose: Nose normal.  Mouth/Throat: Oropharynx is clear and moist.  Eyes: Conjunctivae and EOM are normal. Pupils are equal, round, and reactive to light.  Neck: Normal range of motion. Neck supple. No thyromegaly present.  Cardiovascular: Normal rate, regular rhythm and normal heart sounds.   No murmur heard. Pulmonary/Chest: Effort normal and breath sounds normal. No respiratory distress. She has no wheezes. She has no rales.  Abdominal: Soft. Bowel sounds are normal. She exhibits no distension. There is no tenderness.  Lymphadenopathy:    She has no cervical adenopathy.  Neurological: She is alert and oriented to person, place, and time. She has normal reflexes.  Skin: Skin is warm and dry.  Psychiatric: She has a normal mood and affect. Her behavior is normal. Judgment and thought content  normal.    Dg Chest 2 View  Result Date: 11/22/2015 CLINICAL DATA:  Abdominal pain with nausea and vomiting EXAM: CHEST  2 VIEW COMPARISON:  08/26/2013 FINDINGS: Subtle hazy density at the peripheral left base is likely from low volumes and soft tissue attenuation. No  convincing pneumonia. No edema, effusion, or pneumothorax. Normal heart size and mediastinal contours. IMPRESSION: Limited low volume chest without definite active disease. There is subtle density at the left base, which will be again visualized on pending abdominal CT Electronically Signed   By: Marnee Spring M.D.   On: 11/22/2015 16:46   Ct Abdomen Pelvis W Contrast  Result Date: 11/22/2015 CLINICAL DATA:  Abdominal pain with nausea and vomiting EXAM: CT ABDOMEN AND PELVIS WITH CONTRAST TECHNIQUE: Multidetector CT imaging of the abdomen and pelvis was performed using the standard protocol following bolus administration of intravenous contrast. CONTRAST:  100 cc Isovue 300 intravenous COMPARISON:  03/16/2015 FINDINGS: Lower chest: Diffuse atherosclerotic calcification in the coronaries, age advanced. Mild basilar atelectasis. Finding at the left base on previous chest x-ray was artifactual. Hepatobiliary: No focal liver abnormality.No evidence of biliary obstruction or stone. Pancreas: Unremarkable. Spleen: Unremarkable. Adrenals/Urinary Tract: Negative adrenals. No hydronephrosis or stone. Lobulated bilateral kidneys, likely diffuse mild scarring. Unremarkable bladder for degree of distention. No perivesicular stranding. Stomach/Bowel: No obstruction. No appendicitis. Moderate stool volume. Vascular/Lymphatic: Diffuse aortic atherosclerosis. No acute vascular abnormality. No mass or adenopathy. Reproductive:No pathologic findings. Other: No ascites or pneumoperitoneum. Musculoskeletal: Long thoracolumbar fusion without acute finding. IMPRESSION: 1. No acute finding. 2. Extensive coronary atherosclerosis. Electronically Signed   By: Marnee Spring M.D.   On: 11/22/2015 18:41    Assessment & Plan:   Amy Merritt was seen today for hypotension.  Diagnoses and all orders for this visit:  Hypotension due to drugs    I have discontinued Amy Merritt's multivitamin, Aspirin-Acetaminophen-Caffeine (GOODY  HEADACHE PO), fluocinolone, lisinopril, Vitamin D (Ergocalciferol), and amoxicillin-clavulanate. I am also having her maintain her gabapentin, oxycodone, Glucosamine-Chondroitin (GLUCOSAMINE CHONDR COMPLEX PO), ADDERALL XR, methocarbamol, [employer] OMEGA-3 KRILL OIL, nitroGLYCERIN, acetaminophen, Artificial Tear Ointment (DRY EYES OP), atorvastatin, conjugated estrogens, FLUoxetine, ALPRAZolam, pantoprazole, ondansetron, promethazine, naproxen, and aspirin.  Allergies as of 03/08/2016      Reactions   Amoxicillin-pot Clavulanate Hives, Rash   Celecoxib Hives, Rash   Morphine And Related Itching, Swelling   Liquid only      Medication List       Accurate as of 03/08/16  9:00 PM. Always use your most recent med list.          acetaminophen 500 MG tablet Commonly known as:  TYLENOL Take 1,000 mg by mouth 3 (three) times daily as needed for headache.   ADDERALL XR 20 MG 24 hr capsule Generic drug:  amphetamine-dextroamphetamine Take 20 mg by mouth daily.   ALPRAZolam 1 MG tablet Commonly known as:  XANAX Take 1 tablet (1 mg total) by mouth 4 (four) times daily as needed. ANXIETY   aspirin 81 MG EC tablet TAKE 1 BY MOUTH DAILY   atorvastatin 40 MG tablet Commonly known as:  LIPITOR Take 1 tablet (40 mg total) by mouth daily.   conjugated estrogens vaginal cream Commonly known as:  PREMARIN Place 1 g vaginally at bedtime.   [employer] OMEGA-3 KRILL OIL 300 MG Caps Take 300 mg by mouth daily.   DRY EYES OP Place 1 drop into both eyes daily as needed (for dry eyes).   FLUoxetine 10 MG capsule Commonly known as:  PROZAC TAKE 60MG   BY MOUTH DAILY   gabapentin 400 MG capsule Commonly known as:  NEURONTIN Take 400 mg by mouth 3 (three) times daily.   GLUCOSAMINE CHONDR COMPLEX PO Take 1 tablet by mouth 2 (two) times daily.   methocarbamol 750 MG tablet Commonly known as:  ROBAXIN Take 750 mg by mouth every 6 (six) hours as needed for muscle spasms.   naproxen 500 MG tablet Commonly  known as:  NAPROSYN TK 1 T PO  BID   nitroGLYCERIN 0.4 MG SL tablet Commonly known as:  NITROSTAT Place 1 tablet (0.4 mg total) under the tongue every 5 (five) minutes as needed for chest pain.   ondansetron 4 MG disintegrating tablet Commonly known as:  ZOFRAN ODT Take 1 tablet (4 mg total) by mouth every 8 (eight) hours as needed for nausea or vomiting.   oxycodone 30 MG immediate release tablet Commonly known as:  ROXICODONE Take 30 mg by mouth every 4 (four) hours as needed for pain (Dr Thyra Breed).   pantoprazole 40 MG tablet Commonly known as:  PROTONIX Take 40 mg by mouth daily.   promethazine 25 MG suppository Commonly known as:  PHENERGAN Place 1 suppository (25 mg total) rectally every 6 (six) hours as needed for nausea or vomiting.      Discussed in detail with patient the need to consider decreasing the use of pain medication and tapering off of the benzodiazepine. Specifically these medicines contribute significantly to hypotension. Lisinopril was discontinued for now. She will monitor blood pressure over the next 2 weeks and decision regarding surgery will be made at that time.  Greater than 30 minutes was spent with the patient over half of which was spent in counseling and planning regarding hypertension and upcoming surgery planning.  Follow-up: Return in about 2 weeks (around 03/22/2016).  Mechele Claude, M.D.

## 2016-04-26 ENCOUNTER — Ambulatory Visit (INDEPENDENT_AMBULATORY_CARE_PROVIDER_SITE_OTHER): Payer: 59 | Admitting: Family Medicine

## 2016-04-26 ENCOUNTER — Ambulatory Visit: Payer: BLUE CROSS/BLUE SHIELD | Admitting: Family Medicine

## 2016-04-26 ENCOUNTER — Encounter: Payer: Self-pay | Admitting: Family Medicine

## 2016-04-26 VITALS — BP 132/78 | HR 58 | Temp 97.0°F | Ht 61.0 in | Wt 149.0 lb

## 2016-04-26 DIAGNOSIS — I10 Essential (primary) hypertension: Secondary | ICD-10-CM | POA: Diagnosis not present

## 2016-04-26 DIAGNOSIS — I952 Hypotension due to drugs: Secondary | ICD-10-CM | POA: Diagnosis not present

## 2016-04-26 DIAGNOSIS — D649 Anemia, unspecified: Secondary | ICD-10-CM

## 2016-04-26 LAB — CMP14+EGFR
A/G RATIO: 1.4 (ref 1.2–2.2)
ALT: 24 IU/L (ref 0–32)
AST: 30 IU/L (ref 0–40)
Albumin: 3.7 g/dL (ref 3.5–5.5)
Alkaline Phosphatase: 197 IU/L — ABNORMAL HIGH (ref 39–117)
BUN/Creatinine Ratio: 9 (ref 9–23)
BUN: 9 mg/dL (ref 6–24)
Bilirubin Total: 0.2 mg/dL (ref 0.0–1.2)
CALCIUM: 9.3 mg/dL (ref 8.7–10.2)
CO2: 28 mmol/L (ref 18–29)
CREATININE: 1.01 mg/dL — AB (ref 0.57–1.00)
Chloride: 98 mmol/L (ref 96–106)
GFR, EST AFRICAN AMERICAN: 73 mL/min/{1.73_m2} (ref >59)
GFR, EST NON AFRICAN AMERICAN: 64 mL/min/{1.73_m2} (ref >59)
Globulin, Total: 2.6 g/dL (ref 1.5–4.5)
Glucose: 87 mg/dL (ref 65–99)
POTASSIUM: 4.1 mmol/L (ref 3.5–5.2)
Sodium: 140 mmol/L (ref 134–144)
TOTAL PROTEIN: 6.3 g/dL (ref 6.0–8.5)

## 2016-04-26 LAB — CBC WITH DIFFERENTIAL/PLATELET
BASOS: 1 %
Basophils Absolute: 0 10*3/uL (ref 0.0–0.2)
EOS (ABSOLUTE): 0.3 10*3/uL (ref 0.0–0.4)
Eos: 6 %
Hematocrit: 34.5 % (ref 34.0–46.6)
Hemoglobin: 10.8 g/dL — ABNORMAL LOW (ref 11.1–15.9)
IMMATURE GRANS (ABS): 0 10*3/uL (ref 0.0–0.1)
IMMATURE GRANULOCYTES: 0 %
LYMPHS: 30 %
Lymphocytes Absolute: 1.8 10*3/uL (ref 0.7–3.1)
MCH: 30.5 pg (ref 26.6–33.0)
MCHC: 31.3 g/dL — ABNORMAL LOW (ref 31.5–35.7)
MCV: 98 fL — AB (ref 79–97)
Monocytes Absolute: 0.8 10*3/uL (ref 0.1–0.9)
Monocytes: 13 %
NEUTROS PCT: 50 %
Neutrophils Absolute: 3.1 10*3/uL (ref 1.4–7.0)
PLATELETS: 345 10*3/uL (ref 150–379)
RBC: 3.54 x10E6/uL — ABNORMAL LOW (ref 3.77–5.28)
RDW: 14.2 % (ref 12.3–15.4)
WBC: 6.1 10*3/uL (ref 3.4–10.8)

## 2016-04-26 MED ORDER — LEVOFLOXACIN 500 MG PO TABS: 500.0000 mg | ORAL_TABLET | Freq: Every day | ORAL | 0 refills | Status: DC

## 2016-04-26 MED ORDER — METRONIDAZOLE 500 MG PO TABS: 500.0000 mg | ORAL_TABLET | Freq: Two times a day (BID) | ORAL | 0 refills | Status: DC

## 2016-04-26 MED ORDER — CO Q-10 100 MG PO CAPS: 100.0000 mg | ORAL_CAPSULE | Freq: Every day | ORAL | 10 refills | Status: DC

## 2016-04-26 NOTE — Progress Notes (Signed)
Subjective:  Patient ID: Amy Merritt, female    DOB: 01/31/1961  Age: 56 y.o. MRN: [id number]  CC: Hypertension (pt here today for routine follow up on her HTN and cholesterol.)   HPI Amy Merritt presents for  follow-up of hypertension. Patient has no history of headache chest pain or shortness of breath or recent cough. Patient also denies symptoms of TIA such as numbness weakness lateralizing. Patient checks  blood pressure at home and has not had any elevated readings recently. A few have been as low as systolic of 98. She is off medication currently for blood pressure due to her recent low blood pressure. She does continue the Xanax. Of note is that it has potential for causing low blood pressure. We discussed that. Currently her blood pressure is up because of her surgery. She barely had rod placement in the thoracic spine 6 weeks ago. Other medicines unchanged.  Patient in for follow-up of elevated cholesterol. Doing well without complaints on current medication. Denies side effects of statin including myalgia and arthralgia and nausea. Also in today for liver function testing. Currently no chest pain, shortness of breath or other cardiovascular related symptoms noted.  Patient patient also reports left lower quadrant pain. It's moderately severe. It is intermittent. She has passed a couple of bright red blood placed bowel movements. Onset about a week to 2 weeks ago. Denies constipation and diarrhea. History Amy Merritt has a past medical history of Arthritis; Chronic kidney disease; Depression; DJD (degenerative joint disease); DJD (degenerative joint disease) of cervical spine; DJD (degenerative joint disease), lumbar; Headache(784.0); Hypertension; IBS (irritable bowel syndrome); Kidney stone; Lymphocytic colitis; and PONV (postoperative nausea and vomiting).   She has a past surgical history that includes Lumbar disc surgery; Tubal ligation; lithotrpsy; Cervical fusion; Colonoscopy  (02/11/2003); Cardiac catheterization (N/A, 06/15/2015); and DG MYLEOGRAM LUMBAR SPINE ([place] HX).   Her family history includes COPD in her paternal grandfather; Cancer in her father and maternal grandfather; Colitis in her father; Diabetes in her father; Heart attack in her mother; Heart attack (age of onset: 57) in her father; Heart disease in her paternal grandmother; Heart failure in her maternal grandmother and mother; Hypertension in her brother, father, and mother.She reports that she quit smoking about 5 years ago. She has never used smokeless tobacco. She reports that she does not drink alcohol or use drugs.  Current Outpatient Prescriptions on File Prior to Visit  Medication Sig Dispense Refill  . acetaminophen (TYLENOL) 500 MG tablet Take 1,000 mg by mouth 3 (three) times daily as needed for headache.    . ADDERALL XR 20 MG 24 hr capsule Take 20 mg by mouth daily.     Marland Kitchen ALPRAZolam (XANAX) 1 MG tablet Take 1 tablet (1 mg total) by mouth 4 (four) times daily as needed. ANXIETY 60 tablet 5  . Artificial Tear Ointment (DRY EYES OP) Place 1 drop into both eyes daily as needed (for dry eyes).    Marland Kitchen atorvastatin (LIPITOR) 40 MG tablet Take 1 tablet (40 mg total) by mouth daily. 90 tablet 3  . conjugated estrogens (PREMARIN) vaginal cream Place 1 g vaginally at bedtime.     Marland Kitchen FLUoxetine (PROZAC) 10 MG capsule TAKE '60MG'$  BY MOUTH DAILY    . gabapentin (NEURONTIN) 400 MG capsule Take 400 mg by mouth 3 (three) times daily.     . methocarbamol (ROBAXIN) 750 MG tablet Take 750 mg by mouth every 6 (six) hours as needed for muscle spasms.    Marland Kitchen  nitroGLYCERIN (NITROSTAT) 0.4 MG SL tablet Place 1 tablet (0.4 mg total) under the tongue every 5 (five) minutes as needed for chest pain. 25 tablet 3  . ondansetron (ZOFRAN ODT) 4 MG disintegrating tablet Take 1 tablet (4 mg total) by mouth every 8 (eight) hours as needed for nausea or vomiting. 6 tablet 0  . oxycodone (ROXICODONE) 30 MG immediate release tablet Take  30 mg by mouth every 4 (four) hours as needed for pain (Dr Nicholaus Bloom).     . pantoprazole (PROTONIX) 40 MG tablet Take 40 mg by mouth daily.   1  . promethazine (PHENERGAN) 25 MG suppository Place 1 suppository (25 mg total) rectally every 6 (six) hours as needed for nausea or vomiting. 6 each 0  . aspirin 81 MG EC tablet TAKE 1 BY MOUTH DAILY (Patient not taking: Reported on 04/26/2016) 90 tablet 0  . [employer] OMEGA-3 KRILL OIL 300 MG CAPS Take 300 mg by mouth daily.     . Glucosamine-Chondroitin (GLUCOSAMINE CHONDR COMPLEX PO) Take 1 tablet by mouth 2 (two) times daily.     . naproxen (NAPROSYN) 500 MG tablet TK 1 T PO  BID  3   No current facility-administered medications on file prior to visit.     ROS Review of Systems  Constitutional: Positive for activity change. Negative for appetite change and fever.  HENT: Negative for congestion, rhinorrhea and sore throat.   Eyes: Negative for visual disturbance.  Respiratory: Negative for cough and shortness of breath.   Cardiovascular: Negative for chest pain and palpitations.  Gastrointestinal: Positive for abdominal pain. Negative for diarrhea and nausea.  Genitourinary: Negative for dysuria.  Musculoskeletal: Positive for back pain. Negative for arthralgias and myalgias.    Objective:  BP (!) 145/76   Pulse (!) 58   Temp 97 F (36.1 C) (Oral)   Ht '5\' 1"'$  (1.549 m)   Wt 149 lb (67.6 kg)   BMI 28.15 kg/m   BP Readings from Last 3 Encounters:  04/26/16 (!) 145/76  03/08/16 102/67  12/10/15 (!) 91/57    Wt Readings from Last 3 Encounters:  04/26/16 149 lb (67.6 kg)  03/08/16 142 lb (64.4 kg)  12/10/15 148 lb (67.1 kg)     Physical Exam  Constitutional: She is oriented to person, place, and time. She appears well-developed and well-nourished. She appears distressed.  HENT:  Head: Normocephalic and atraumatic.  Right Ear: External ear normal.  Left Ear: External ear normal.  Nose: Nose normal.  Mouth/Throat: Oropharynx is  clear and moist.  Eyes: Conjunctivae and EOM are normal. Pupils are equal, round, and reactive to light.  Neck: Normal range of motion. Neck supple. No thyromegaly present.  Cardiovascular: Normal rate, regular rhythm and normal heart sounds.   No murmur heard. Pulmonary/Chest: Effort normal and breath sounds normal. No respiratory distress. She has no wheezes. She has no rales.  Abdominal: Soft. Bowel sounds are normal. She exhibits distension. She exhibits no mass. There is tenderness. There is no rebound and no guarding.  Musculoskeletal: She exhibits tenderness (in her back from recent surgery. difficulty straightening out her back to ambulate.).  Lymphadenopathy:    She has no cervical adenopathy.  Neurological: She is alert and oriented to person, place, and time. She has normal reflexes.  Skin: Skin is warm and dry.  Psychiatric: She has a normal mood and affect. Her behavior is normal. Judgment and thought content normal.     Lab Results  Component Value Date   WBC  11.1 (H) 11/22/2015   HGB 13.9 11/22/2015   HCT 40.8 11/22/2015   PLT 344 11/22/2015   GLUCOSE 109 (H) 11/22/2015   CHOL 233 (H) 10/28/2015   TRIG 139 10/28/2015   HDL 64 10/28/2015   LDLCALC 141 (H) 10/28/2015   ALT 46 11/22/2015   AST 37 11/22/2015   NA 134 (L) 11/22/2015   K 3.6 11/22/2015   CL 96 (L) 11/22/2015   CREATININE 0.84 11/22/2015   BUN 7 11/22/2015   CO2 29 11/22/2015   TSH 1.600 10/28/2015   INR 0.94 06/12/2015    Dg Chest 2 View  Result Date: 11/22/2015 CLINICAL DATA:  Abdominal pain with nausea and vomiting EXAM: CHEST  2 VIEW COMPARISON:  08/26/2013 FINDINGS: Subtle hazy density at the peripheral left base is likely from low volumes and soft tissue attenuation. No convincing pneumonia. No edema, effusion, or pneumothorax. Normal heart size and mediastinal contours. IMPRESSION: Limited low volume chest without definite active disease. There is subtle density at the left base, which will be  again visualized on pending abdominal CT Electronically Signed   By: Monte Fantasia M.D.   On: 11/22/2015 16:46   Ct Abdomen Pelvis W Contrast  Result Date: 11/22/2015 CLINICAL DATA:  Abdominal pain with nausea and vomiting EXAM: CT ABDOMEN AND PELVIS WITH CONTRAST TECHNIQUE: Multidetector CT imaging of the abdomen and pelvis was performed using the standard protocol following bolus administration of intravenous contrast. CONTRAST:  100 cc Isovue 300 intravenous COMPARISON:  03/16/2015 FINDINGS: Lower chest: Diffuse atherosclerotic calcification in the coronaries, age advanced. Mild basilar atelectasis. Finding at the left base on previous chest x-ray was artifactual. Hepatobiliary: No focal liver abnormality.No evidence of biliary obstruction or stone. Pancreas: Unremarkable. Spleen: Unremarkable. Adrenals/Urinary Tract: Negative adrenals. No hydronephrosis or stone. Lobulated bilateral kidneys, likely diffuse mild scarring. Unremarkable bladder for degree of distention. No perivesicular stranding. Stomach/Bowel: No obstruction. No appendicitis. Moderate stool volume. Vascular/Lymphatic: Diffuse aortic atherosclerosis. No acute vascular abnormality. No mass or adenopathy. Reproductive:No pathologic findings. Other: No ascites or pneumoperitoneum. Musculoskeletal: Long thoracolumbar fusion without acute finding. IMPRESSION: 1. No acute finding. 2. Extensive coronary atherosclerosis. Electronically Signed   By: Monte Fantasia M.D.   On: 11/22/2015 18:41    Assessment & Plan:   Marny was seen today for hypertension.  Diagnoses and all orders for this visit:  Hypotension due to drugs -     CBC with Differential/Platelet -     CMP14+EGFR  Essential hypertension -     CBC with Differential/Platelet -     CMP14+EGFR  Other orders -     levofloxacin (LEVAQUIN) 500 MG tablet; Take 1 tablet (500 mg total) by mouth daily. -     metroNIDAZOLE (FLAGYL) 500 MG tablet; Take 1 tablet (500 mg total) by  mouth 2 (two) times daily. -     Coenzyme Q10 (CO Q-10) 100 MG CAPS; Take 100 mg by mouth daily.   I am having Amy Merritt start on levofloxacin, metroNIDAZOLE, and Co Q-10. I am also having her maintain her gabapentin, oxycodone, Glucosamine-Chondroitin (GLUCOSAMINE CHONDR COMPLEX PO), ADDERALL XR, methocarbamol, [employer] OMEGA-3 KRILL OIL, nitroGLYCERIN, acetaminophen, Artificial Tear Ointment (DRY EYES OP), atorvastatin, conjugated estrogens, FLUoxetine, ALPRAZolam, pantoprazole, ondansetron, promethazine, naproxen, and aspirin.  Meds ordered this encounter  Medications  . levofloxacin (LEVAQUIN) 500 MG tablet    Sig: Take 1 tablet (500 mg total) by mouth daily.    Dispense:  7 tablet    Refill:  0  . metroNIDAZOLE (FLAGYL) 500 MG  tablet    Sig: Take 1 tablet (500 mg total) by mouth 2 (two) times daily.    Dispense:  14 tablet    Refill:  0  . Coenzyme Q10 (CO Q-10) 100 MG CAPS    Sig: Take 100 mg by mouth daily.    Dispense:  100 each    Refill:  10      Follow-up: Return in about 6 weeks (around 06/07/2016).  Claretta Fraise, M.D.

## 2016-04-27 NOTE — Addendum Note (Signed)
Addended by: Almeta MonasSTONE, JANIE M on: 04/27/2016 03:27 PM   Modules accepted: Orders

## 2016-04-30 LAB — FE+TIBC+FER+B12+FOLIC
FOLATE: 19.4 ng/mL (ref >3.0)
Ferritin: 64 ng/mL (ref 15–150)
IRON SATURATION: 40 % (ref 15–55)
IRON: 113 ug/dL (ref 27–159)
Total Iron Binding Capacity: 281 ug/dL (ref 250–450)
UIBC: 168 ug/dL (ref 131–425)
VITAMIN B 12: 674 pg/mL (ref 232–1245)

## 2016-04-30 LAB — SPECIMEN STATUS REPORT

## 2016-05-11 NOTE — Addendum Note (Signed)
Addended by: Almeta Monas on: 05/11/2016 03:55 PM   Modules accepted: Orders

## 2016-05-25 ENCOUNTER — Encounter: Payer: Self-pay | Admitting: Family Medicine

## 2016-05-25 ENCOUNTER — Ambulatory Visit (INDEPENDENT_AMBULATORY_CARE_PROVIDER_SITE_OTHER): Payer: 59 | Admitting: Family Medicine

## 2016-05-25 VITALS — BP 128/84 | HR 58 | Temp 96.9°F | Ht 61.0 in | Wt 145.0 lb

## 2016-05-25 DIAGNOSIS — H66002 Acute suppurative otitis media without spontaneous rupture of ear drum, left ear: Secondary | ICD-10-CM

## 2016-05-25 DIAGNOSIS — B850 Pediculosis due to Pediculus humanus capitis: Secondary | ICD-10-CM | POA: Diagnosis not present

## 2016-05-25 MED ORDER — ATORVASTATIN CALCIUM 40 MG PO TABS: 40.0000 mg | ORAL_TABLET | Freq: Every day | ORAL | 3 refills | Status: AC

## 2016-05-25 MED ORDER — CLARITHROMYCIN 500 MG PO TABS
500.0000 mg | ORAL_TABLET | Freq: Two times a day (BID) | ORAL | 0 refills | Status: AC
Start: 2016-05-25 — End: 2016-06-04

## 2016-05-25 NOTE — Progress Notes (Signed)
Subjective:  Patient ID: Amy Merritt, female    DOB: 08-Jun-1960  Age: 56 y.o. MRN: [id number]  CC: Otalgia (pt here today c/o left ear pain and also has concerns regarding her grandson having lice.)   HPI Amy Merritt presents for itching scalp. She has treated him, but fears he is reinfested when he stays with other relatives. (Shared custody.) OTCs medicines not helping. Has used lite mayo tx, but not regular mayo. No shower cap.  Left ear hurts for a few days. No fever. No hearing change.  History Amy Merritt has a past medical history of Arthritis; Chronic kidney disease; Depression; DJD (degenerative joint disease); DJD (degenerative joint disease) of cervical spine; DJD (degenerative joint disease), lumbar; Headache(784.0); Hypertension; IBS (irritable bowel syndrome); Kidney stone; Lymphocytic colitis; and PONV (postoperative nausea and vomiting).   She has a past surgical history that includes Lumbar disc surgery; Tubal ligation; lithotrpsy; Cervical fusion; Colonoscopy (02/11/2003); Cardiac catheterization (N/A, 06/15/2015); and DG MYLEOGRAM LUMBAR SPINE ([facility] HX).   Her family history includes COPD in her paternal grandfather; Cancer in her father and maternal grandfather; Colitis in her father; Diabetes in her father; Heart attack in her mother; Heart attack (age of onset: 77) in her father; Heart disease in her paternal grandmother; Heart failure in her maternal grandmother and mother; Hypertension in her brother, father, and mother.She reports that she quit smoking about 5 years ago. She has never used smokeless tobacco. She reports that she does not drink alcohol or use drugs.  Current Outpatient Prescriptions on File Prior to Visit  Medication Sig Dispense Refill  . acetaminophen (TYLENOL) 500 MG tablet Take 1,000 mg by mouth 3 (three) times daily as needed for headache.    . ADDERALL XR 20 MG 24 hr capsule Take 20 mg by mouth daily.     Marland Kitchen ALPRAZolam (XANAX) 1 MG tablet Take 1  tablet (1 mg total) by mouth 4 (four) times daily as needed. ANXIETY 60 tablet 5  . Artificial Tear Ointment (DRY EYES OP) Place 1 drop into both eyes daily as needed (for dry eyes).    . Coenzyme Q10 (CO Q-10) 100 MG CAPS Take 100 mg by mouth daily. 100 each 10  . conjugated estrogens (PREMARIN) vaginal cream Place 1 g vaginally at bedtime.     . [employer] OMEGA-3 KRILL OIL 300 MG CAPS Take 300 mg by mouth daily.     Marland Kitchen FLUoxetine (PROZAC) 10 MG capsule TAKE  BY MOUTH DAILY    . gabapentin (NEURONTIN) 400 MG capsule Take 400 mg by mouth 3 (three) times daily.     . Glucosamine-Chondroitin (GLUCOSAMINE CHONDR COMPLEX PO) Take 1 tablet by mouth 2 (two) times daily.     . ondansetron (ZOFRAN ODT) 4 MG disintegrating tablet Take 1 tablet (4 mg total) by mouth every 8 (eight) hours as needed for nausea or vomiting. 6 tablet 0  . oxycodone (ROXICODONE) 30 MG immediate release tablet Take 30 mg by mouth every 4 (four) hours as needed for pain (Dr Thyra Breed).     . pantoprazole (PROTONIX) 40 MG tablet Take 40 mg by mouth daily.   1  . promethazine (PHENERGAN) 25 MG suppository Place 1 suppository (25 mg total) rectally every 6 (six) hours as needed for nausea or vomiting. 6 each 0  . aspirin 81 MG EC tablet TAKE 1 BY MOUTH DAILY (Patient not taking: Reported on 04/26/2016) 90 tablet 0  . methocarbamol (ROBAXIN) 750 MG tablet Take 750 mg by mouth every  6 (six) hours as needed for muscle spasms.    . naproxen (NAPROSYN) 500 MG tablet TK 1 T PO  BID  3  . nitroGLYCERIN (NITROSTAT) 0.4 MG SL tablet Place 1 tablet (0.4 mg total) under the tongue every 5 (five) minutes as needed for chest pain. (Patient not taking: Reported on 05/25/2016) 25 tablet 3   No current facility-administered medications on file prior to visit.     ROS Review of Systems  Constitutional: Negative for appetite change, chills, diaphoresis, fatigue and fever.  HENT: Positive for ear pain. Negative for congestion, hearing loss,  postnasal drip, rhinorrhea, sore throat and trouble swallowing.   Respiratory: Negative for cough, chest tightness and shortness of breath.   Cardiovascular: Negative for chest pain and palpitations.  Gastrointestinal: Negative for abdominal pain.  Skin: Negative for rash.    Objective:  BP 128/84   Pulse (!) 58   Temp (!) 96.9 F (36.1 C) (Oral)   Ht  (1.549 m)   Wt 145 lb (65.8 kg)   BMI 27.40 kg/m   Physical Exam  Constitutional: She appears well-developed and well-nourished.  HENT:  Head: Normocephalic and atraumatic.  Right Ear: Tympanic membrane and external ear normal. No decreased hearing is noted.  Left Ear: Tympanic membrane and external ear normal. No decreased hearing is noted.  Nose: Mucosal edema present. Right sinus exhibits no frontal sinus tenderness. Left sinus exhibits no frontal sinus tenderness.  Mouth/Throat: No oropharyngeal exudate or posterior oropharyngeal erythema.  Neck: No Brudzinski's sign noted.  Pulmonary/Chest: Breath sounds normal. No respiratory distress.  Lymphadenopathy:       Head (right side): No preauricular adenopathy present.       Head (left side): No preauricular adenopathy present.       Right cervical: No superficial cervical adenopathy present.      Left cervical: No superficial cervical adenopathy present.  Skin:  No nits found on hair inspection     Assessment & Plan:   Amy Merritt was seen today for otalgia.  Diagnoses and all orders for this visit:  Head lice  Acute suppurative otitis media of left ear without spontaneous rupture of tympanic membrane, recurrence not specified  Other orders -     atorvastatin (LIPITOR) 40 MG tablet; Take 1 tablet (40 mg total) by mouth daily. -     clarithromycin (BIAXIN) 500 MG tablet; Take 1 tablet (500 mg total) by mouth 2 (two) times daily.   I have discontinued Amy Merritt's levofloxacin and metroNIDAZOLE. I am also having her start on clarithromycin. Additionally, I am having  her maintain her gabapentin, oxycodone, Glucosamine-Chondroitin (GLUCOSAMINE CHONDR COMPLEX PO), ADDERALL XR, methocarbamol, CVS OMEGA-3 KRILL OIL, nitroGLYCERIN, acetaminophen, Artificial Tear Ointment (DRY EYES OP), conjugated estrogens, FLUoxetine, ALPRAZolam, pantoprazole, ondansetron, promethazine, naproxen, aspirin, Co Q-10, and atorvastatin.  Meds ordered this encounter  Medications  . atorvastatin (LIPITOR) 40 MG tablet    Sig: Take 1 tablet (40 mg total) by mouth daily.    Dispense:  90 tablet    Refill:  3  . clarithromycin (BIAXIN) 500 MG tablet    Sig: Take 1 tablet (500 mg total) by mouth 2 (two) times daily.    Dispense:  20 tablet    Refill:  0   Mayo tx recommended. Use regular mayo. Cover with shower cap for 8 hours. Wash thoroughly. Do not need insecticides.  Follow-up: Return if symptoms worsen or fail to improve.  Mechele Claude, M.D.

## 2016-06-03 ENCOUNTER — Encounter: Payer: Self-pay | Admitting: Physician Assistant

## 2016-06-03 ENCOUNTER — Ambulatory Visit (INDEPENDENT_AMBULATORY_CARE_PROVIDER_SITE_OTHER): Payer: 59 | Admitting: Physician Assistant

## 2016-06-03 VITALS — BP 99/64 | HR 74 | Temp 97.0°F | Ht 61.0 in | Wt 145.0 lb

## 2016-06-03 DIAGNOSIS — J309 Allergic rhinitis, unspecified: Secondary | ICD-10-CM

## 2016-06-03 DIAGNOSIS — H60332 Swimmer's ear, left ear: Secondary | ICD-10-CM | POA: Diagnosis not present

## 2016-06-03 DIAGNOSIS — B852 Pediculosis, unspecified: Secondary | ICD-10-CM

## 2016-06-03 MED ORDER — OFLOXACIN 0.3 % OT SOLN: 5.0000 [drp] | Freq: Every day | OTIC | 0 refills | Status: AC

## 2016-06-03 MED ORDER — IVERMECTIN 0.5 % EX LOTN: 1.0000 "application " | TOPICAL_LOTION | Freq: Once | CUTANEOUS | 1 refills | Status: AC

## 2016-06-03 NOTE — Progress Notes (Signed)
BP 99/64   Pulse 74   Temp 97 F (36.1 C) (Oral)   Ht  (1.549 m)   Wt 145 lb (65.8 kg)   BMI 27.40 kg/m    Subjective:    Patient ID: Amy Merritt, female    DOB: 04-16-1960, 56 y.o.   MRN: [id number]  HPI: Amy Merritt is a 56 y.o. female presenting on 06/03/2016 for ears hurting and Nasal Congestion  Patient with ear itching and pain in left ear. Thinks she has lice though she has been examined in the past months. Grandson brought infection into the home.  Wants her eyebrows and eyelashes checked.  Cannot get the idea that she is infected out of her head. Used OTC meds and home remedies. Husband is present and knows she needs reassurance and we have discussed sending prescription med if they need it.  Relevant past medical, surgical, family and social history reviewed and updated as indicated. Allergies and medications reviewed and updated.  Past Medical History:  Diagnosis Date  . Arthritis   . Chronic kidney disease   . Depression   . DJD (degenerative joint disease)   . DJD (degenerative joint disease) of cervical spine   . DJD (degenerative joint disease), lumbar   . Headache(784.0)   . Hypertension   . IBS (irritable bowel syndrome)   . Kidney stone   . Lymphocytic colitis   . PONV (postoperative nausea and vomiting)     Past Surgical History:  Procedure Laterality Date  . CARDIAC CATHETERIZATION N/A 06/15/2015   Procedure: Left Heart Cath and Coronary Angiography;  Surgeon: Kathleene Hazel, MD;  Location: [facility] INVASIVE CV LAB;  Service: Cardiovascular;  Laterality: N/A;  . CERVICAL FUSION     x 2  . COLONOSCOPY  02/11/2003  . DG MYLEOGRAM LUMBAR SPINE ([facility] HX)    . lithotrpsy    . LUMBAR DISC SURGERY     x 4- lower back with rods and screws  . TUBAL LIGATION      Review of Systems  Constitutional: Negative.  Negative for activity change, fatigue and fever.  HENT: Positive for ear pain.   Eyes: Negative.   Respiratory: Negative.  Negative for  cough.   Cardiovascular: Negative.  Negative for chest pain.  Gastrointestinal: Negative.  Negative for abdominal pain.  Endocrine: Negative.   Genitourinary: Negative.  Negative for dysuria.  Musculoskeletal: Negative.   Skin: Positive for rash.  Neurological: Negative.     Allergies as of 06/03/2016      Reactions   Amoxicillin-pot Clavulanate Hives, Rash   Celecoxib Hives, Rash   Morphine And Related Itching, Swelling   Liquid only      Medication List       Accurate as of 06/03/16  6:17 PM. Always use your most recent med list.          acetaminophen 500 MG tablet Commonly known as:  TYLENOL Take 1,000 mg by mouth 3 (three) times daily as needed for headache.   ADDERALL XR 20 MG 24 hr capsule Generic drug:  amphetamine-dextroamphetamine Take 20 mg by mouth daily.   ALPRAZolam 1 MG tablet Commonly known as:  XANAX Take 1 tablet (1 mg total) by mouth 4 (four) times daily as needed. ANXIETY   aspirin 81 MG EC tablet TAKE 1 BY MOUTH DAILY   atorvastatin 40 MG tablet Commonly known as:  LIPITOR Take 1 tablet (40 mg total) by mouth daily.   clarithromycin 500 MG tablet Commonly  known as:  BIAXIN Take 1 tablet (500 mg total) by mouth 2 (two) times daily.   Co Q-10 100 MG Caps Take 100 mg by mouth daily.   conjugated estrogens vaginal cream Commonly known as:  PREMARIN Place 1 g vaginally at bedtime.   CVS OMEGA-3 KRILL OIL 300 MG Caps Take 300 mg by mouth daily.   DRY EYES OP Place 1 drop into both eyes daily as needed (for dry eyes).   FLUoxetine 10 MG capsule Commonly known as:  PROZAC TAKE  BY MOUTH DAILY   gabapentin 400 MG capsule Commonly known as:  NEURONTIN Take 400 mg by mouth 3 (three) times daily.   GLUCOSAMINE CHONDR COMPLEX PO Take 1 tablet by mouth 2 (two) times daily.   Ivermectin 0.5 % Lotn Commonly known as:  SKLICE Apply 1 application topically once. Follow package instructions   methocarbamol 750 MG tablet Commonly known  as:  ROBAXIN Take 750 mg by mouth every 6 (six) hours as needed for muscle spasms.   naproxen 500 MG tablet Commonly known as:  NAPROSYN TK 1 T PO  BID   nitroGLYCERIN 0.4 MG SL tablet Commonly known as:  NITROSTAT Place 1 tablet (0.4 mg total) under the tongue every 5 (five) minutes as needed for chest pain.   ofloxacin 0.3 % otic solution Commonly known as:  FLOXIN Place 5 drops into the left ear daily.   ondansetron 4 MG disintegrating tablet Commonly known as:  ZOFRAN ODT Take 1 tablet (4 mg total) by mouth every 8 (eight) hours as needed for nausea or vomiting.   oxycodone 30 MG immediate release tablet Commonly known as:  ROXICODONE Take 30 mg by mouth every 4 (four) hours as needed for pain (Dr Thyra Breed).   pantoprazole 40 MG tablet Commonly known as:  PROTONIX Take 40 mg by mouth daily.   promethazine 25 MG suppository Commonly known as:  PHENERGAN Place 1 suppository (25 mg total) rectally every 6 (six) hours as needed for nausea or vomiting.          Objective:    BP 99/64   Pulse 74   Temp 97 F (36.1 C) (Oral)   Ht  (1.549 m)   Wt 145 lb (65.8 kg)   BMI 27.40 kg/m   Allergies  Allergen Reactions  . Amoxicillin-Pot Clavulanate Hives and Rash  . Celecoxib Hives and Rash  . Morphine And Related Itching and Swelling    Liquid only    Physical Exam  Constitutional: She is oriented to person, place, and time. She appears well-developed and well-nourished.  HENT:  Head: Normocephalic and atraumatic.  Right Ear: Ear canal normal. No drainage. A middle ear effusion is present.  Left Ear: Ear canal normal. There is drainage. A middle ear effusion is present.  Nose: Nose normal. No rhinorrhea.  Mouth/Throat: Oropharynx is clear and moist and mucous membranes are normal. No oropharyngeal exudate or posterior oropharyngeal erythema.  Eyes: Conjunctivae and EOM are normal. Pupils are equal, round, and reactive to light.  Neck: Normal range of  motion. Neck supple.  Cardiovascular: Normal rate, regular rhythm, normal heart sounds and intact distal pulses.   Pulmonary/Chest: Effort normal and breath sounds normal.  Abdominal: Soft. Bowel sounds are normal.  Neurological: She is alert and oriented to person, place, and time. She has normal reflexes.  Skin: Skin is warm and dry. No rash noted.  Psychiatric: She has a normal mood and affect. Her behavior is normal. Judgment and thought  content normal.  Nursing note and vitals reviewed.       Assessment & Plan:   1. Acute swimmer's ear of left side - ofloxacin (FLOXIN) 0.3 % otic solution; Place 5 drops into the left ear daily.  Dispense: 5 mL; Refill: 0  2. Lice - Ivermectin (SKLICE) 0.5 % LOTN; Apply 1 application topically once. Follow package instructions  Dispense: 117 g; Refill: 1  3. Allergic rhinitis, unspecified seasonality, unspecified trigger loratatine flonase   Current Outpatient Prescriptions:  .  acetaminophen (TYLENOL) 500 MG tablet, Take 1,000 mg by mouth 3 (three) times daily as needed for headache., Disp: , Rfl:  .  ADDERALL XR 20 MG 24 hr capsule, Take 20 mg by mouth daily. , Disp: , Rfl:  .  ALPRAZolam (XANAX) 1 MG tablet, Take 1 tablet (1 mg total) by mouth 4 (four) times daily as needed. ANXIETY, Disp: 60 tablet, Rfl: 5 .  Artificial Tear Ointment (DRY EYES OP), Place 1 drop into both eyes daily as needed (for dry eyes)., Disp: , Rfl:  .  aspirin 81 MG EC tablet, TAKE 1 BY MOUTH DAILY, Disp: 90 tablet, Rfl: 0 .  atorvastatin (LIPITOR) 40 MG tablet, Take 1 tablet (40 mg total) by mouth daily., Disp: 90 tablet, Rfl: 3 .  clarithromycin (BIAXIN) 500 MG tablet, Take 1 tablet (500 mg total) by mouth 2 (two) times daily., Disp: 20 tablet, Rfl: 0 .  Coenzyme Q10 (CO Q-10) 100 MG CAPS, Take 100 mg by mouth daily., Disp: 100 each, Rfl: 10 .  conjugated estrogens (PREMARIN) vaginal cream, Place 1 g vaginally at bedtime. , Disp: , Rfl:  .  CVS OMEGA-3 KRILL OIL 300  MG CAPS, Take 300 mg by mouth daily. , Disp: , Rfl:  .  FLUoxetine (PROZAC) 10 MG capsule, TAKE  BY MOUTH DAILY, Disp: , Rfl:  .  gabapentin (NEURONTIN) 400 MG capsule, Take 400 mg by mouth 3 (three) times daily. , Disp: , Rfl:  .  Glucosamine-Chondroitin (GLUCOSAMINE CHONDR COMPLEX PO), Take 1 tablet by mouth 2 (two) times daily. , Disp: , Rfl:  .  methocarbamol (ROBAXIN) 750 MG tablet, Take 750 mg by mouth every 6 (six) hours as needed for muscle spasms., Disp: , Rfl:  .  naproxen (NAPROSYN) 500 MG tablet, TK 1 T PO  BID, Disp: , Rfl: 3 .  nitroGLYCERIN (NITROSTAT) 0.4 MG SL tablet, Place 1 tablet (0.4 mg total) under the tongue every 5 (five) minutes as needed for chest pain., Disp: 25 tablet, Rfl: 3 .  ondansetron (ZOFRAN ODT) 4 MG disintegrating tablet, Take 1 tablet (4 mg total) by mouth every 8 (eight) hours as needed for nausea or vomiting., Disp: 6 tablet, Rfl: 0 .  oxycodone (ROXICODONE) 30 MG immediate release tablet, Take 30 mg by mouth every 4 (four) hours as needed for pain (Dr Thyra Breed). , Disp: , Rfl:  .  pantoprazole (PROTONIX) 40 MG tablet, Take 40 mg by mouth daily. , Disp: , Rfl: 1 .  promethazine (PHENERGAN) 25 MG suppository, Place 1 suppository (25 mg total) rectally every 6 (six) hours as needed for nausea or vomiting., Disp: 6 each, Rfl: 0 .  Ivermectin (SKLICE) 0.5 % LOTN, Apply 1 application topically once. Follow package instructions, Disp: 117 g, Rfl: 1 .  ofloxacin (FLOXIN) 0.3 % otic solution, Place 5 drops into the left ear daily., Disp: 5 mL, Rfl: 0  Continue all other maintenance medications as listed above.  Follow up plan: Return if symptoms worsen  or fail to improve.  Educational handout given for swimmer's ear  Remus Loffler PA-C [facility] [address]  [place], [place] [address] [phone]   06/03/2016, 6:17 PM

## 2016-06-03 NOTE — Patient Instructions (Signed)
Otitis Externa Otitis externa is an infection of the outer ear canal. The outer ear canal is the area between the outside of the ear and the eardrum. Otitis externa is sometimes called "swimmer's ear." Follow these instructions at home:  If you were given antibiotic ear drops, use them as told by your doctor. Do not stop using them even if your condition gets better.  Take over-the-counter and prescription medicines only as told by your doctor.  Keep all follow-up visits as told by your doctor. This is important. How is this prevented?  Keep your ear dry. Use the corner of a towel to dry your ear after you swim or bathe.  Try not to scratch or put things in your ear. Doing these things makes it easier for germs to grow in your ear.  Avoid swimming in lakes, dirty water, or pools that may not have the right amount of a chemical called chlorine.  Consider making ear drops and putting 3 or 4 drops in each ear after you swim. Ask your doctor about how you can make ear drops. Contact a doctor if:  You have a fever.  After 3 days your ear is still red, swollen, or painful.  After 3 days you still have pus coming from your ear.  Your redness, swelling, or pain gets worse.  You have a really bad headache.  You have redness, swelling, pain, or tenderness behind your ear. This information is not intended to replace advice given to you by your health care provider. Make sure you discuss any questions you have with your health care provider. Document Released: 07/13/2007 Document Revised: 02/19/2015 Document Reviewed: 11/03/2014 Elsevier Interactive Patient Education  2017 [employer]  

## 2016-06-07 ENCOUNTER — Ambulatory Visit: Payer: 59 | Admitting: Family Medicine

## 2016-06-13 ENCOUNTER — Ambulatory Visit: Payer: 59 | Admitting: Family Medicine

## 2016-06-21 ENCOUNTER — Encounter: Payer: Self-pay | Admitting: Family Medicine

## 2016-06-21 ENCOUNTER — Ambulatory Visit (INDEPENDENT_AMBULATORY_CARE_PROVIDER_SITE_OTHER): Payer: 59 | Admitting: Family Medicine

## 2016-06-21 VITALS — BP 122/73 | HR 75 | Temp 97.5°F | Ht 61.0 in | Wt 143.0 lb

## 2016-06-21 DIAGNOSIS — R4681 Obsessive-compulsive behavior: Secondary | ICD-10-CM

## 2016-06-21 DIAGNOSIS — D508 Other iron deficiency anemias: Secondary | ICD-10-CM | POA: Diagnosis not present

## 2016-06-21 DIAGNOSIS — E2740 Unspecified adrenocortical insufficiency: Secondary | ICD-10-CM

## 2016-06-21 DIAGNOSIS — E559 Vitamin D deficiency, unspecified: Secondary | ICD-10-CM

## 2016-06-21 NOTE — Progress Notes (Signed)
Subjective:  Patient ID: Riley Lam, female    DOB: May 27, 1960  Age: 56 y.o. MRN: [id number]  CC: Ear Pain (pt here today c/o ear pain still and thinks she has "lice in her ear" )   HPI DEMISHA NOKES presents for being convinced she has head lice that have crawled into her left ear and died causing her current pain.Her left ear is been hurting several days. She has gone through several home treatments for head lice. After previous evaluation here she started treatment for Otitis Externa. She brings in some scrapings from her hair and scalp convinced that the ycontain lice.She also has read up on adrenal fatigue and feels she has every symptom. Insidious onset of lassitude over several weeks to months. Hair loss, skin changes and several others.   History Dimond has a past medical history of Arthritis; Chronic kidney disease; Depression; DJD (degenerative joint disease); DJD (degenerative joint disease) of cervical spine; DJD (degenerative joint disease), lumbar; Headache(784.0); Hypertension; IBS (irritable bowel syndrome); Kidney stone; Lymphocytic colitis; and PONV (postoperative nausea and vomiting).   She has a past surgical history that includes Lumbar disc surgery; Tubal ligation; lithotrpsy; Cervical fusion; Colonoscopy (02/11/2003); Cardiac catheterization (N/A, 06/15/2015); and DG MYLEOGRAM LUMBAR SPINE ([place] HX).   Her family history includes COPD in her paternal grandfather; Cancer in her father and maternal grandfather; Colitis in her father; Diabetes in her father; Heart attack in her mother; Heart attack (age of onset: 4) in her father; Heart disease in her paternal grandmother; Heart failure in her maternal grandmother and mother; Hypertension in her brother, father, and mother.She reports that she quit smoking about 5 years ago. She has never used smokeless tobacco. She reports that she does not drink alcohol or use drugs.  Current Outpatient Prescriptions on File Prior to Visit    Medication Sig Dispense Refill  . acetaminophen (TYLENOL) 500 MG tablet Take 1,000 mg by mouth 3 (three) times daily as needed for headache.    . ADDERALL XR 20 MG 24 hr capsule Take 20 mg by mouth daily.     Marland Kitchen ALPRAZolam (XANAX) 1 MG tablet Take 1 tablet (1 mg total) by mouth 4 (four) times daily as needed. ANXIETY 60 tablet 5  . Artificial Tear Ointment (DRY EYES OP) Place 1 drop into both eyes daily as needed (for dry eyes).    Marland Kitchen atorvastatin (LIPITOR) 40 MG tablet Take 1 tablet (40 mg total) by mouth daily. 90 tablet 3  . Coenzyme Q10 (CO Q-10) 100 MG CAPS Take 100 mg by mouth daily. 100 each 10  . FLUoxetine (PROZAC) 10 MG capsule TAKE 60MG BY MOUTH DAILY    . gabapentin (NEURONTIN) 400 MG capsule Take 400 mg by mouth 3 (three) times daily.     . Glucosamine-Chondroitin (GLUCOSAMINE CHONDR COMPLEX PO) Take 1 tablet by mouth 2 (two) times daily.     . nitroGLYCERIN (NITROSTAT) 0.4 MG SL tablet Place 1 tablet (0.4 mg total) under the tongue every 5 (five) minutes as needed for chest pain. 25 tablet 3  . ondansetron (ZOFRAN ODT) 4 MG disintegrating tablet Take 1 tablet (4 mg total) by mouth every 8 (eight) hours as needed for nausea or vomiting. 6 tablet 0  . oxycodone (ROXICODONE) 30 MG immediate release tablet Take 30 mg by mouth every 4 (four) hours as needed for pain (Dr Nicholaus Bloom).     . pantoprazole (PROTONIX) 40 MG tablet Take 40 mg by mouth daily.   1  .  promethazine (PHENERGAN) 25 MG suppository Place 1 suppository (25 mg total) rectally every 6 (six) hours as needed for nausea or vomiting. 6 each 0  . aspirin 81 MG EC tablet TAKE 1 BY MOUTH DAILY (Patient not taking: Reported on 06/21/2016) 90 tablet 0  . conjugated estrogens (PREMARIN) vaginal cream Place 1 g vaginally at bedtime.     . [employer] OMEGA-3 KRILL OIL 300 MG CAPS Take 300 mg by mouth daily.     . naproxen (NAPROSYN) 500 MG tablet TK 1 T PO  BID  3   No current facility-administered medications on file prior to visit.      ROS Review of Systems  Constitutional: Positive for activity change and fatigue. Negative for appetite change and fever.  HENT: Negative for congestion, rhinorrhea and sore throat.   Eyes: Negative for visual disturbance.  Respiratory: Positive for shortness of breath and wheezing. Negative for cough.   Cardiovascular: Positive for palpitations. Negative for chest pain.  Gastrointestinal: Positive for abdominal pain. Negative for diarrhea and nausea.  Genitourinary: Negative for dysuria.  Musculoskeletal: Positive for arthralgias and myalgias.    Objective:  BP 122/73   Pulse 75   Temp 97.5 F (36.4 C) (Oral)   Ht _0  (1.549 m)   Wt 143 lb (64.9 kg)   BMI 27.02 kg/m   Physical Exam  Constitutional: She is oriented to person, place, and time. She appears well-developed and well-nourished. No distress.  HENT:  Head: Normocephalic and atraumatic.  Right Ear: External ear normal.  Left Ear: External ear normal.  Nose: Nose normal.  Mouth/Throat: Oropharynx is clear and moist.  Eyes: Conjunctivae and EOM are normal. Pupils are equal, round, and reactive to light.  Neck: Normal range of motion. Neck supple. No thyromegaly present.  Cardiovascular: Normal rate, regular rhythm and normal heart sounds.   No murmur heard. Pulmonary/Chest: Effort normal and breath sounds normal. No respiratory distress. She has no wheezes. She has no rales.  Abdominal: Soft. Bowel sounds are normal. She exhibits no distension. There is no tenderness.  Lymphadenopathy:    She has no cervical adenopathy.  Neurological: She is alert and oriented to person, place, and time. She has normal reflexes.  Skin: Skin is warm and dry.  Psychiatric: Her speech is normal. Her affect is blunt and labile. She is slowed. Thought content is paranoid. Cognition and memory are normal. She expresses inappropriate judgment.    Assessment & Plan:   Ivanell was seen today for ear pain.  Diagnoses and all orders for  this visit:  Adrenal insufficiency ([place]) -     Cancel: Cortisol - AM -     ACTH -     BMP8+EGFR -     Magnesium  Vitamin D deficiency -     VITAMIN D 25 Hydroxy (Vit-D Deficiency, Fractures) -     BMP8+EGFR -     Magnesium  Other iron deficiency anemia -     BMP8+EGFR -     Magnesium -     Anemia Profile B  Obsessive-compulsive behavior   I have discontinued Ms. Bomba's methocarbamol. I am also having her maintain her gabapentin, oxycodone, Glucosamine-Chondroitin (GLUCOSAMINE CHONDR COMPLEX PO), ADDERALL XR, CVS OMEGA-3 KRILL OIL, nitroGLYCERIN, acetaminophen, Artificial Tear Ointment (DRY EYES OP), conjugated estrogens, FLUoxetine, ALPRAZolam, pantoprazole, ondansetron, promethazine, naproxen, aspirin, Co Q-10, and atorvastatin.  No orders of the defined types were placed in this encounter.    Follow-up: Return in about 1 week (around 06/28/2016).  Claretta Fraise,  M.D. 

## 2016-06-22 ENCOUNTER — Other Ambulatory Visit: Payer: 59

## 2016-06-23 LAB — BMP8+EGFR
BUN / CREAT RATIO: 9 (ref 9–23)
BUN: 8 mg/dL (ref 6–24)
CALCIUM: 9.5 mg/dL (ref 8.7–10.2)
CHLORIDE: 99 mmol/L (ref 96–106)
CO2: 26 mmol/L (ref 18–29)
CREATININE: 0.89 mg/dL (ref 0.57–1.00)
GFR calc Af Amer: 86 mL/min/{1.73_m2} (ref >59)
GFR calc non Af Amer: 74 mL/min/{1.73_m2} (ref >59)
GLUCOSE: 95 mg/dL (ref 65–99)
Potassium: 4.8 mmol/L (ref 3.5–5.2)
Sodium: 141 mmol/L (ref 134–144)

## 2016-06-23 LAB — ANEMIA PROFILE B
BASOS ABS: 0 10*3/uL (ref 0.0–0.2)
Basos: 0 %
EOS (ABSOLUTE): 0.2 10*3/uL (ref 0.0–0.4)
Eos: 2 %
FERRITIN: 64 ng/mL (ref 15–150)
HEMATOCRIT: 39.9 % (ref 34.0–46.6)
HEMOGLOBIN: 12.9 g/dL (ref 11.1–15.9)
IMMATURE GRANS (ABS): 0 10*3/uL (ref 0.0–0.1)
Immature Granulocytes: 0 %
Iron Saturation: 39 % (ref 15–55)
Iron: 101 ug/dL (ref 27–159)
LYMPHS: 14 %
Lymphocytes Absolute: 1.2 10*3/uL (ref 0.7–3.1)
MCH: 31.7 pg (ref 26.6–33.0)
MCHC: 32.3 g/dL (ref 31.5–35.7)
MCV: 98 fL — AB (ref 79–97)
MONOS ABS: 0.5 10*3/uL (ref 0.1–0.9)
Monocytes: 7 %
NEUTROS PCT: 77 %
Neutrophils Absolute: 6.2 10*3/uL (ref 1.4–7.0)
PLATELETS: 277 10*3/uL (ref 150–379)
RBC: 4.07 x10E6/uL (ref 3.77–5.28)
RDW: 13.5 % (ref 12.3–15.4)
Retic Ct Pct: 1.2 % (ref 0.6–2.6)
TIBC: 262 ug/dL (ref 250–450)
UIBC: 161 ug/dL (ref 131–425)
VITAMIN B 12: 715 pg/mL (ref 232–1245)
WBC: 8.1 10*3/uL (ref 3.4–10.8)

## 2016-06-23 LAB — MAGNESIUM: Magnesium: 1.9 mg/dL (ref 1.6–2.3)

## 2016-06-23 LAB — ACTH: ACTH: 7 pg/mL — ABNORMAL LOW (ref 7.2–63.3)

## 2016-06-23 LAB — VITAMIN D 25 HYDROXY (VIT D DEFICIENCY, FRACTURES): Vit D, 25-Hydroxy: 33.8 ng/mL (ref 30.0–100.0)

## 2016-06-24 ENCOUNTER — Other Ambulatory Visit: Payer: Self-pay | Admitting: Family Medicine

## 2016-06-24 DIAGNOSIS — R5383 Other fatigue: Secondary | ICD-10-CM

## 2016-06-27 ENCOUNTER — Other Ambulatory Visit: Payer: Self-pay | Admitting: *Deleted

## 2016-07-01 ENCOUNTER — Other Ambulatory Visit: Payer: 59

## 2016-07-01 DIAGNOSIS — D649 Anemia, unspecified: Secondary | ICD-10-CM

## 2016-07-01 DIAGNOSIS — R5383 Other fatigue: Secondary | ICD-10-CM

## 2016-07-02 LAB — ANEMIA PROFILE B
BASOS ABS: 0 10*3/uL (ref 0.0–0.2)
Basos: 0 %
EOS (ABSOLUTE): 0.4 10*3/uL (ref 0.0–0.4)
Eos: 5 %
Ferritin: 63 ng/mL (ref 15–150)
Folate: >20 ng/mL (ref >3.0)
Hematocrit: 39.6 % (ref 34.0–46.6)
Hemoglobin: 12.9 g/dL (ref 11.1–15.9)
IMMATURE GRANS (ABS): 0 10*3/uL (ref 0.0–0.1)
Immature Granulocytes: 0 %
Iron Saturation: 37 % (ref 15–55)
Iron: 99 ug/dL (ref 27–159)
LYMPHS: 19 %
Lymphocytes Absolute: 1.5 10*3/uL (ref 0.7–3.1)
MCH: 31.8 pg (ref 26.6–33.0)
MCHC: 32.6 g/dL (ref 31.5–35.7)
MCV: 98 fL — ABNORMAL HIGH (ref 79–97)
MONOCYTES: 11 %
MONOS ABS: 0.9 10*3/uL (ref 0.1–0.9)
Neutrophils Absolute: 5 10*3/uL (ref 1.4–7.0)
Neutrophils: 65 %
PLATELETS: 265 10*3/uL (ref 150–379)
RBC: 4.06 x10E6/uL (ref 3.77–5.28)
RDW: 13.7 % (ref 12.3–15.4)
RETIC CT PCT: 1.1 % (ref 0.6–2.6)
Total Iron Binding Capacity: 271 ug/dL (ref 250–450)
UIBC: 172 ug/dL (ref 131–425)
VITAMIN B 12: 635 pg/mL (ref 232–1245)
WBC: 7.7 10*3/uL (ref 3.4–10.8)

## 2016-07-02 LAB — VITAMIN D 25 HYDROXY (VIT D DEFICIENCY, FRACTURES): VIT D 25 HYDROXY: 34.3 ng/mL (ref 30.0–100.0)

## 2016-07-04 LAB — ACTH: ACTH: 111.4 pg/mL — AB (ref 7.2–63.3)

## 2016-07-04 LAB — CORTISOL: Cortisol: 24.7 ug/dL

## 2016-07-05 ENCOUNTER — Other Ambulatory Visit: Payer: Self-pay | Admitting: Family

## 2016-07-05 ENCOUNTER — Other Ambulatory Visit: Payer: Self-pay | Admitting: *Deleted

## 2016-07-05 DIAGNOSIS — E270 Other adrenocortical overactivity: Secondary | ICD-10-CM

## 2016-07-06 ENCOUNTER — Encounter: Payer: Self-pay | Admitting: Family Medicine

## 2016-07-20 ENCOUNTER — Encounter: Payer: Self-pay | Admitting: "Endocrinology

## 2016-07-20 ENCOUNTER — Ambulatory Visit (INDEPENDENT_AMBULATORY_CARE_PROVIDER_SITE_OTHER): Payer: 59 | Admitting: "Endocrinology

## 2016-07-20 VITALS — BP 154/91 | HR 70 | Ht 61.0 in | Wt 141.0 lb

## 2016-07-20 DIAGNOSIS — R6889 Other general symptoms and signs: Secondary | ICD-10-CM | POA: Diagnosis not present

## 2016-07-20 NOTE — Progress Notes (Signed)
Subjective:    Patient ID: Amy Merritt, female    DOB: Oct 11, 1960, PCP Mechele Claude, MD   Past Medical History:  Diagnosis Date  . Arthritis   . Chronic kidney disease   . Depression   . DJD (degenerative joint disease)   . DJD (degenerative joint disease) of cervical spine   . DJD (degenerative joint disease), lumbar   . Headache(784.0)   . Hypertension   . IBS (irritable bowel syndrome)   . Kidney stone   . Lymphocytic colitis   . PONV (postoperative nausea and vomiting)    Past Surgical History:  Procedure Laterality Date  . CARDIAC CATHETERIZATION N/A 06/15/2015   Procedure: Left Heart Cath and Coronary Angiography;  Surgeon: Kathleene Hazel, MD;  Location: [facility] INVASIVE CV LAB;  Service: Cardiovascular;  Laterality: N/A;  . CERVICAL FUSION     x 2  . COLONOSCOPY  02/11/2003  . DG MYLEOGRAM LUMBAR SPINE ([facility] HX)    . lithotrpsy    . LUMBAR DISC SURGERY     x 4- lower back with rods and screws  . TUBAL LIGATION     Social History   Social History  . Marital status: Married    Spouse name: N/A  . Number of children: 2  . Years of education: N/A   Social History Main Topics  . Smoking status: Former Smoker    Quit date: 08/27/2010  . Smokeless tobacco: Never Used  . Alcohol use No  . Drug use: No  . Sexual activity: Yes    Birth control/ protection: Surgical   Other Topics Concern  . None   Social History Narrative  . None   Outpatient Encounter Prescriptions as of 07/20/2016  Medication Sig  . oxyCODONE-acetaminophen (PERCOCET) 10-325 MG tablet Take 1 tablet by mouth every 6 (six) hours as needed for pain.  Marland Kitchen tiZANidine (ZANAFLEX) 4 MG capsule Take 4 mg by mouth every 6 (six) hours.  Marland Kitchen acetaminophen (TYLENOL) 500 MG tablet Take 1,000 mg by mouth 3 (three) times daily as needed for headache.  . ADDERALL XR 20 MG 24 hr capsule Take 20 mg by mouth daily.   Marland Kitchen ALPRAZolam (XANAX) 1 MG tablet Take 1 tablet (1 mg total) by mouth 4 (four) times daily  as needed. ANXIETY  . Artificial Tear Ointment (DRY EYES OP) Place 1 drop into both eyes daily as needed (for dry eyes).  Marland Kitchen aspirin 81 MG EC tablet TAKE 1 BY MOUTH DAILY (Patient not taking: Reported on 06/21/2016)  . atorvastatin (LIPITOR) 40 MG tablet Take 1 tablet (40 mg total) by mouth daily.  . Coenzyme Q10 (CO Q-10) 100 MG CAPS Take 100 mg by mouth daily.  Marland Kitchen conjugated estrogens (PREMARIN) vaginal cream Place 1 g vaginally at bedtime.   . CVS OMEGA-3 KRILL OIL 300 MG CAPS Take 300 mg by mouth daily.   Marland Kitchen FLUoxetine (PROZAC) 10 MG capsule TAKE 60MG  BY MOUTH DAILY  . gabapentin (NEURONTIN) 400 MG capsule Take 400 mg by mouth 3 (three) times daily.   . Glucosamine-Chondroitin (GLUCOSAMINE CHONDR COMPLEX PO) Take 1 tablet by mouth 2 (two) times daily.   . naproxen (NAPROSYN) 500 MG tablet TK 1 T PO  BID  . nitroGLYCERIN (NITROSTAT) 0.4 MG SL tablet Place 1 tablet (0.4 mg total) under the tongue every 5 (five) minutes as needed for chest pain.  Marland Kitchen ondansetron (ZOFRAN ODT) 4 MG disintegrating tablet Take 1 tablet (4 mg total) by mouth every 8 (eight) hours  as needed for nausea or vomiting.  Marland Kitchen oxycodone (ROXICODONE) 30 MG immediate release tablet Take 30 mg by mouth every 4 (four) hours as needed for pain (Dr Thyra Breed).   . pantoprazole (PROTONIX) 40 MG tablet Take 40 mg by mouth daily.   . promethazine (PHENERGAN) 25 MG suppository Place 1 suppository (25 mg total) rectally every 6 (six) hours as needed for nausea or vomiting.   No facility-administered encounter medications on file as of 07/20/2016.    ALLERGIES: Allergies  Allergen Reactions  . Amoxicillin-Pot Clavulanate Hives and Rash  . Celecoxib Hives and Rash  . Morphine And Related Itching and Swelling    Liquid only    VACCINATION STATUS: Immunization History  Administered Date(s) Administered  . Influenza Split 11/18/2012  . Influenza, Seasonal, Injecte, Preservative Fre 12/12/2013  . Influenza,inj,Quad PF,36+ Mos  10/28/2015    HPI 56 year old female patient with multiple medical problems as above. She is being seen in consultation for abnormal he fluctuating ACTH levels requested by Mechele Claude, MD. - She reports that she did have problems with fatigue, fluctuating weight, and fluctuation blood pressure for the last several weeks and requested for comprehensive blood work which on  May 16th 2018 showed ACTH of 7 which was repeated on subsequent labs on May 25 and returned high at 111 (normal 7-63). - She does not report any prior history of adrenal dysfunction, pituitary dysfunction, nor any history of prolonged exposure to steroids. - She takes multiple antipain medications related to her repeated  back surgery, including large dose of oxycodone. Patient still complains of uncontrolled pain throughout her body. - Her weight has changed by only 5 pounds over the last year. - She denies heat or cold intolerance. - He wears a large block brace related to her recent surgery.   Review of Systems  Constitutional: + Steady weight, + fatigue, no subjective hyperthermia, no subjective hypothermia Eyes: no blurry vision, no xerophthalmia ENT: no sore throat, no nodules palpated in throat, no dysphagia/odynophagia, no hoarseness Cardiovascular: no Chest Pain, no Shortness of Breath, no palpitations, no leg swelling Respiratory: no cough, no SOB Gastrointestinal: no Nausea/Vomiting/Diarhhea Musculoskeletal: + muscle/joint aches Skin: no rashes Neurological: no tremors, no numbness, no tingling, no dizziness Psychiatric: + depression, + anxiety  Objective:    BP (!) 154/91   Pulse 70   Ht 5\' 1"  (1.549 m)   Wt 141 lb (64 kg)   BMI 26.64 kg/m   Wt Readings from Last 3 Encounters:  07/20/16 141 lb (64 kg)  06/21/16 143 lb (64.9 kg)  06/03/16 145 lb (65.8 kg)    Physical Exam  Constitutional: Appropriate weight for height, not in acute distress Eyes: PERRLA, EOMI, no exophthalmos ENT: moist  mucous membranes, no thyromegaly, no cervical lymphadenopathy Cardiovascular: normal precordial activity, Regular Rate and Rhythm, no Murmur/Rubs/Gallops Respiratory:  adequate breathing efforts, no gross chest deformity, Clear to auscultation bilaterally Gastrointestinal: abdomen soft, Non -tender, No distension, Bowel Sounds present Musculoskeletal:  Limited spinal flexibility/ multiple scars from prior back surgeries, no gross deformities, strength intact in all four extremities Skin: moist, warm, no rashes Neurological: no tremor with outstretched hands, Deep tendon reflexes normal in all four extremities.  CMP ( most recent) CMP     Component Value Date/Time   NA 141 06/22/2016 1040   K 4.8 06/22/2016 1040   CL 99 06/22/2016 1040   CO2 26 06/22/2016 1040   GLUCOSE 95 06/22/2016 1040   GLUCOSE 109 (H) 11/22/2015 1458   BUN  8 06/22/2016 1040   CREATININE 0.89 06/22/2016 1040   CREATININE 0.90 06/29/2015 1032   CALCIUM 9.5 06/22/2016 1040   PROT 6.3 04/26/2016 0918   ALBUMIN 3.7 04/26/2016 0918   AST 30 04/26/2016 0918   ALT 24 04/26/2016 0918   ALKPHOS 197 (H) 04/26/2016 0918   BILITOT 0.2 04/26/2016 0918   GFRNONAA 74 06/22/2016 1040   GFRNONAA 59 (L) 08/20/2012 1223   GFRAA 86 06/22/2016 1040   GFRAA 68 08/20/2012 1223     Lipid Panel ( most recent) Lipid Panel     Component Value Date/Time   CHOL 233 (H) 10/28/2015 1546   TRIG 139 10/28/2015 1546   TRIG 89 02/25/2014 1121   HDL 64 10/28/2015 1546   HDL 59 02/25/2014 1121   CHOLHDL 3.6 10/28/2015 1546   CHOLHDL 3.5 05/21/2012 1211   VLDL 22 05/21/2012 1211   LDLCALC 141 (H) 10/28/2015 1546    Results for Amy SchlatterWILKINS, Lyzbeth V (MRN [id number]) as of 07/20/2016 15:46  Ref. Range 06/22/2016 10:40 07/01/2016 08:19  ACTH Latest Ref Range: 7.2 - 63.3 pg/mL 7.0 (L) 111.4 (H)  Cortisol Latest Units: ug/dL  52.824.7     Assessment & Plan:   1. Abnormal endocrine laboratory test finding - I have reviewed her available records  and clinically evaluated this patient. - Her records do not reflect specific adrenal dysfunction at this time. - Suspicion for adrenal insufficiency is low as well as for  Cushing's syndrome. - ACTH  production is rhythmic by nature and as such by itself is not a reliable test to  Diagnose adrenal dysfunction. - However these labs need to be clarified with a better test. I will proceed to obtain 24-hour urine cortisol measurement. - Review of her prior abdominal imaging showed that the adrenal glands are normal in appearance. - If the urine cortisol level is indicated if of Cushing's disease, she may be considered for pituitary/sella MRI.  - She will return in 4 weeks with her lab results for reevaluation. I did not initiate any new prescription today. Patient seeks additional pain medications for which I have advised her to stick to providers prescribing her multiple antipsychotics and narcotic medications.  - I advised patient to maintain close follow up with Mechele ClaudeStacks, Warren, MD for primary care needs. Follow up plan: Return in about 4 weeks (around 08/17/2016) for 24 hour urine free Cortisol.  Marquis LunchGebre Adonias Demore, MD Phone: [phone]  Fax: [phone]   07/20/2016, 4:19 PM

## 2016-07-25 ENCOUNTER — Telehealth: Payer: Self-pay | Admitting: Family Medicine

## 2016-07-25 ENCOUNTER — Other Ambulatory Visit: Payer: Self-pay

## 2016-07-25 DIAGNOSIS — R6889 Other general symptoms and signs: Secondary | ICD-10-CM

## 2016-07-25 NOTE — Telephone Encounter (Signed)
Pt thinks she has parasites appt scheduled for evaluation

## 2016-07-26 ENCOUNTER — Ambulatory Visit: Payer: 59 | Admitting: Family Medicine

## 2016-07-29 LAB — CORTISOL, URINE, 24 HOUR
Cortisol (Ur), Free: 27.5 mcg/24 h (ref 4.0–50.0)
RESULTS RECEIVED: 0.92 g/(24.h) (ref 0.63–2.50)

## 2016-08-17 ENCOUNTER — Encounter: Payer: Self-pay | Admitting: "Endocrinology

## 2016-08-17 ENCOUNTER — Ambulatory Visit (INDEPENDENT_AMBULATORY_CARE_PROVIDER_SITE_OTHER): Payer: 59 | Admitting: "Endocrinology

## 2016-08-17 VITALS — BP 172/96 | HR 69 | Ht 61.0 in | Wt 142.0 lb

## 2016-08-17 DIAGNOSIS — R6889 Other general symptoms and signs: Secondary | ICD-10-CM | POA: Diagnosis not present

## 2016-08-17 NOTE — Progress Notes (Signed)
Subjective:    Patient ID: Amy Merritt, female    DOB: 10/20/1960, PCP Mechele ClaudeStacks, [place], MD   Past Medical History:  Diagnosis Date  . Arthritis   . Chronic kidney disease   . Depression   . DJD (degenerative joint disease)   . DJD (degenerative joint disease) of cervical spine   . DJD (degenerative joint disease), lumbar   . Headache(784.0)   . Hypertension   . IBS (irritable bowel syndrome)   . Kidney stone   . Lymphocytic colitis   . Morgellons disease   . PONV (postoperative nausea and vomiting)    Past Surgical History:  Procedure Laterality Date  . CARDIAC CATHETERIZATION N/A 06/15/2015   Procedure: Left Heart Cath and Coronary Angiography;  Surgeon: Kathleene Hazelhristopher D McAlhany, MD;  [facility];  Service: Cardiovascular;  Laterality: N/A;  . CERVICAL FUSION     x 2  . COLONOSCOPY  02/11/2003  . DG MYLEOGRAM LUMBAR SPINE ([facility] HX)    . lithotrpsy    . LUMBAR DISC SURGERY     x 4- lower back with rods and screws  . TUBAL LIGATION     Social History   Social History  . Marital status: Married    Spouse name: N/A  . Number of children: 2  . Years of education: N/A   Social History Main Topics  . Smoking status: Former Smoker    Quit date: 08/27/2010  . Smokeless tobacco: Never Used  . Alcohol use No  . Drug use: No  . Sexual activity: Yes    Birth control/ protection: Surgical   Other Topics Concern  . None   Social History Narrative  . None   Outpatient Encounter Prescriptions as of 08/17/2016  Medication Sig  . trifluoperazine (STELAZINE) 1 MG tablet Take 1 mg by mouth as directed.  Marland Kitchen. acetaminophen (TYLENOL) 500 MG tablet Take 1,000 mg by mouth 3 (three) times daily as needed for headache.  . ADDERALL XR 20 MG 24 hr capsule Take 20 mg by mouth daily.   Marland Kitchen. ALPRAZolam (XANAX) 1 MG tablet Take 1 tablet (1 mg total) by mouth 4 (four) times daily as needed. ANXIETY  . Artificial Tear Ointment (DRY EYES OP) Place 1 drop into both eyes daily as  needed (for dry eyes).  Marland Kitchen. aspirin 81 MG EC tablet TAKE 1 BY MOUTH DAILY (Patient not taking: Reported on 06/21/2016)  . atorvastatin (LIPITOR) 40 MG tablet Take 1 tablet (40 mg total) by mouth daily.  . Coenzyme Q10 (CO Q-10) 100 MG CAPS Take 100 mg by mouth daily.  Marland Kitchen. conjugated estrogens (PREMARIN) vaginal cream Place 1 g vaginally at bedtime.   . [employer] OMEGA-3 KRILL OIL 300 MG CAPS Take 300 mg by mouth daily.   Marland Kitchen. FLUoxetine (PROZAC) 10 MG capsule TAKE 60MG  BY MOUTH DAILY  . gabapentin (NEURONTIN) 400 MG capsule Take 400 mg by mouth 3 (three) times daily.   . Glucosamine-Chondroitin (GLUCOSAMINE CHONDR COMPLEX PO) Take 1 tablet by mouth 2 (two) times daily.   . naproxen (NAPROSYN) 500 MG tablet TK 1 T PO  BID  . nitroGLYCERIN (NITROSTAT) 0.4 MG SL tablet Place 1 tablet (0.4 mg total) under the tongue every 5 (five) minutes as needed for chest pain.  Marland Kitchen. ondansetron (ZOFRAN ODT) 4 MG disintegrating tablet Take 1 tablet (4 mg total) by mouth every 8 (eight) hours as needed for nausea or vomiting.  Marland Kitchen. oxycodone (ROXICODONE) 30 MG immediate release tablet Take 30  mg by mouth every 4 (four) hours as needed for pain (Dr Thyra Breed).   . oxyCODONE-acetaminophen (PERCOCET) 10-325 MG tablet Take 1 tablet by mouth every 6 (six) hours as needed for pain.  . pantoprazole (PROTONIX) 40 MG tablet Take 40 mg by mouth daily.   . promethazine (PHENERGAN) 25 MG suppository Place 1 suppository (25 mg total) rectally every 6 (six) hours as needed for nausea or vomiting.  Marland Kitchen tiZANidine (ZANAFLEX) 4 MG capsule Take 4 mg by mouth every 6 (six) hours.   No facility-administered encounter medications on file as of 08/17/2016.    ALLERGIES: Allergies  Allergen Reactions  . Amoxicillin-Pot Clavulanate Hives and Rash  . Celecoxib Hives and Rash  . Morphine And Related Itching and Swelling    Liquid only    VACCINATION STATUS: Immunization History  Administered Date(s) Administered  . Influenza Split 11/18/2012  .  Influenza, Seasonal, Injecte, Preservative Fre 12/12/2013  . Influenza,inj,Quad PF,36+ Mos 10/28/2015    HPI 56 year old female patient with multiple medical problems as above. She is being seen in f/u  for abnormal he fluctuating ACTH levels requested by Mechele Claude, MD. - She reports that she did have problems with fatigue, fluctuating weight, and fluctuation blood pressure for the last several weeks and requested for comprehensive blood work which on  May 16th 2018 showed ACTH of 7 which was repeated on subsequent labs on May 25 and returned high at 111 (normal 7-63). - Her 24 hour urine free cortisol on 07/25/2016 was normal. - She does not report any prior history of adrenal dysfunction, pituitary dysfunction, nor any history of prolonged exposure to steroids. - She takes multiple antipain medications related to her repeated  back surgery, including large dose of oxycodone. Patient still complains of uncontrolled pain throughout her body. - Her weight has changed by only 5 pounds over the last year. - She denies heat or cold intolerance. - He wears a large block brace related to her recent surgery.   Review of Systems  Constitutional: + Steady weight, + fatigue, no subjective hyperthermia, no subjective hypothermia Eyes: no blurry vision, no xerophthalmia ENT: no sore throat, no nodules palpated in throat, no dysphagia/odynophagia, no hoarseness Cardiovascular: no Chest Pain, no Shortness of Breath, no palpitations, no leg swelling Respiratory: no cough, no SOB Gastrointestinal: no Nausea/Vomiting/Diarhhea Musculoskeletal: + muscle/joint aches Skin: no rashes Neurological: no tremors, no numbness, no tingling, no dizziness Psychiatric: + depression, + anxiety  Objective:    BP (!) 172/96   Pulse 69   Ht 5\' 1"  (1.549 m)   Wt 142 lb (64.4 kg)   BMI 26.83 kg/m   Wt Readings from Last 3 Encounters:  08/17/16 142 lb (64.4 kg)  07/20/16 141 lb (64 kg)  06/21/16 143 lb (64.9  kg)    Physical Exam  Constitutional: Appropriate weight for height, not in acute distress Eyes: PERRLA, EOMI, no exophthalmos ENT: moist mucous membranes, no thyromegaly, no cervical lymphadenopathy Cardiovascular: normal precordial activity, Regular Rate and Rhythm, no Murmur/Rubs/Gallops Respiratory:  adequate breathing efforts, no gross chest deformity, Clear to auscultation bilaterally Gastrointestinal: abdomen soft, Non -tender, No distension, Bowel Sounds present Musculoskeletal:  Limited spinal flexibility/ multiple scars from prior back surgeries, no gross deformities, strength intact in all four extremities Skin: moist, warm, no rashes Neurological: no tremor with outstretched hands, Deep tendon reflexes normal in all four extremities.  CMP ( most recent) CMP     Component Value Date/Time   NA 141 06/22/2016 1040   K  4.8 06/22/2016 1040   CL 99 06/22/2016 1040   CO2 26 06/22/2016 1040   GLUCOSE 95 06/22/2016 1040   GLUCOSE 109 (H) 11/22/2015 1458   BUN 8 06/22/2016 1040   CREATININE 0.89 06/22/2016 1040   CREATININE 0.90 06/29/2015 1032   CALCIUM 9.5 06/22/2016 1040   PROT 6.3 04/26/2016 0918   ALBUMIN 3.7 04/26/2016 0918   AST 30 04/26/2016 0918   ALT 24 04/26/2016 0918   ALKPHOS 197 (H) 04/26/2016 0918   BILITOT 0.2 04/26/2016 0918   GFRNONAA 74 06/22/2016 1040   GFRNONAA 59 (L) 08/20/2012 1223   GFRAA 86 06/22/2016 1040   GFRAA 68 08/20/2012 1223     Lipid Panel ( most recent) Lipid Panel     Component Value Date/Time   CHOL 233 (H) 10/28/2015 1546   TRIG 139 10/28/2015 1546   TRIG 89 02/25/2014 1121   HDL 64 10/28/2015 1546   HDL 59 02/25/2014 1121   CHOLHDL 3.6 10/28/2015 1546   CHOLHDL 3.5 05/21/2012 1211   VLDL 22 05/21/2012 1211   LDLCALC 141 (H) 10/28/2015 1546   Results for PEGGE, CUMBERLEDGE (MRN [id number]) as of 08/17/2016 13:16  Ref. Range 07/25/2016 14:12  Cortisol (Ur), Free Latest Ref Range: 4.0 - 50.0 mcg/24 h 27.5  Results received  Latest Ref Range: 0.63 - 2.50 g/24 h 0.92    Assessment & Plan:   1. Abnormal endocrine laboratory test finding - I have reviewed her available records and clinically evaluated this patient. - Her records do not reflect specific adrenal dysfunction at this time. - Suspicion for adrenal insufficiency is low as well as for  Cushing's syndrome. - Her 24-hour urine free cortisol is 27.5 (normal 4-50) - ACTH  production is rhythmic by nature and as such by itself is not a reliable test to  Diagnose adrenal dysfunction.  - Review of her prior abdominal imaging showed that the adrenal glands are normal in appearance. - At this time she would not need imaging of pituitary/sella MRI, nor she needs any intervention regarding elevated ACTH.  - She will return  in 1 year with a.m. cortisol and CMP.   - I advised patient to maintain close follow up with Mechele Claude, MD for primary care needs. Follow up plan: Return in about 1 year (around 08/17/2017) for follow up with pre-visit labs.  Marquis Lunch, MD Phone: [phone]  Fax: [phone]   08/17/2016, 1:16 PM

## 2016-08-24 ENCOUNTER — Other Ambulatory Visit: Payer: Self-pay | Admitting: Orthopedic Surgery

## 2016-08-24 DIAGNOSIS — M4325 Fusion of spine, thoracolumbar region: Secondary | ICD-10-CM

## 2016-09-04 ENCOUNTER — Ambulatory Visit
Admission: RE | Admit: 2016-09-04 | Discharge: 2016-09-04 | Disposition: A | Discharge status: Home / Self Care | Payer: BLUE CROSS/BLUE SHIELD | Source: Ambulatory Visit | Attending: Orthopedic Surgery | Admitting: Orthopedic Surgery

## 2016-09-04 DIAGNOSIS — M4325 Fusion of spine, thoracolumbar region: Secondary | ICD-10-CM

## 2016-11-08 ENCOUNTER — Ambulatory Visit (INDEPENDENT_AMBULATORY_CARE_PROVIDER_SITE_OTHER): Payer: 59 | Admitting: Family Medicine

## 2016-11-08 ENCOUNTER — Encounter: Payer: Self-pay | Admitting: Family Medicine

## 2016-11-08 VITALS — BP 95/61 | HR 67 | Temp 97.4°F | Ht 61.0 in | Wt 148.0 lb

## 2016-11-08 DIAGNOSIS — I10 Essential (primary) hypertension: Secondary | ICD-10-CM | POA: Diagnosis not present

## 2016-11-08 DIAGNOSIS — R399 Unspecified symptoms and signs involving the genitourinary system: Secondary | ICD-10-CM

## 2016-11-08 DIAGNOSIS — Z23 Encounter for immunization: Secondary | ICD-10-CM

## 2016-11-08 LAB — URINALYSIS
BILIRUBIN UA: NEGATIVE
GLUCOSE, UA: NEGATIVE
Ketones, UA: NEGATIVE
Nitrite, UA: NEGATIVE
PH UA: 5.5 (ref 5.0–7.5)
SPEC GRAV UA: 1.025 (ref 1.005–1.030)
UUROB: 0.2 mg/dL (ref 0.2–1.0)

## 2016-11-08 NOTE — Progress Notes (Signed)
Subjective:  Patient ID: Amy Merritt, female    DOB: 04-Jan-1961  Age: 56 y.o. MRN: [id number]  CC: Hypertension (pt here today c/o BP being elevated for about 2-3 months now)   HPI Amy Merritt presents for  follow-up of hypertension. Patient has no history of headache chest pain or shortness of breath or recent cough. Patient also denies symptoms of TIA such as focal numbness or weakness. Patient checks  blood pressure at home. Readings recently Very high. She notes that it was as high as 198/99+5 days ago. She did not have any headache at that time.. Patient denies side effects from medication. States taking it regularly.   History Amy Merritt has a past medical history of Arthritis; Chronic kidney disease; Depression; DJD (degenerative joint disease); DJD (degenerative joint disease) of cervical spine; DJD (degenerative joint disease), lumbar; Headache(784.0); Hypertension; IBS (irritable bowel syndrome); Kidney stone; Lymphocytic colitis; Morgellons disease; and PONV (postoperative nausea and vomiting).   She has a past surgical history that includes Lumbar disc surgery; Tubal ligation; lithotrpsy; Cervical fusion; Colonoscopy (02/11/2003); Cardiac catheterization (N/A, 06/15/2015); and DG MYLEOGRAM LUMBAR SPINE ([facility] HX).   Her family history includes COPD in her paternal grandfather; Cancer in her father and maternal grandfather; Colitis in her father; Diabetes in her father and unknown relative; Heart attack in her mother; Heart attack (age of onset: 22) in her father; Heart disease in her paternal grandmother; Heart failure in her maternal grandmother and mother; Hypertension in her brother, father, and mother.She reports that she quit smoking about 6 years ago. She has never used smokeless tobacco. She reports that she does not drink alcohol or use drugs.  Current Outpatient Prescriptions on File Prior to Visit  Medication Sig Dispense Refill  . acetaminophen (TYLENOL) 500 MG tablet Take  1,000 mg by mouth 3 (three) times daily as needed for headache.    . ADDERALL XR 20 MG 24 hr capsule Take 20 mg by mouth daily.     Marland Kitchen ALPRAZolam (XANAX) 1 MG tablet Take 1 tablet (1 mg total) by mouth 4 (four) times daily as needed. ANXIETY 60 tablet 5  . Artificial Tear Ointment (DRY EYES OP) Place 1 drop into both eyes daily as needed (for dry eyes).    Marland Kitchen aspirin 81 MG EC tablet TAKE 1 BY MOUTH DAILY 90 tablet 0  . atorvastatin (LIPITOR) 40 MG tablet Take 1 tablet (40 mg total) by mouth daily. 90 tablet 3  . Coenzyme Q10 (CO Q-10) 100 MG CAPS Take 100 mg by mouth daily. 100 each 10  . [employer] OMEGA-3 KRILL OIL 300 MG CAPS Take 300 mg by mouth daily.     Marland Kitchen FLUoxetine (PROZAC) 10 MG capsule TAKE  BY MOUTH DAILY    . gabapentin (NEURONTIN) 400 MG capsule Take 400 mg by mouth 3 (three) times daily.     . Glucosamine-Chondroitin (GLUCOSAMINE CHONDR COMPLEX PO) Take 1 tablet by mouth 2 (two) times daily.     . naproxen (NAPROSYN) 500 MG tablet TK 1 T PO  BID  3  . nitroGLYCERIN (NITROSTAT) 0.4 MG SL tablet Place 1 tablet (0.4 mg total) under the tongue every 5 (five) minutes as needed for chest pain. 25 tablet 3  . ondansetron (ZOFRAN ODT) 4 MG disintegrating tablet Take 1 tablet (4 mg total) by mouth every 8 (eight) hours as needed for nausea or vomiting. 6 tablet 0  . oxycodone (ROXICODONE) 30 MG immediate release tablet Take 30 mg by mouth every 4 (four)  hours as needed for pain (Dr Thyra Breed).     . oxyCODONE-acetaminophen (PERCOCET) 10-325 MG tablet Take 1 tablet by mouth every 6 (six) hours as needed for pain.    . pantoprazole (PROTONIX) 40 MG tablet Take 40 mg by mouth daily.   1  . promethazine (PHENERGAN) 25 MG suppository Place 1 suppository (25 mg total) rectally every 6 (six) hours as needed for nausea or vomiting. 6 each 0  . tiZANidine (ZANAFLEX) 4 MG capsule Take 4 mg by mouth every 6 (six) hours.    Marland Kitchen trifluoperazine (STELAZINE) 1 MG tablet Take 1 mg by mouth as directed.     No  current facility-administered medications on file prior to visit.     ROS Review of Systems  Constitutional: Negative for activity change, appetite change and fever.  HENT: Negative for congestion, rhinorrhea and sore throat.   Eyes: Negative for visual disturbance.  Respiratory: Negative for cough and shortness of breath.   Cardiovascular: Negative for chest pain and palpitations.  Gastrointestinal: Negative for abdominal pain, diarrhea and nausea.  Genitourinary: Positive for dysuria and frequency.  Musculoskeletal: Negative for arthralgias and myalgias.    Objective:  BP 95/61   Pulse 67   Temp (!) 97.4 F (36.3 C) (Oral)   Ht  (1.549 m)   Wt 148 lb (67.1 kg)   BMI 27.96 kg/m   BP Readings from Last 3 Encounters:  11/08/16 95/61  08/17/16 (!) 172/96  07/20/16 (!) 154/91    Wt Readings from Last 3 Encounters:  11/08/16 148 lb (67.1 kg)  08/17/16 142 lb (64.4 kg)  07/20/16 141 lb (64 kg)     Physical Exam  Constitutional: She is oriented to person, place, and time. She appears well-developed and well-nourished. No distress.  HENT:  Head: Normocephalic and atraumatic.  Eyes: Pupils are equal, round, and reactive to light. Conjunctivae are normal.  Neck: Normal range of motion. Neck supple. No thyromegaly present.  Cardiovascular: Normal rate, regular rhythm and normal heart sounds.   No murmur heard. Pulmonary/Chest: Effort normal and breath sounds normal. No respiratory distress. She has no wheezes. She has no rales.  Abdominal: Soft. Bowel sounds are normal. She exhibits no distension. There is no tenderness.  Musculoskeletal: Normal range of motion.  Lymphadenopathy:    She has no cervical adenopathy.  Neurological: She is alert and oriented to person, place, and time.  Skin: Skin is warm and dry.  Psychiatric: She has a normal mood and affect. Her behavior is normal. Judgment and thought content normal.      Assessment & Plan:   Amy Merritt was seen  today for hypertension.  Diagnoses and all orders for this visit:  UTI symptoms -     Urine Culture -     Urinalysis  Need for immunization against influenza -     Flu Vaccine QUAD 36+ mos IM  Essential hypertension   Allergies as of 11/08/2016      Reactions   Amoxicillin-pot Clavulanate Hives, Rash   Celecoxib Hives, Rash   Morphine And Related Itching, Swelling   Liquid only      Medication List       Accurate as of 11/08/16  5:26 PM. Always use your most recent med list.          acetaminophen 500 MG tablet Commonly known as:  TYLENOL Take 1,000 mg by mouth 3 (three) times daily as needed for headache.   ADDERALL XR 20 MG 24 hr capsule Generic  drug:  amphetamine-dextroamphetamine Take 20 mg by mouth daily.   ALPRAZolam 1 MG tablet Commonly known as:  XANAX Take 1 tablet (1 mg total) by mouth 4 (four) times daily as needed. ANXIETY   aspirin 81 MG EC tablet TAKE 1 BY MOUTH DAILY   atorvastatin 40 MG tablet Commonly known as:  LIPITOR Take 1 tablet (40 mg total) by mouth daily.   Co Q-10 100 MG Caps Take 100 mg by mouth daily.   [employer] OMEGA-3 KRILL OIL 300 MG Caps Take 300 mg by mouth daily.   DRY EYES OP Place 1 drop into both eyes daily as needed (for dry eyes).   FLUoxetine 10 MG capsule Commonly known as:  PROZAC TAKE  BY MOUTH DAILY   gabapentin 400 MG capsule Commonly known as:  NEURONTIN Take 400 mg by mouth 3 (three) times daily.   GLUCOSAMINE CHONDR COMPLEX PO Take 1 tablet by mouth 2 (two) times daily.   naproxen 500 MG tablet Commonly known as:  NAPROSYN TK 1 T PO  BID   nitroGLYCERIN 0.4 MG SL tablet Commonly known as:  NITROSTAT Place 1 tablet (0.4 mg total) under the tongue every 5 (five) minutes as needed for chest pain.   ondansetron 4 MG disintegrating tablet Commonly known as:  ZOFRAN ODT Take 1 tablet (4 mg total) by mouth every 8 (eight) hours as needed for nausea or vomiting.   oxycodone 30 MG immediate release  tablet Commonly known as:  ROXICODONE Take 30 mg by mouth every 4 (four) hours as needed for pain (Dr Thyra Breed).   oxyCODONE-acetaminophen 10-325 MG tablet Commonly known as:  PERCOCET Take 1 tablet by mouth every 6 (six) hours as needed for pain.   pantoprazole 40 MG tablet Commonly known as:  PROTONIX Take 40 mg by mouth daily.   promethazine 25 MG suppository Commonly known as:  PHENERGAN Place 1 suppository (25 mg total) rectally every 6 (six) hours as needed for nausea or vomiting.   tiZANidine 4 MG capsule Commonly known as:  ZANAFLEX Take 4 mg by mouth every 6 (six) hours.   trifluoperazine 1 MG tablet Commonly known as:  STELAZINE Take 1 mg by mouth as directed.      Patient was reassured that her blood pressure is actually quite good today. I rechecked it myself showing 106/82. She should monitor it closely at home. Continue meds as is. Follow-up should it go over 140/90.   Follow-up: Return if symptoms worsen or fail to improve.  Mechele Claude, M.D.

## 2016-11-10 ENCOUNTER — Other Ambulatory Visit: Payer: Self-pay | Admitting: Family Medicine

## 2016-11-10 MED ORDER — CIPROFLOXACIN HCL 500 MG PO TABS: 500.0000 mg | ORAL_TABLET | Freq: Two times a day (BID) | ORAL | 0 refills | Status: DC

## 2016-11-12 ENCOUNTER — Encounter: Payer: Self-pay | Admitting: Family Medicine

## 2016-11-12 ENCOUNTER — Ambulatory Visit (INDEPENDENT_AMBULATORY_CARE_PROVIDER_SITE_OTHER): Payer: 59 | Admitting: Family Medicine

## 2016-11-12 VITALS — BP 169/93 | HR 80 | Temp 96.4°F | Ht 61.0 in | Wt 140.0 lb

## 2016-11-12 DIAGNOSIS — N10 Acute tubulo-interstitial nephritis: Secondary | ICD-10-CM

## 2016-11-12 LAB — URINE CULTURE

## 2016-11-12 MED ORDER — CEFTRIAXONE SODIUM 1 G IJ SOLR
1.0000 g | Freq: Once | INTRAMUSCULAR | Status: AC
  Administered 2016-11-12: 1 g via INTRAMUSCULAR

## 2016-11-12 MED ORDER — DIPHENOXYLATE-ATROPINE 2.5-0.025 MG PO TABS: 2.0000 | ORAL_TABLET | Freq: Four times a day (QID) | ORAL | 0 refills | Status: DC | PRN

## 2016-11-12 MED ORDER — PROMETHAZINE HCL 25 MG/ML IJ SOLN
25.0000 mg | Freq: Once | INTRAMUSCULAR | Status: AC
  Administered 2016-11-12: 25 mg via INTRAMUSCULAR

## 2016-11-12 MED ORDER — ONDANSETRON 8 MG PO TBDP: 8.0000 mg | ORAL_TABLET | Freq: Four times a day (QID) | ORAL | 1 refills | Status: DC | PRN

## 2016-11-12 NOTE — Progress Notes (Signed)
Subjective:  Patient ID: Amy Merritt, female    DOB: 11/16/1960  Age: 56 y.o. MRN: [id number]  CC: Emesis (x wednesday)   HPI Amy Merritt presents for 2 days of increasing nausea and vomiting. On the evening of October 3 she started complaining of nausea. For the next 2 days she had explosive diarrhea with vomiting. Husband is in today with her complaining of her not being able to even hold down diarrhea medicine Imodium without vomiting. Of note is that the patient had a urinary culture come back positive during this timeframe. She did get the medicine and start taking it 2 days ago right after the symptoms started. Husband is concerned about the flu. She does have body aches. No upper respiratory symptoms or cough.  Depression screen [facility] 2/9 11/12/2016 11/08/2016 08/17/2016  Decreased Interest 0 0 0  Down, Depressed, Hopeless 0 0 0  PHQ - 2 Score 0 0 0  Altered sleeping - - -  Tired, decreased energy - - -  Change in appetite - - -  Feeling bad or failure about yourself  - - -  Trouble concentrating - - -  Moving slowly or fidgety/restless - - -  Suicidal thoughts - - -  PHQ-9 Score - - -  Difficult doing work/chores - - -    History Marcy has a past medical history of Arthritis; Chronic kidney disease; Depression; DJD (degenerative joint disease); DJD (degenerative joint disease) of cervical spine; DJD (degenerative joint disease), lumbar; Headache(784.0); Hypertension; IBS (irritable bowel syndrome); Kidney stone; Lymphocytic colitis; Morgellons disease; and PONV (postoperative nausea and vomiting).   She has a past surgical history that includes Lumbar disc surgery; Tubal ligation; lithotrpsy; Cervical fusion; Colonoscopy (02/11/2003); Cardiac catheterization (N/A, 06/15/2015); and DG MYLEOGRAM LUMBAR SPINE ([facility] HX).   Her family history includes COPD in her paternal grandfather; Cancer in her father and maternal grandfather; Colitis in her father; Diabetes in her father and  unknown relative; Heart attack in her mother; Heart attack (age of onset: 30) in her father; Heart disease in her paternal grandmother; Heart failure in her maternal grandmother and mother; Hypertension in her brother, father, and mother.She reports that she quit smoking about 6 years ago. She has never used smokeless tobacco. She reports that she does not drink alcohol or use drugs.    ROS Review of Systems  Unable to perform ROS: Acuity of condition  Constitutional: Positive for activity change (bedridden for the last 2 days due to prostration from illness), appetite change (very poor) and chills. Negative for diaphoresis and fever.  HENT: Negative for congestion, rhinorrhea and sore throat.   Respiratory: Negative for cough.   Gastrointestinal: Positive for abdominal pain (diffuse), diarrhea, nausea and vomiting. Negative for blood in stool.  Genitourinary: Negative for decreased urine volume and difficulty urinating.       See history of present illness regarding urinary infection  Musculoskeletal: Positive for back pain (chronic. Has not been able to holdto nausea causing pain to spike).    Objective:  BP (!) 169/93 (BP Location: Right Arm, Cuff Size: Normal)   Pulse 80   Temp (!) 96.4 F (35.8 C) (Oral)   Ht  (1.549 m)   Wt 140 lb (63.5 kg)   BMI 26.45 kg/m   BP Readings from Last 3 Encounters:  11/12/16 (!) 169/93  11/08/16 95/61  08/17/16 (!) 172/96    Wt Readings from Last 3 Encounters:  11/12/16 140 lb (63.5 kg)  11/08/16 148 lb (  67.1 kg)  08/17/16 142 lb (64.4 kg)     Physical Exam  Constitutional: She is oriented to person, place, and time. She appears well-developed. She appears distressed.  Neurological: She is alert and oriented to person, place, and time.  Speech is slurred. However answers are appropriate      Assessment & Plan:   Noorah was seen today for emesis.  Diagnoses and all orders for this visit:  Acute pyelonephritis -      cefTRIAXone (ROCEPHIN) injection 1 g; Inject 1 g into the muscle once. -     promethazine (PHENERGAN) injection 25 mg; Inject 1 mL (25 mg total) into the muscle once.  Other orders -     diphenoxylate-atropine (LOMOTIL) 2.5-0.025 MG tablet; Take 2 tablets by mouth 4 (four) times daily as needed for diarrhea or loose stools. -     ondansetron (ZOFRAN-ODT) 8 MG disintegrating tablet; Take 1 tablet (8 mg total) by mouth every 6 (six) hours as needed for nausea or vomiting.       I have discontinued Ms. Curnutt's ondansetron. I am also having her start on diphenoxylate-atropine and ondansetron. Additionally, I am having her maintain her gabapentin, oxycodone, Glucosamine-Chondroitin (GLUCOSAMINE CHONDR COMPLEX PO), ADDERALL XR, [employer] OMEGA-3 KRILL OIL, nitroGLYCERIN, acetaminophen, Artificial Tear Ointment (DRY EYES OP), FLUoxetine, ALPRAZolam, pantoprazole, promethazine, naproxen, aspirin, Co Q-10, atorvastatin, tiZANidine, oxyCODONE-acetaminophen, trifluoperazine, and ciprofloxacin. We will continue to administer cefTRIAXone and promethazine.  Allergies as of 11/12/2016      Reactions   Amoxicillin-pot Clavulanate Hives, Rash   Celecoxib Hives, Rash   Morphine And Related Itching, Swelling   Liquid only      Medication List       Accurate as of 11/12/16 12:16 PM. Always use your most recent med list.          acetaminophen 500 MG tablet Commonly known as:  TYLENOL Take 1,000 mg by mouth 3 (three) times daily as needed for headache.   ADDERALL XR 20 MG 24 hr capsule Generic drug:  amphetamine-dextroamphetamine Take 20 mg by mouth daily.   ALPRAZolam 1 MG tablet Commonly known as:  XANAX Take 1 tablet (1 mg total) by mouth 4 (four) times daily as needed. ANXIETY   aspirin 81 MG EC tablet TAKE 1 BY MOUTH DAILY   atorvastatin 40 MG tablet Commonly known as:  LIPITOR Take 1 tablet (40 mg total) by mouth daily.   ciprofloxacin 500 MG tablet Commonly known as:  CIPRO Take 1  tablet (500 mg total) by mouth 2 (two) times daily.   Co Q-10 100 MG Caps Take 100 mg by mouth daily.   [employer] OMEGA-3 KRILL OIL 300 MG Caps Take 300 mg by mouth daily.   diphenoxylate-atropine 2.5-0.025 MG tablet Commonly known as:  LOMOTIL Take 2 tablets by mouth 4 (four) times daily as needed for diarrhea or loose stools.   DRY EYES OP Place 1 drop into both eyes daily as needed (for dry eyes).   FLUoxetine 10 MG capsule Commonly known as:  PROZAC TAKE  BY MOUTH DAILY   gabapentin 400 MG capsule Commonly known as:  NEURONTIN Take 400 mg by mouth 3 (three) times daily.   GLUCOSAMINE CHONDR COMPLEX PO Take 1 tablet by mouth 2 (two) times daily.   naproxen 500 MG tablet Commonly known as:  NAPROSYN TK 1 T PO  BID   nitroGLYCERIN 0.4 MG SL tablet Commonly known as:  NITROSTAT Place 1 tablet (0.4 mg total) under the tongue every 5 (five)  minutes as needed for chest pain.   ondansetron 8 MG disintegrating tablet Commonly known as:  ZOFRAN-ODT Take 1 tablet (8 mg total) by mouth every 6 (six) hours as needed for nausea or vomiting.   oxycodone 30 MG immediate release tablet Commonly known as:  ROXICODONE Take 30 mg by mouth every 4 (four) hours as needed for pain (Dr Thyra Breed).   oxyCODONE-acetaminophen 10-325 MG tablet Commonly known as:  PERCOCET Take 1 tablet by mouth every 6 (six) hours as needed for pain.   pantoprazole 40 MG tablet Commonly known as:  PROTONIX Take 40 mg by mouth daily.   promethazine 25 MG suppository Commonly known as:  PHENERGAN Place 1 suppository (25 mg total) rectally every 6 (six) hours as needed for nausea or vomiting.   tiZANidine 4 MG capsule Commonly known as:  ZANAFLEX Take 4 mg by mouth every 6 (six) hours.   trifluoperazine 1 MG tablet Commonly known as:  STELAZINE Take 1 mg by mouth as directed.        Follow-up: Return in about 2 days (around 11/14/2016), or if symptoms worsen or fail to improve.  Mechele Claude, M.D.

## 2016-12-13 ENCOUNTER — Ambulatory Visit: Payer: 59 | Admitting: Physical Therapy

## 2017-02-14 ENCOUNTER — Ambulatory Visit: Payer: 59 | Attending: Orthopaedic Surgery | Admitting: Physical Therapy

## 2017-02-14 ENCOUNTER — Encounter: Payer: Self-pay | Admitting: Physical Therapy

## 2017-02-14 ENCOUNTER — Other Ambulatory Visit: Payer: Self-pay

## 2017-02-14 DIAGNOSIS — M542 Cervicalgia: Secondary | ICD-10-CM | POA: Insufficient documentation

## 2017-02-14 DIAGNOSIS — M545 Low back pain: Secondary | ICD-10-CM | POA: Diagnosis present

## 2017-02-14 DIAGNOSIS — M6281 Muscle weakness (generalized): Secondary | ICD-10-CM | POA: Diagnosis present

## 2017-02-14 DIAGNOSIS — G8929 Other chronic pain: Secondary | ICD-10-CM | POA: Diagnosis present

## 2017-02-14 DIAGNOSIS — M546 Pain in thoracic spine: Secondary | ICD-10-CM | POA: Diagnosis present

## 2017-02-14 NOTE — Patient Instructions (Signed)
[facility]  DRY NEEDLING CONSENT FORM   Trigger point dry needling is a physical therapy approach to treat Myofascial Pain and Dysfunction.  Dry Needling (DN) is a valuable and effective way to deactivate myofascial trigger points (muscle knots). It is skilled intervention that uses a thin filiform needle to penetrate the skin and stimulate underlying myofascial trigger points, muscular, and connective tissues for the management of neuromusculoskeletal pain and movement impairments.  A local twitch response (LTR) will be elicited.  This can sometimes feel like a deep ache in the muscle during the procedure. Multiple trigger points in multiple muscles can be treated during each treatment.  No medication of any kind is injected.   As with any medical treatment and procedure, there are possible adverse events.  While significant adverse events are uncommon, they do sometimes occur and must be considered prior to giving consent.  1. Dry needling often causes a "post needling soreness".  There can be an increase in pain from a couple of hours to 2-3 days, followed by an improvement in the overall pain state. 2. Any time a needle is used there is a risk of infection.  However, we are using new, sterile, and disposable needles; infections are extremely rare. 3. There is a possibility that you may bleed or bruise.  You may feel tired and some nausea following treatment. 4. There is a rare possibility of a pneumothorax (air in the chest cavity). 5. Allergic reaction to nickel in the stainless steel needle. 6. If a nerve is touched, it may cause paresthesia (a prickling/shock sensation) which is usually brief, but may continue for a couple of days.  Following treatment stay hydrated.  Continue regular activities but not too vigorous initially after treatment for 24-48 hours.  Dry Needling is best when combined with other physical therapy interventions such as strengthening,  stretching and other therapeutic modalities.     PLEASE ANSWER THE FOLLOWING QUESTIONS:  Do you have a lack of sensation?   Y/N  Do you have a phobia or fear of needles  Y/N  Are you pregnant?    Y/N If yes:  How many weeks? _____  Do you have any implanted devices?  Y/N If yes:  Pacemaker/Spinal Cord         Stimulator/Deep Brain         Stimulator/Insulin          Pump/Other: ____________ Do you have any implants?   Y/N If yes:      Do you take any blood thinners?   Y/N If yes: Coumadin          (Warfarin)/Other:  Do you have a bleeding disorder?   Y/N If yes: What kind:   Do you take any immunosuppressants?  Y/N If yes:   What kind:   Do you take anti-inflammatories?   Y/N If yes: What kind:  Have you ever been diagnosed with Scoliosis? Y/N  Have you had back surgery?    Y/N If yes:         Laminectomy/Fusion/Other:   I have read, or had read to me, the above.  I have had the opportunity to ask any questions.  All of my questions have been answered to my satisfaction and I understand the risks involved with dry needling.  I consent to examination and treatment at [facility], including dry needling, of any and all of my involved and affected muscles.   Solon Palm, PT 02/14/17  11:27 AM; [facility] [address] [place], [place], [address] Phone: [phone]   Fax:  [phone]

## 2017-02-14 NOTE — Therapy (Signed)
[facility] [address] [place], [place], [address] Phone: [phone]   Fax:  [phone]  Physical Therapy Evaluation  Patient Details  Name: Amy Merritt MRN: [id number] Date of Birth: 06-27-1960 Referring Provider: Dr. Sharolyn Douglas   Encounter Date: 02/14/2017  PT End of Session - 02/14/17 1043    Visit Number  1    Number of Visits  12    Date for PT Re-Evaluation  03/28/17    PT Start Time  1044    PT Stop Time  1142    PT Time Calculation (min)  58 min    Activity Tolerance  Patient tolerated treatment well    Behavior During Therapy  [facility] for tasks assessed/performed       Past Medical History:  Diagnosis Date  . Arthritis   . Chronic kidney disease   . Depression   . DJD (degenerative joint disease)   . DJD (degenerative joint disease) of cervical spine   . DJD (degenerative joint disease), lumbar   . Headache(784.0)   . Hypertension   . IBS (irritable bowel syndrome)   . Kidney stone   . Lymphocytic colitis   . Morgellons disease   . PONV (postoperative nausea and vomiting)     Past Surgical History:  Procedure Laterality Date  . CARDIAC CATHETERIZATION N/A 06/15/2015   Procedure: Left Heart Cath and Coronary Angiography;  Surgeon: Kathleene Hazel, MD;  Location: [facility] INVASIVE CV LAB;  Service: Cardiovascular;  Laterality: N/A;  . CERVICAL FUSION     x 2  . COLONOSCOPY  02/11/2003  . DG MYLEOGRAM LUMBAR SPINE ([facility] HX)    . lithotrpsy    . LUMBAR DISC SURGERY     x 4- lower back with rods and screws  . TUBAL LIGATION      There were no vitals filed for this visit.   Subjective Assessment - 02/14/17 1045    Subjective  Patient has had multiple spinal surgeries and continues to have significant pain. The most recent surgeries were Feb 2018 and Dec 2017. Patient states she feels that something is wrong in her thoracic spine on R. Unable to sleep more than 2 hours a night. Also reports some muscle cramping.  She also reports her balance has been off the past two days and she feels like she is falling backwards.    Pertinent History  cervical fusions x 2, lumbar surgeries x 4 (fusions then screws/rods into Tspine), chronic kidney disease, coronary atherosclerosis, Morgollans disease    Limitations  Sitting    How long can you sit comfortably?  5 min or less    How long can you stand comfortably?  5 min or less    How long can you walk comfortably?  5 min or less    Diagnostic tests  mri, xrays    Patient Stated Goals  to reduce pain     Currently in Pain?  Yes    Pain Score  5  up to 9/10    Pain Location  Back    Pain Orientation  Right;Left;Lower    Pain Descriptors / Indicators  Stabbing    Pain Type  Chronic pain    Pain Radiating Towards  RLE to foot intermittently    Pain Onset  More than a month ago    Pain Frequency  Constant    Aggravating Factors   any position     Pain Relieving Factors  pain meds, breathing techniques, heat, TENs  sometimes    Effect of Pain on Daily Activities  limited with all ADLS, sleep    Multiple Pain Sites  Yes    Pain Score  6    Pain Location  Neck    Pain Orientation  Right    Pain Descriptors / Indicators  Aching    Pain Type  Chronic pain    Pain Radiating Towards  into RUE to hand    Pain Onset  More than a month ago    Pain Frequency  Constant    Aggravating Factors   turning    Pain Relieving Factors  meds, heat    Effect of Pain on Daily Activities  unable to open bottles         [facility] PT Assessment - 02/14/17 0001      Assessment   Medical Diagnosis  Cervical pain, lumbar pain    Referring Provider  Dr. Sharolyn DouglasMax Cohen      Precautions   Precautions  Back;Cervical    Precaution Comments  cervical fusion x 2, lumbar x 4      Balance Screen   Has the patient fallen in the past 6 months  Yes    How many times?  2    Has the patient had a decrease in activity level because of a fear of falling?   Yes    Is the patient reluctant to leave  their home because of a fear of falling?   Yes      Prior Function   Level of Independence  Independent    Vocation  Retired      Metallurgistosture/Postural Control   Posture/Postural Control  Postural limitations    Postural Limitations  Rounded Shoulders;Forward head;Decreased lumbar lordosis;Posterior pelvic tilt    Posture Comments  tight R QL, depressed  R shoulder; Bil genu varus      ROM / Strength   AROM / PROM / Strength  AROM;Strength      AROM   AROM Assessment Site  Cervical    Cervical Flexion  12    Cervical Extension  28    Cervical - Right Side Bend  17    Cervical - Left Side Bend  18    Cervical - Right Rotation  50% decreased    Cervical - Left Rotation  50% decreased      Strength   Overall Strength Comments  Bil Shoulders grossly 5/5; cervical 4/5, Bil hip flex 5-/5, knee ext 5/5, flex R 4+/5, L 4-/5; Bil DF 5/5; Bil hip ABD 5/5 sitting, ADD 4+/5 sitting      Palpation   Palpation comment  marked tenderness of r thoracic paraspinals, bil lumbar and QL, Bil gluteals patient may have lipoma on L SIJ line             Objective measurements completed on examination: See above findings.      [facility] Adult PT Treatment/Exercise - 02/14/17 0001      Modalities   Modalities  Electrical Stimulation;Moist Heat      Moist Heat Therapy   Number Minutes Moist Heat  15 Minutes    Moist Heat Location  Lumbar Spine;Other (comment) thoracic spine      Electrical Stimulation   Electrical Stimulation Location  Bil UT/lumbar paraspinals IFC 80-150 Hz x 15 min to tolerance    Electrical Stimulation Goals  Pain             PT Education - 02/14/17 1130    Education  provided  Yes    Education Details  DN education    Person(s) Educated  Patient    Methods  Explanation;Handout    Comprehension  Verbalized understanding          PT Long Term Goals - 02/14/17 1200      PT LONG TERM GOAL #1   Title  Pt will be independent with HEP    Time  6    Period   Weeks    Status  New      PT LONG TERM GOAL #2   Title  Patient to report decreased pain by 50% in neck and back with ADLS.    Time  6    Period  Weeks    Status  New      PT LONG TERM GOAL #3   Title  Patient able to sleep for 5 hours without waking from pain.    Time  6    Period  Weeks    Status  New             Plan - 02/14/17 1137    Clinical Impression Statement  Patient presents for a high complexity evaluation for cervical, thoracic and lumbar pain. She has had multiple spinal surgeries. She has decreased ROM and strength and marked tone throughout her body from subocciptials to shoulders, along back and into hips. Patient will benefit from skilled PT to address these deficits.    History and Personal Factors relevant to plan of care:  cervical fusions x 2, lumbar surgeries x 4 (fusions then screws/rods into Tspine), chronic kidney disease, coronary atherosclerosis, Morgollans disease    Clinical Presentation  Evolving    Clinical Presentation due to:  worsening symptoms    Clinical Decision Making  High    Rehab Potential  Good    Clinical Impairments Affecting Rehab Potential  see above    PT Frequency  2x / week    PT Duration  6 weeks    PT Treatment/Interventions  ADLs/Self Care Home Management;Electrical Stimulation;Moist Heat;Ultrasound;Therapeutic exercise;Balance training;Neuromuscular re-education;Patient/family education;Dry needling;Taping    PT Next Visit Plan  DN to cervical, thoracic,lumbar, QL, gluteals, IASTM to same, assess balance when tolerated.    Consulted and Agree with Plan of Care  Patient       Patient will benefit from skilled therapeutic intervention in order to improve the following deficits and impairments:  Decreased activity tolerance, Decreased strength, Pain, Decreased balance, Decreased range of motion, Postural dysfunction  Visit Diagnosis: Cervicalgia - Plan: PT plan of care cert/re-cert  Pain in thoracic spine - Plan: PT plan  of care cert/re-cert  Chronic bilateral low back pain, with sciatica presence unspecified - Plan: PT plan of care cert/re-cert     Problem List Patient Active Problem List   Diagnosis Date Noted  . Abnormal endocrine laboratory test finding 07/20/2016  . Coronary artery disease involving native coronary artery of native heart with angina pectoris (HCC)   . Frequency of urination 08/20/2012  . Hypokalemia 08/04/2012  . Anemia 08/04/2012  . HTN (hypertension) 05/21/2012  . Back pain, chronic 05/21/2012  . ADD (attention deficit disorder) 05/21/2012  . HLD (hyperlipidemia) 05/21/2012  . Depression 05/21/2012  . Insomnia 05/21/2012    Solon Palm PT 02/14/2017, 12:12 PM  [facility] [address] [place], [place], [address] Phone: [phone]   Fax:  [phone]  Name: Amy Merritt MRN: [id number] Date of Birth: 1960-08-03

## 2017-02-20 ENCOUNTER — Ambulatory Visit: Payer: 59 | Admitting: Physical Therapy

## 2017-02-20 DIAGNOSIS — M542 Cervicalgia: Secondary | ICD-10-CM | POA: Diagnosis not present

## 2017-02-20 DIAGNOSIS — G8929 Other chronic pain: Secondary | ICD-10-CM

## 2017-02-20 DIAGNOSIS — M546 Pain in thoracic spine: Secondary | ICD-10-CM

## 2017-02-20 DIAGNOSIS — M545 Low back pain: Secondary | ICD-10-CM

## 2017-02-20 NOTE — Patient Instructions (Addendum)
[facility]  DRY NEEDLING CONSENT FORM   Trigger point dry needling is a physical therapy approach to treat Myofascial Pain and Dysfunction.  Dry Needling (DN) is a valuable and effective way to deactivate myofascial trigger points (muscle knots). It is skilled intervention that uses a thin filiform needle to penetrate the skin and stimulate underlying myofascial trigger points, muscular, and connective tissues for the management of neuromusculoskeletal pain and movement impairments.  A local twitch response (LTR) will be elicited.  This can sometimes feel like a deep ache in the muscle during the procedure. Multiple trigger points in multiple muscles can be treated during each treatment.  No medication of any kind is injected.   As with any medical treatment and procedure, there are possible adverse events.  While significant adverse events are uncommon, they do sometimes occur and must be considered prior to giving consent.  1. Dry needling often causes a "post needling soreness".  There can be an increase in pain from a couple of hours to 2-3 days, followed by an improvement in the overall pain state. 2. Any time a needle is used there is a risk of infection.  However, we are using new, sterile, and disposable needles; infections are extremely rare. 3. There is a possibility that you may bleed or bruise.  You may feel tired and some nausea following treatment. 4. There is a rare possibility of a pneumothorax (air in the chest cavity). 5. Allergic reaction to nickel in the stainless steel needle. 6. If a nerve is touched, it may cause paresthesia (a prickling/shock sensation) which is usually brief, but may continue for a couple of days.  Following treatment stay hydrated.  Continue regular activities but not too vigorous initially after treatment for 24-48 hours.  Dry Needling is best when combined with other physical therapy interventions such as strengthening,  stretching and other therapeutic modalities.     PLEASE ANSWER THE FOLLOWING QUESTIONS:  Do you have a lack of sensation?   Y/N  Do you have a phobia or fear of needles  Y/N  Are you pregnant?    Y/N If yes:  How many weeks? _____  Do you have any implanted devices?  Y/N If yes:  Pacemaker/Spinal Cord         Stimulator/Deep Brain         Stimulator/Insulin          Pump/Other: ____________ Do you have any implants?   Y/N If yes:      Do you take any blood thinners?   Y/N If yes: Coumadin          (Warfarin)/Other:  Do you have a bleeding disorder?   Y/N If yes: What kind:   Do you take any immunosuppressants?  Y/N If yes:   What kind:   Do you take anti-inflammatories?   Y/N If yes: What kind:  Have you ever been diagnosed with Scoliosis? Y/N  Have you had back surgery?    Y/N If yes:         Laminectomy/Fusion/Other:   I have read, or had read to me, the above.  I have had the opportunity to ask any questions.  All of my questions have been answered to my satisfaction and I understand the risks involved with dry needling.  I consent to examination and treatment at [facility], including dry needling, of any and all of my involved and affected muscles.   Solon Palm, PT 02/20/17  12:29 PM; [facility] [address] [place], [place], [address] Phone: [phone]   Fax:  [phone]

## 2017-02-20 NOTE — Therapy (Signed)
[facility] [place], [place], [address] Phone: [phone]   Fax:  [phone]  Physical Therapy Treatment  Patient Details  Name: Amy Merritt MRN: [id number] Date of Birth: 1960-02-27 Referring Provider: Dr. Rennis Harding   Encounter Date: 02/20/2017  PT End of Session - 02/20/17 1118    Visit Number  2    Number of Visits  12    Date for PT Re-Evaluation  03/28/17    PT Start Time  1118    PT Stop Time  1220    PT Time Calculation (min)  62 min    Activity Tolerance  Patient tolerated treatment well    Behavior During Therapy  [facility] for tasks assessed/performed       Past Medical History:  Diagnosis Date  . Arthritis   . Chronic kidney disease   . Depression   . DJD (degenerative joint disease)   . DJD (degenerative joint disease) of cervical spine   . DJD (degenerative joint disease), lumbar   . Headache(784.0)   . Hypertension   . IBS (irritable bowel syndrome)   . Kidney stone   . Lymphocytic colitis   . Morgellons disease   . PONV (postoperative nausea and vomiting)     Past Surgical History:  Procedure Laterality Date  . CARDIAC CATHETERIZATION N/A 06/15/2015   Procedure: Left Heart Cath and Coronary Angiography;  Surgeon: Burnell Blanks, MD;  Location: [facility];  Service: Cardiovascular;  Laterality: N/A;  . CERVICAL FUSION     x 2  . COLONOSCOPY  02/11/2003  . DG MYLEOGRAM LUMBAR SPINE ([place] HX)    . lithotrpsy    . LUMBAR DISC SURGERY     x 4- lower back with rods and screws  . TUBAL LIGATION      There were no vitals filed for this visit.  Subjective Assessment - 02/20/17 1118    Subjective  Patient states it has been a rough week regarding pain.     Pertinent History  cervical fusions x 2, lumbar surgeries x 4 (fusions then screws/rods into Tspine), chronic kidney disease, coronary atherosclerosis, Morgollans disease    How long can you sit comfortably?  5 min or less    How  long can you stand comfortably?  5 min or less    How long can you walk comfortably?  5 min or less    Diagnostic tests  mri, xrays    Patient Stated Goals  to reduce pain     Currently in Pain?  Yes    Pain Score  7     Pain Location  Back    Pain Orientation  Right;Left;Lower    Pain Type  Chronic pain    Pain Score  7    Pain Location  Neck    Pain Orientation  Right    Pain Descriptors / Indicators  Aching    Pain Type  Chronic pain                      [facility] Adult PT Treatment/Exercise - 02/20/17 0001      Modalities   Modalities  Electrical Stimulation;Moist Heat      Moist Heat Therapy   Number Minutes Moist Heat  15 Minutes    Moist Heat Location  Lumbar Spine;Other (comment);Cervical thoracic spine      Electrical Stimulation   Electrical Stimulation Location  Bil UT/lumbar paraspinals IFC 80-150 Hz x 15 min  to tolerance    Electrical Stimulation Goals  Pain      Manual Therapy   Manual Therapy  Soft tissue mobilization    Soft tissue mobilization  to Bil UT, cervical, thoracic and lumbar paraspinals and gluteals       Trigger Point Dry Needling - 02/20/17 1218    Consent Given?  Yes    Education Handout Provided  Yes    Muscles Treated Upper Body  Longissimus;Upper trapezius;Levator scapulae ES C2-4 bil, R T7-9, L4-5 bil    Muscles Treated Lower Body  Gluteus minimus;Gluteus maximus;Piriformis    Upper Trapezius Response  Twitch reponse elicited;Palpable increased muscle length    Levator Scapulae Response  Twitch response elicited;Palpable increased muscle length    Longissimus Response  Twitch response elicited;Palpable increased muscle length    Gluteus Maximus Response  Twitch response elicited;Palpable increased muscle length    Gluteus Minimus Response  Twitch response elicited;Palpable increased muscle length    Piriformis Response  Twitch response elicited;Palpable increased muscle length           PT Education - 02/20/17 1228     Education provided  Yes    Education Details  DN education and aftercare    Person(s) Educated  Patient    Methods  Explanation;Handout    Comprehension  Verbalized understanding;Returned demonstration          PT Long Term Goals - 02/14/17 1200      PT LONG TERM GOAL #1   Title  Pt will be independent with HEP    Time  6    Period  Weeks    Status  New      PT LONG TERM GOAL #2   Title  Patient to report decreased pain by 50% in neck and back with ADLS.    Time  6    Period  Weeks    Status  New      PT LONG TERM GOAL #3   Title  Patient able to sleep for 5 hours without waking from pain.    Time  6    Period  Weeks    Status  New            Plan - 02/20/17 1229    Clinical Impression Statement  Patient did very well with DN today reporting decreased pain at end of treatment. Normal response to modalities. No goals met as only second visit.    Rehab Potential  Good    Clinical Impairments Affecting Rehab Potential  see above    PT Frequency  2x / week    PT Duration  6 weeks    PT Treatment/Interventions  ADLs/Self Care Home Management;Electrical Stimulation;Moist Heat;Ultrasound;Therapeutic exercise;Balance training;Neuromuscular re-education;Patient/family education;Dry needling;Taping    PT Next Visit Plan  Assess DN to cervical, thoracic,lumbar, gluteals; continue prn and QL, IASTM to same, assess balance when tolerated.    Consulted and Agree with Plan of Care  Patient       Patient will benefit from skilled therapeutic intervention in order to improve the following deficits and impairments:  Decreased activity tolerance, Decreased strength, Pain, Decreased balance, Decreased range of motion, Postural dysfunction  Visit Diagnosis: Cervicalgia  Pain in thoracic spine  Chronic bilateral low back pain, with sciatica presence unspecified     Problem List Patient Active Problem List   Diagnosis Date Noted  . Abnormal endocrine laboratory test finding  07/20/2016  . Coronary artery disease involving native coronary artery of native heart with  angina pectoris ([place])   . Frequency of urination 08/20/2012  . Hypokalemia 08/04/2012  . Anemia 08/04/2012  . HTN (hypertension) 05/21/2012  . Back pain, chronic 05/21/2012  . ADD (attention deficit disorder) 05/21/2012  . HLD (hyperlipidemia) 05/21/2012  . Depression 05/21/2012  . Insomnia 05/21/2012    Madelyn Flavors PT 02/20/2017, 12:32 PM  [facility] [place], [place], [address] Phone: [phone]   Fax:  [phone]  Name: Amy Merritt MRN: [id number] Date of Birth: [birth year]

## 2017-02-24 ENCOUNTER — Ambulatory Visit: Payer: 59 | Admitting: Physical Therapy

## 2017-02-24 DIAGNOSIS — M545 Low back pain: Secondary | ICD-10-CM

## 2017-02-24 DIAGNOSIS — M542 Cervicalgia: Secondary | ICD-10-CM | POA: Diagnosis not present

## 2017-02-24 DIAGNOSIS — M546 Pain in thoracic spine: Secondary | ICD-10-CM

## 2017-02-24 DIAGNOSIS — G8929 Other chronic pain: Secondary | ICD-10-CM

## 2017-02-24 NOTE — Therapy (Signed)
[facility] [address] [place], [place], [address] Phone: [phone]   Fax:  [phone]  Physical Therapy Treatment  Patient Details  Name: Amy Merritt MRN: [id number] Date of Birth: 10-10-1960 Referring Provider: Dr. Sharolyn DouglasMax Cohen   Encounter Date: 02/24/2017  PT End of Session - 02/24/17 1222    Visit Number  3    Number of Visits  12    Date for PT Re-Evaluation  03/28/17    PT Start Time  1130 delay due to previous pt    PT Stop Time  1228    PT Time Calculation (min)  58 min    Activity Tolerance  Patient tolerated treatment well;Patient limited by pain limited in some areas    Behavior During Therapy  [facility] for tasks assessed/performed       Past Medical History:  Diagnosis Date  . Arthritis   . Chronic kidney disease   . Depression   . DJD (degenerative joint disease)   . DJD (degenerative joint disease) of cervical spine   . DJD (degenerative joint disease), lumbar   . Headache(784.0)   . Hypertension   . IBS (irritable bowel syndrome)   . Kidney stone   . Lymphocytic colitis   . Morgellons disease   . PONV (postoperative nausea and vomiting)     Past Surgical History:  Procedure Laterality Date  . CARDIAC CATHETERIZATION N/A 06/15/2015   Procedure: Left Heart Cath and Coronary Angiography;  Surgeon: Kathleene Hazelhristopher D McAlhany, MD;  [facility];  Service: Cardiovascular;  Laterality: N/A;  . CERVICAL FUSION     x 2  . COLONOSCOPY  02/11/2003  . DG MYLEOGRAM LUMBAR SPINE ([facility] HX)    . lithotrpsy    . LUMBAR DISC SURGERY     x 4- lower back with rods and screws  . TUBAL LIGATION      There were no vitals filed for this visit.  Subjective Assessment - 02/24/17 1132    Subjective  Patient reports she did great after DN session and was able to put holiday decorations away that had been up for over a year. She had increased pain the day after all the activity, but still reports improvement.     Pertinent History  cervical fusions x 2, lumbar surgeries x 4 (fusions then screws/rods into Tspine), chronic kidney disease, coronary atherosclerosis, Morgollans disease, arachnoid web T spine    How long can you sit comfortably?  5 min or less    How long can you stand comfortably?  5 min or less    How long can you walk comfortably?  5 min or less    Diagnostic tests  mri, xrays    Patient Stated Goals  to reduce pain     Currently in Pain?  Yes    Pain Score  3     Pain Location  Back    Pain Orientation  Right;Left;Lower    Multiple Pain Sites  Yes    Pain Score  5    Pain Location  Neck    Pain Orientation  Left    Pain Descriptors / Indicators  Aching                      [facility] Adult PT Treatment/Exercise - 02/24/17 0001      Modalities   Modalities  Electrical Stimulation;Moist Heat      Moist Heat Therapy   Number Minutes Moist Heat  15  Minutes    Moist Heat Location  Cervical;Other (comment) thoracic      Electrical Stimulation   Electrical Stimulation Location  Bil UT/thoracic paraspinals IFC 80-150 Hz x 15 min to tolerance    Electrical Stimulation Goals  Pain      Manual Therapy   Manual Therapy  Soft tissue mobilization    Soft tissue mobilization  to Bil UT, lats, paraspinals; IASTM to thoracic paraspinals left (unable to tolerate on R)       Trigger Point Dry Needling - 02/24/17 1218    Consent Given?  Yes    Muscles Treated Upper Body  Upper trapezius;Rhomboids;Longissimus Bil and lats, ES T1-2; T7-9; L4-5, S1/2 Bil    Upper Trapezius Response  Twitch reponse elicited;Palpable increased muscle length    Rhomboids Response  Twitch response elicited;Palpable increased muscle length    Longissimus Response  Twitch response elicited;Palpable increased muscle length                PT Long Term Goals - 02/14/17 1200      PT LONG TERM GOAL #1   Title  Pt will be independent with HEP    Time  6    Period  Weeks    Status  New       PT LONG TERM GOAL #2   Title  Patient to report decreased pain by 50% in neck and back with ADLS.    Time  6    Period  Weeks    Status  New      PT LONG TERM GOAL #3   Title  Patient able to sleep for 5 hours without waking from pain.    Time  6    Period  Weeks    Status  New            Plan - 02/24/17 1223    Clinical Impression Statement  Patient is progressing well toward pain reduction goals. She has had a very good response to DN overall.    PT Treatment/Interventions  ADLs/Self Care Home Management;Electrical Stimulation;Moist Heat;Ultrasound;Therapeutic exercise;Balance training;Neuromuscular re-education;Patient/family education;Dry needling;Taping    PT Next Visit Plan  Assess DN to cervical, thoracic,lumbar, gluteals; continue prn and QL, IASTM to same, assess balance when tolerated.       Patient will benefit from skilled therapeutic intervention in order to improve the following deficits and impairments:  Decreased activity tolerance, Decreased strength, Pain, Decreased balance, Decreased range of motion, Postural dysfunction  Visit Diagnosis: Cervicalgia  Pain in thoracic spine  Chronic bilateral low back pain, with sciatica presence unspecified     Problem List Patient Active Problem List   Diagnosis Date Noted  . Abnormal endocrine laboratory test finding 07/20/2016  . Coronary artery disease involving native coronary artery of native heart with angina pectoris ([facility])   . Frequency of urination 08/20/2012  . Hypokalemia 08/04/2012  . Anemia 08/04/2012  . HTN (hypertension) 05/21/2012  . Back pain, chronic 05/21/2012  . ADD (attention deficit disorder) 05/21/2012  . HLD (hyperlipidemia) 05/21/2012  . Depression 05/21/2012  . Insomnia 05/21/2012    Solon Palm PT 02/24/2017, 12:26 PM  [facility] [address] [place], [place], [address] Phone: [phone]   Fax:  [phone]  Name: Amy Merritt MRN: [id number] Date of Birth: 04/02/1960

## 2017-02-27 ENCOUNTER — Ambulatory Visit: Payer: 59 | Admitting: Physical Therapy

## 2017-02-27 DIAGNOSIS — M545 Low back pain: Secondary | ICD-10-CM

## 2017-02-27 DIAGNOSIS — M542 Cervicalgia: Secondary | ICD-10-CM

## 2017-02-27 DIAGNOSIS — M546 Pain in thoracic spine: Secondary | ICD-10-CM

## 2017-02-27 DIAGNOSIS — G8929 Other chronic pain: Secondary | ICD-10-CM

## 2017-02-27 NOTE — Therapy (Signed)
[facility] [address] [place], [place], [address] Phone: [phone]   Fax:  [phone]  Physical Therapy Treatment  Patient Details  Name: Amy Merritt MRN: [id number] Date of Birth: Mar 24, 1960 Referring Provider: Dr. Sharolyn Douglas   Encounter Date: 02/27/2017  PT End of Session - 02/27/17 1430    Visit Number  4    Number of Visits  12    Date for PT Re-Evaluation  03/28/17    PT Start Time  1430    PT Stop Time  1532    PT Time Calculation (min)  62 min    Activity Tolerance  Patient tolerated treatment well    Behavior During Therapy  [facility] for tasks assessed/performed       Past Medical History:  Diagnosis Date  . Arthritis   . Chronic kidney disease   . Depression   . DJD (degenerative joint disease)   . DJD (degenerative joint disease) of cervical spine   . DJD (degenerative joint disease), lumbar   . Headache(784.0)   . Hypertension   . IBS (irritable bowel syndrome)   . Kidney stone   . Lymphocytic colitis   . Morgellons disease   . PONV (postoperative nausea and vomiting)     Past Surgical History:  Procedure Laterality Date  . CARDIAC CATHETERIZATION N/A 06/15/2015   Procedure: Left Heart Cath and Coronary Angiography;  Surgeon: Kathleene Hazel, MD;  Location: [facility];  Service: Cardiovascular;  Laterality: N/A;  . CERVICAL FUSION     x 2  . COLONOSCOPY  02/11/2003  . DG MYLEOGRAM LUMBAR SPINE ([facility] HX)    . lithotrpsy    . LUMBAR DISC SURGERY     x 4- lower back with rods and screws  . TUBAL LIGATION      There were no vitals filed for this visit.  Subjective Assessment - 02/27/17 1430    Subjective  Patient fell going [place] the church steps yesterday. Her left ribs are tender and she has some bruises on her legs. Overall though, she feels like she is doing well. She was able to drive for 6.96 hours before back spasmed.     Pertinent History  cervical fusions x 2, lumbar surgeries x 4  (fusions then screws/rods into Tspine), chronic kidney disease, coronary atherosclerosis, Morgollans disease, arachnoid web T spine    Currently in Pain?  Yes    Pain Score  6     Pain Location  Back    Pain Orientation  Right;Left;Lower    Pain Descriptors / Indicators  Aching    Pain Type  Chronic pain    Pain Onset  More than a month ago    Pain Frequency  Constant    Pain Score  3    Pain Location  Neck    Pain Orientation  Left;Right    Pain Descriptors / Indicators  Aching    Pain Type  Chronic pain    Pain Onset  More than a month ago                      [facility] Adult PT Treatment/Exercise - 02/27/17 0001      Modalities   Modalities  Electrical Stimulation;Moist Heat      Moist Heat Therapy   Number Minutes Moist Heat  15 Minutes    Moist Heat Location  Hip;Lumbar Spine gluteals and Tspine      Insurance claims handler  Stimulation Location  Bil Tspine to gluteals IFC 80-150 Hz x 15 mn    Electrical Stimulation Goals  Pain      Manual Therapy   Manual Therapy  Soft tissue mobilization    Soft tissue mobilization  to Bil UT, cervical, thoracic and lumbar paraspinals, gluteals       Trigger Point Dry Needling - 02/27/17 1519    Muscles Treated Upper Body  Upper trapezius;Longissimus ES C2, C4,5 R C5 L; R lower traps    Muscles Treated Lower Body  Gluteus minimus;Gluteus maximus;Piriformis Bil and glut med    Upper Trapezius Response  Twitch reponse elicited;Palpable increased muscle length Bil    Longissimus Response  Twitch response elicited;Palpable increased muscle length    Gluteus Maximus Response  Twitch response elicited;Palpable increased muscle length    Gluteus Minimus Response  Twitch response elicited;Palpable increased muscle length    Piriformis Response  Twitch response elicited;Palpable increased muscle length                PT Long Term Goals - 02/14/17 1200      PT LONG TERM GOAL #1   Title  Pt will be  independent with HEP    Time  6    Period  Weeks    Status  New      PT LONG TERM GOAL #2   Title  Patient to report decreased pain by 50% in neck and back with ADLS.    Time  6    Period  Weeks    Status  New      PT LONG TERM GOAL #3   Title  Patient able to sleep for 5 hours without waking from pain.    Time  6    Period  Weeks    Status  New            Plan - 02/27/17 1524    Clinical Impression Statement  Patient tolerated DN well today. Her UTs were tight from her fall but she had minimal TPs. She had ++twitch response in R multifidi, Bil gluteus med, max and piriformis today. Tolerated STW much better due to taking muscle relaxor prior to session. Goals are ongoing.    Rehab Potential  Good    PT Frequency  2x / week    PT Duration  6 weeks    PT Treatment/Interventions  ADLs/Self Care Home Management;Electrical Stimulation;Moist Heat;Ultrasound;Therapeutic exercise;Balance training;Neuromuscular re-education;Patient/family education;Dry needling;Taping    PT Next Visit Plan  Assess DN to cervical, thoracic,lumbar, gluteals; continue prn and QL, IASTM to same, assess balance when tolerated. Add TE as tolerated    Consulted and Agree with Plan of Care  Patient       Patient will benefit from skilled therapeutic intervention in order to improve the following deficits and impairments:  Decreased activity tolerance, Decreased strength, Pain, Decreased balance, Decreased range of motion, Postural dysfunction  Visit Diagnosis: Cervicalgia  Pain in thoracic spine  Chronic bilateral low back pain, with sciatica presence unspecified     Problem List Patient Active Problem List   Diagnosis Date Noted  . Abnormal endocrine laboratory test finding 07/20/2016  . Coronary artery disease involving native coronary artery of native heart with angina pectoris ([facility])   . Frequency of urination 08/20/2012  . Hypokalemia 08/04/2012  . Anemia 08/04/2012  . HTN (hypertension)  05/21/2012  . Back pain, chronic 05/21/2012  . ADD (attention deficit disorder) 05/21/2012  . HLD (hyperlipidemia) 05/21/2012  . Depression 05/21/2012  .  Insomnia 05/21/2012    Solon PalmJulie Barbar Brede PT 02/27/2017, 3:29 PM  [facility] [address] [place], [place], [address] Phone: [phone]   Fax:  [phone]  Name: Amy Merritt MRN: [id number] Date of Birth: [birth year]

## 2017-02-28 ENCOUNTER — Encounter: Payer: 59 | Admitting: Physical Therapy

## 2017-03-03 ENCOUNTER — Encounter: Payer: 59 | Admitting: Physical Therapy

## 2017-03-06 ENCOUNTER — Other Ambulatory Visit: Payer: Self-pay | Admitting: Family Medicine

## 2017-03-06 ENCOUNTER — Ambulatory Visit: Payer: 59 | Admitting: Physical Therapy

## 2017-03-06 ENCOUNTER — Encounter: Payer: Self-pay | Admitting: Family Medicine

## 2017-03-06 ENCOUNTER — Ambulatory Visit: Payer: 59 | Admitting: Family Medicine

## 2017-03-06 VITALS — BP 134/76 | HR 77 | Temp 97.0°F | Ht 61.0 in | Wt 165.0 lb

## 2017-03-06 DIAGNOSIS — M542 Cervicalgia: Secondary | ICD-10-CM | POA: Diagnosis not present

## 2017-03-06 DIAGNOSIS — G8929 Other chronic pain: Secondary | ICD-10-CM

## 2017-03-06 DIAGNOSIS — R05 Cough: Secondary | ICD-10-CM | POA: Diagnosis not present

## 2017-03-06 DIAGNOSIS — R300 Dysuria: Secondary | ICD-10-CM

## 2017-03-06 DIAGNOSIS — M546 Pain in thoracic spine: Secondary | ICD-10-CM

## 2017-03-06 DIAGNOSIS — J9801 Acute bronchospasm: Secondary | ICD-10-CM | POA: Diagnosis not present

## 2017-03-06 DIAGNOSIS — M545 Low back pain: Secondary | ICD-10-CM

## 2017-03-06 DIAGNOSIS — R059 Cough, unspecified: Secondary | ICD-10-CM

## 2017-03-06 DIAGNOSIS — M6281 Muscle weakness (generalized): Secondary | ICD-10-CM

## 2017-03-06 LAB — URINALYSIS, COMPLETE
BILIRUBIN UA: NEGATIVE
GLUCOSE, UA: NEGATIVE
KETONES UA: NEGATIVE
NITRITE UA: POSITIVE — AB
Protein, UA: NEGATIVE
RBC UA: NEGATIVE
SPEC GRAV UA: 1.01 (ref 1.005–1.030)
Urobilinogen, Ur: 0.2 mg/dL (ref 0.2–1.0)
pH, UA: 5.5 (ref 5.0–7.5)

## 2017-03-06 LAB — MICROSCOPIC EXAMINATION: Renal Epithel, UA: NONE SEEN /hpf

## 2017-03-06 MED ORDER — ALBUTEROL SULFATE HFA 108 (90 BASE) MCG/ACT IN AERS: 2.0000 | INHALATION_SPRAY | Freq: Four times a day (QID) | RESPIRATORY_TRACT | 0 refills | Status: DC | PRN

## 2017-03-06 MED ORDER — BENZONATATE 100 MG PO CAPS: 100.0000 mg | ORAL_CAPSULE | Freq: Three times a day (TID) | ORAL | 0 refills | Status: AC | PRN

## 2017-03-06 MED ORDER — ALBUTEROL SULFATE (2.5 MG/3ML) 0.083% IN NEBU
2.5000 mg | INHALATION_SOLUTION | Freq: Once | RESPIRATORY_TRACT | Status: AC
  Administered 2017-03-06: 2.5 mg via RESPIRATORY_TRACT

## 2017-03-06 MED ORDER — SULFAMETHOXAZOLE-TRIMETHOPRIM 800-160 MG PO TABS: 1.0000 | ORAL_TABLET | Freq: Two times a day (BID) | ORAL | 0 refills | Status: AC

## 2017-03-06 NOTE — Patient Instructions (Addendum)
It appears that you have a viral upper respiratory infection (cold).  Cold symptoms can last up to 2 weeks.  I recommend that you only use cold medications that are safe in high blood pressure like Coricidin (generic is fine).  Other cold medications can increase your blood pressure.  You may continue plain Mucinex for chest congestion.  I have sent you in Tessalon Perles to use up to 3 times daily if needed for cough.  I have also sent you an albuterol inhaler.  You can use 2 puffs every 6 hours if needed for wheezing or shortness of breath.  Ideally, you should not need this medication more than a couple of days.  Please follow-up with Dr. Darlyn ReadStacks if your symptoms worsen or do not improve.    - Get plenty of rest and drink plenty of fluids. - Try to breathe moist air. Use a cold mist humidifier. - Consume warm fluids (soup or tea) to provide relief for a stuffy nose and to loosen phlegm. - For nasal stuffiness, try saline nasal spray or a Neti Pot.  Afrin nasal spray can also be used but this product should not be used longer than 3 days or it will cause rebound nasal stuffiness (worsening nasal congestion). - For sore throat pain relief: suck on throat lozenges, hard candy or popsicles; gargle with warm salt water (1/4 tsp. salt per 8 oz. of water); and eat soft, bland foods. - Eat a well-balanced diet. If you cannot, ensure you are getting enough nutrients by taking a daily multivitamin. - Avoid dairy products, as they can thicken phlegm. - Avoid alcohol, as it impairs your body's immune system.  CONTACT YOUR DOCTOR IF YOU EXPERIENCE ANY OF THE FOLLOWING: - High fever - Ear pain - Sinus-type headache - Unusually severe cold symptoms - Cough that gets worse while other cold symptoms improve - Flare up of any chronic lung problem, such as asthma - Your symptoms persist longer than 2 weeks

## 2017-03-06 NOTE — Progress Notes (Signed)
Pt aware.

## 2017-03-06 NOTE — Therapy (Signed)
[facility] [place], [place], [address] Phone: [phone]   Fax:  [phone]  Physical Therapy Treatment  Patient Details  Name: Amy Merritt MRN: [id number] Date of Birth: 06-Jul-1960 Referring Provider: Dr. Rennis Harding   Encounter Date: 03/06/2017  PT End of Session - 03/06/17 1033    Visit Number  5    Number of Visits  12    Date for PT Re-Evaluation  03/28/17    PT Start Time  1033    PT Stop Time  1136    PT Time Calculation (min)  63 min    Activity Tolerance  Patient tolerated treatment well    Behavior During Therapy  [facility] for tasks assessed/performed       Past Medical History:  Diagnosis Date  . Arthritis   . Chronic kidney disease   . Depression   . DJD (degenerative joint disease)   . DJD (degenerative joint disease) of cervical spine   . DJD (degenerative joint disease), lumbar   . Headache(784.0)   . Hypertension   . IBS (irritable bowel syndrome)   . Kidney stone   . Lymphocytic colitis   . Morgellons disease   . PONV (postoperative nausea and vomiting)     Past Surgical History:  Procedure Laterality Date  . CARDIAC CATHETERIZATION N/A 06/15/2015   Procedure: Left Heart Cath and Coronary Angiography;  Surgeon: Burnell Blanks, MD;  Location: [place] [facility];  Service: Cardiovascular;  Laterality: N/A;  . CERVICAL FUSION     x 2  . COLONOSCOPY  02/11/2003  . DG MYLEOGRAM LUMBAR SPINE ([place] [facility])    . lithotrpsy    . LUMBAR DISC SURGERY     x 4- lower back with rods and screws  . TUBAL LIGATION      There were no vitals filed for this visit.  Subjective Assessment - 03/06/17 1034    Subjective  Patient reports two falls since her last visit. She fell working with her horse and scraped up her R side, hurt her shoulder and left ankle. The other fall was getting out of bed and scraped her back along the bed.    Currently in Pain?  Yes    Pain Score  6     Pain Location  Back     Pain Orientation  Right;Left    Pain Descriptors / Indicators  Aching    Pain Type  Chronic pain    Pain Score  7    Pain Location  Neck    Pain Orientation  Right;Left    Pain Descriptors / Indicators  Aching    Pain Type  Chronic pain         [facility] PT Assessment - 03/06/17 0001      Standardized Balance Assessment   Standardized Balance Assessment  Five Times Sit to Stand;Berg Balance Test    Five times sit to stand comments   27 seconds pushes off with Bil thurmbs on seat      Berg Balance Test   Sit to Stand  Able to stand without using hands and stabilize independently    Standing Unsupported  Able to stand safely 2 minutes    Sitting with Back Unsupported but Feet Supported on Floor or Stool  Able to sit safely and securely 2 minutes    Stand to Sit  Sits safely with minimal use of hands    Transfers  Able to transfer safely, minor use of hands  Standing Unsupported with Eyes Closed  Able to stand 10 seconds safely    Standing Ubsupported with Feet Together  Able to place feet together independently and stand 1 minute safely    From Standing, Reach Forward with Outstretched Arm  Can reach confidently >25 cm (10")    From Standing Position, Pick up Object from [place] to pick up shoe safely and easily    From Standing Position, Turn to Look Behind Over each Shoulder  Looks behind from both sides and weight shifts well    Turn 360 Degrees  Able to turn 360 degrees safely but slowly    Standing Unsupported, Alternately Place Feet on Step/Stool  Able to stand independently and complete 8 steps >20 seconds    Standing Unsupported, One Foot in Front  Able to plae foot ahead of the other independently and hold 30 seconds    Standing on One Leg  Able to lift leg independently and hold 5-10 seconds    Total Score  51                  [facility] Adult PT Treatment/Exercise - 03/06/17 0001      Exercises   Exercises  Knee/Hip      Knee/Hip Exercises: Standing   Hip  Flexion  Stengthening;1 set;5 reps    Other Standing Knee Exercises  toe taps to 6 inch step x 10 each no UE assist      Knee/Hip Exercises: Seated   Sit to Sand  10 reps      Modalities   Modalities  Electrical Stimulation;Moist Heat      Moist Heat Therapy   Number Minutes Moist Heat  15 Minutes    Moist Heat Location  Cervical;Other (comment) thoracic      Electrical Stimulation   Electrical Stimulation Location  Bil UT and Tspine 80-150 Hz x 15 min to tolerance    Electrical Stimulation Goals  Pain      Manual Therapy   Manual Therapy  Soft tissue mobilization    Soft tissue mobilization  to Bil UT and Cspine       Trigger Point Dry Needling - 03/06/17 1131    Consent Given?  Yes    Muscles Treated Upper Body  Upper trapezius and ES C3 and C4 Bil    Upper Trapezius Response  Twitch reponse elicited;Palpable increased muscle length                PT Long Term Goals - 03/06/17 1128      PT LONG TERM GOAL #1   Title  Pt will be independent with HEP    Time  6    Period  Weeks    Status  On-going      PT LONG TERM GOAL #2   Title  Patient to report decreased pain by 50% in neck and back with ADLS.    Time  6    Period  Weeks    Status  On-going      PT LONG TERM GOAL #3   Title  Patient able to sleep for 5 hours without waking from pain.    Period  Weeks    Status  Achieved      PT LONG TERM GOAL #4   Title  Pt will improve lumbar mobility by 25% in all directions to decrease pain and improve mobility    Baseline  Bil rotation WFL Bil, Able to flex to pick up item from  floor while lunging    Period  Weeks    Status  Partially Met            Plan - 03/06/17 1124    Clinical Impression Statement  Patient presents today reporting two falls since last visit. She has a large abrasion on her R lower leg which is scabbed over and a large bruise on her R flank. Patient did well with balance assessment today scoring 51/56 on the BERG. She demonstrates  left hip weakness with SLS and higher level balance activities. Good response to DN with minimal TPs noted in Bil UT. She has met her sleep goal and made significant progress with lumbar mobility.    Rehab Potential  Good    PT Frequency  2x / week    PT Duration  6 weeks    PT Treatment/Interventions  ADLs/Self Care Home Management;Electrical Stimulation;Moist Heat;Ultrasound;Therapeutic exercise;Balance training;Neuromuscular re-education;Patient/family education;Dry needling;Taping    PT Next Visit Plan  Strengthening for upper back, hip ABDuctors and gluteals. Step ups and functional balance activities. DN prn.    PT Home Exercise Plan  sit to stand    Consulted and Agree with Plan of Care  Patient       Patient will benefit from skilled therapeutic intervention in order to improve the following deficits and impairments:  Decreased activity tolerance, Decreased strength, Pain, Decreased balance, Decreased range of motion, Postural dysfunction  Visit Diagnosis: Cervicalgia  Muscle weakness (generalized)  Pain in thoracic spine  Chronic bilateral low back pain, with sciatica presence unspecified     Problem List Patient Active Problem List   Diagnosis Date Noted  . Abnormal endocrine laboratory test finding 07/20/2016  . Coronary artery disease involving native coronary artery of native heart with angina pectoris ([place])   . Frequency of urination 08/20/2012  . Hypokalemia 08/04/2012  . Anemia 08/04/2012  . HTN (hypertension) 05/21/2012  . Back pain, chronic 05/21/2012  . ADD (attention deficit disorder) 05/21/2012  . HLD (hyperlipidemia) 05/21/2012  . Depression 05/21/2012  . Insomnia 05/21/2012    Grant Fontana PT 03/06/2017, 11:45 AM  [facility] [address] [place], [place], [address] Phone: [phone]   Fax:  [phone]  Name: Amy Merritt MRN: [id number] Date of Birth: Feb 01, 1961

## 2017-03-06 NOTE — Progress Notes (Signed)
Subjective: CC: URI PCP: Mechele ClaudeStacks, Warren, MD WUJ:[id number]:Amy Merritt is a 57 y.o. female presenting to clinic today for:  1. Cold symptoms  Patient reports cough, chest congestion that started 1 week ago.  Denies hemoptysis, nasal congestion, rhinorrhea, sinus pressure, headache, SOB, dizziness, rash, nausea, vomiting, diarrhea, fevers, chills, myalgia, sick contacts, recent travel.  Patient has used Mucinex, NyQuil and Claritin for 1 day with little relief of symptoms.  Denies history of COPD or asthma.  No current tobacco use/ exposure.  2. Dysuria Patient reports a several week history of intermittent dysuria and foul odor to her urine.  She notes that she was treated for urinary tract infection in October.  She completed the antibiotic but wonders if she still has a urinary tract infection.  Denies hematuria, fevers, abdominal pain, nausea, vomiting, new onset of back pain.  ROS: Per HPI  Allergies  Allergen Reactions  . Amoxicillin-Pot Clavulanate Hives and Rash  . Celecoxib Hives and Rash  . Morphine And Related Itching and Swelling    Liquid only   Past Medical History:  Diagnosis Date  . Arthritis   . Chronic kidney disease   . Depression   . DJD (degenerative joint disease)   . DJD (degenerative joint disease) of cervical spine   . DJD (degenerative joint disease), lumbar   . Headache(784.0)   . Hypertension   . IBS (irritable bowel syndrome)   . Kidney stone   . Lymphocytic colitis   . Morgellons disease   . PONV (postoperative nausea and vomiting)     Current Outpatient Medications:  .  ADDERALL XR 20 MG 24 hr capsule, Take 20 mg by mouth daily. , Disp: , Rfl:  .  ALPRAZolam (XANAX) 1 MG tablet, Take 1 tablet (1 mg total) by mouth 4 (four) times daily as needed. ANXIETY, Disp: 60 tablet, Rfl: 5 .  aspirin 81 MG EC tablet, TAKE 1 BY MOUTH DAILY, Disp: 90 tablet, Rfl: 0 .  atorvastatin (LIPITOR) 40 MG tablet, Take 1 tablet (40 mg total) by mouth daily., Disp: 90  tablet, Rfl: 3 .  [employer] OMEGA-3 KRILL OIL 300 MG CAPS, Take 300 mg by mouth daily. , Disp: , Rfl:  .  FLUoxetine (PROZAC) 10 MG capsule, TAKE 60MG  BY MOUTH DAILY, Disp: , Rfl:  .  gabapentin (NEURONTIN) 400 MG capsule, Take 400 mg by mouth 3 (three) times daily. , Disp: , Rfl:  .  Glucosamine-Chondroitin (GLUCOSAMINE CHONDR COMPLEX PO), Take 1 tablet by mouth 2 (two) times daily. , Disp: , Rfl:  .  naproxen (NAPROSYN) 500 MG tablet, TK 1 T PO  BID, Disp: , Rfl: 3 .  nitroGLYCERIN (NITROSTAT) 0.4 MG SL tablet, Place 1 tablet (0.4 mg total) under the tongue every 5 (five) minutes as needed for chest pain., Disp: 25 tablet, Rfl: 3 .  oxycodone (ROXICODONE) 30 MG immediate release tablet, Take 30 mg by mouth every 4 (four) hours as needed for pain (Dr Thyra BreedMark Phillips). , Disp: , Rfl:  .  oxyCODONE-acetaminophen (PERCOCET) 10-325 MG tablet, Take 1 tablet by mouth every 6 (six) hours as needed for pain., Disp: , Rfl:  .  pantoprazole (PROTONIX) 40 MG tablet, Take 40 mg by mouth daily. , Disp: , Rfl: 1 .  benzonatate (TESSALON PERLES) 100 MG capsule, Take 1 capsule (100 mg total) by mouth 3 (three) times daily as needed for cough., Disp: 20 capsule, Rfl: 0 .  tiZANidine (ZANAFLEX) 4 MG capsule, Take 4 mg by mouth every 6 (  six) hours., Disp: , Rfl:  .  trifluoperazine (STELAZINE) 1 MG tablet, Take 1 mg by mouth as directed., Disp: , Rfl:  Social History   Socioeconomic History  . Marital status: Married    Spouse name: Not on file  . Number of children: 2  . Years of education: Not on file  . Highest education level: Not on file  Social Needs  . Financial resource strain: Not on file  . Food insecurity - worry: Not on file  . Food insecurity - inability: Not on file  . Transportation needs - medical: Not on file  . Transportation needs - non-medical: Not on file  Occupational History  . Not on file  Tobacco Use  . Smoking status: Former Smoker    Last attempt to quit: 08/27/2010    Years since  quitting: 6.5  . Smokeless tobacco: Never Used  Substance and Sexual Activity  . Alcohol use: No  . Drug use: No  . Sexual activity: Yes    Birth control/protection: Surgical  Other Topics Concern  . Not on file  Social History Narrative  . Not on file   Family History  Problem Relation Age of Onset  . Colitis Father   . Hypertension Father   . Diabetes Father   . Cancer Father        ?  Marland Kitchen Heart attack Father 84       CABG  . Hypertension Mother   . Heart failure Mother   . Heart attack Mother        Died in her later 52s  . Hypertension Brother   . Heart failure Maternal Grandmother   . Cancer Maternal Grandfather   . Heart disease Paternal Grandmother   . COPD Paternal Grandfather   . Diabetes Unknown        multiple aunts  . Colon cancer Neg Hx   . Rectal cancer Neg Hx   . Stomach cancer Neg Hx   . Esophageal cancer Neg Hx     Objective: Office vital signs reviewed. BP 134/76   Pulse 77   Temp (!) 97 F (36.1 C) (Oral)   Ht 5\' 1"  (1.549 m)   Wt 165 lb (74.8 kg)   SpO2 99%   BMI 31.18 kg/m   Physical Examination:  General: Awake, alert, appears drowsy, No acute distress HEENT: Normal    Neck: No masses palpated. No lymphadenopathy    Ears: Tympanic membranes intact, normal light reflex, no erythema, no bulging    Eyes: PERRLA, extraocular membranes intact, sclera white    Nose: nasal turbinates moist, no nasal discharge    Throat: moist mucus membranes, no erythema, no tonsillar exudate.  Airway is patent Cardio: regular rate and rhythm, S1S2 heard, no murmurs appreciated Pulm: Global mild expiratory wheezes.  Good air movement.  No rhonchi or rales; normal work of breathing on room air GU: no suprapubic TTP Neuro: Intermittently dozing off.  When questioned she notes "I took a Xanax and a Percocet before went to physical therapy"  Assessment/ Plan: 57 y.o. female   1. Cough Global expiratory wheezes appreciated on today's exam.  No known  underlying pulmonary disorder.  Normal respiratory rate and pulse ox on room air.  She is afebrile.  She is given an albuterol nebulizer treatment here in office.  Repeat pulmonary exam was improved with complete resolution of wheezes.  She was prescribed albuterol 2 puffs every 6 hours as needed wheeze or shortness of breath.  Tessalon  Perles p.o. twice daily as needed cough.  Continue supportive care. Strict return precautions and reasons for emergent evaluation in the emergency department review with patient.  They voiced understanding and will follow-up as needed.  2. Bronchospasm, acute  3. Dysuria Will contact patient with results of urinalysis. - Urinalysis, Complete   Orders Placed This Encounter  Procedures  . Urinalysis, Complete   Meds ordered this encounter  Medications  . benzonatate (TESSALON PERLES) 100 MG capsule    Sig: Take 1 capsule (100 mg total) by mouth 3 (three) times daily as needed for cough.    Dispense:  20 capsule    Refill:  0  . albuterol (PROVENTIL) (2.5 MG/3ML) 0.083% nebulizer solution 2.5 mg  . albuterol (PROVENTIL HFA;VENTOLIN HFA) 108 (90 Base) MCG/ACT inhaler    Sig: Inhale 2 puffs into the lungs every 6 (six) hours as needed for wheezing or shortness of breath.    Dispense:  1 Inhaler    Refill:  0     Cedarius Kersh Hulen Skains, DO [facility] [phone]

## 2017-03-06 NOTE — Progress Notes (Signed)
Patient tolerated Septra DS in September 2014.  Last urine culture reviewed which grew pansensitive E. coli.  Today's urinalysis with positive nitrites, 1+ leukocytes and trace intact blood.  Urine microscopy with 11-30 white blood cells, 0-2 red blood cells and many bacteria.  Urine sent for culture.  Meds ordered this encounter  Medications  . sulfamethoxazole-trimethoprim (BACTRIM DS) 800-160 MG tablet    Sig: Take 1 tablet by mouth 2 (two) times daily for 3 days.    Dispense:  6 tablet    Refill:  0   Akari Defelice M. Nadine CountsGottschalk, DO [facility]

## 2017-03-06 NOTE — Addendum Note (Signed)
Addended by: Raliegh IpGOTTSCHALK, Imraan Wendell M on: 03/06/2017 03:05 PM   Modules accepted: Orders

## 2017-03-08 LAB — URINE CULTURE

## 2017-03-10 ENCOUNTER — Encounter: Payer: 59 | Admitting: Physical Therapy

## 2017-03-13 ENCOUNTER — Other Ambulatory Visit: Payer: Self-pay | Admitting: Family Medicine

## 2017-03-14 ENCOUNTER — Ambulatory Visit: Payer: 59 | Attending: Orthopaedic Surgery | Admitting: Physical Therapy

## 2017-03-14 DIAGNOSIS — M545 Low back pain: Secondary | ICD-10-CM | POA: Diagnosis present

## 2017-03-14 DIAGNOSIS — M546 Pain in thoracic spine: Secondary | ICD-10-CM | POA: Diagnosis present

## 2017-03-14 DIAGNOSIS — M542 Cervicalgia: Secondary | ICD-10-CM | POA: Diagnosis not present

## 2017-03-14 DIAGNOSIS — M6281 Muscle weakness (generalized): Secondary | ICD-10-CM | POA: Diagnosis present

## 2017-03-14 DIAGNOSIS — G8929 Other chronic pain: Secondary | ICD-10-CM | POA: Diagnosis present

## 2017-03-14 NOTE — Therapy (Signed)
[facility] [place], [place], [address] Phone: [phone]   Fax:  [phone]  Physical Therapy Treatment  Patient Details  Name: Amy Merritt MRN: [id number] Date of Birth: 30-Dec-1960 Referring Provider: Dr. Rennis Harding   Encounter Date: 03/14/2017  PT End of Session - 03/14/17 1124    Visit Number  6    Number of Visits  12    Date for PT Re-Evaluation  03/28/17    PT Start Time  4650    PT Stop Time  1133    PT Time Calculation (min)  58 min    Activity Tolerance  Patient tolerated treatment well    Behavior During Therapy  [facility] for tasks assessed/performed       Past Medical History:  Diagnosis Date  . Arthritis   . Chronic kidney disease   . Depression   . DJD (degenerative joint disease)   . DJD (degenerative joint disease) of cervical spine   . DJD (degenerative joint disease), lumbar   . Headache(784.0)   . Hypertension   . IBS (irritable bowel syndrome)   . Kidney stone   . Lymphocytic colitis   . Morgellons disease   . PONV (postoperative nausea and vomiting)     Past Surgical History:  Procedure Laterality Date  . CARDIAC CATHETERIZATION N/A 06/15/2015   Procedure: Left Heart Cath and Coronary Angiography;  Surgeon: Burnell Blanks, MD;  Location: [place] [facility];  Service: Cardiovascular;  Laterality: N/A;  . CERVICAL FUSION     x 2  . COLONOSCOPY  02/11/2003  . DG MYLEOGRAM LUMBAR SPINE ([place] [facility])    . lithotrpsy    . LUMBAR DISC SURGERY     x 4- lower back with rods and screws  . TUBAL LIGATION      There were no vitals filed for this visit.  Subjective Assessment - 03/14/17 1035    Subjective  Patient states she has not fallen since her last visit but she has been in bed sick with bronchitis. Her neck is really sore and stiff since her falls.    Pertinent History  cervical fusions x 2, lumbar surgeries x 4 (fusions then screws/rods into Tspine), chronic kidney disease, coronary  atherosclerosis, Morgollans disease, arachnoid web T spine    Currently in Pain?  Yes    Pain Score  5     Pain Location  Back    Pain Orientation  Right;Left;Upper    Pain Type  Chronic pain    Pain Onset  More than a month ago    Pain Frequency  Constant    Aggravating Factors   unsure    Pain Relieving Factors  meds, breathing techniques, heat, TENs sometimes    Effect of Pain on Daily Activities  limited with ADLS    Pain Score  7    Pain Location  Neck    Pain Orientation  Right;Left    Pain Descriptors / Indicators  Aching    Pain Type  Chronic pain    Pain Onset  More than a month ago    Pain Frequency  Constant    Aggravating Factors   turning    Pain Relieving Factors  meds, heat, DN                      [facility] Adult PT Treatment/Exercise - 03/14/17 0001      Knee/Hip Exercises: Stretches   Other Knee/Hip Stretches  levator  scap stretch x 20 sec left; Bil UT stretch x 15 sec UT stretch painful      Knee/Hip Exercises: Standing   Other Standing Knee Exercises  Pink xts: rows, ext 2x20 each; 90/90 scap retraction x 10      Modalities   Modalities  Electrical Stimulation;Moist Heat      Moist Heat Therapy   Number Minutes Moist Heat  15 Minutes    Moist Heat Location  Cervical;Other (comment) thoracic      Electrical Stimulation   Electrical Stimulation Location  Bil UT and Tspine 80-150 Hz x 15 min to tolerance    Electrical Stimulation Goals  Pain      Manual Therapy   Manual Therapy  Soft tissue mobilization    Soft tissue mobilization  to Bil UT and Cspine and upper Tspine       Trigger Point Dry Needling - 03/14/17 1230    Consent Given?  Yes    Muscles Treated Upper Body  Upper trapezius;Oblique capitus;Suboccipitals muscle group;Levator scapulae Bil and mulitifidi C3-4    Upper Trapezius Response  Twitch reponse elicited;Palpable increased muscle length    Oblique Capitus Response  Twitch response elicited;Palpable increased muscle length     SubOccipitals Response  Twitch response elicited;Palpable increased muscle length L>R    Levator Scapulae Response  Twitch response elicited;Palpable increased muscle length L>R                PT Long Term Goals - 03/14/17 1232      PT LONG TERM GOAL #1   Title  Pt will be independent with HEP    Time  6    Period  Weeks    Status  On-going      PT LONG TERM GOAL #2   Title  Patient to report decreased pain by 50% in neck and back with ADLS.    Baseline  Patient reports improvement until fall last week    Time  6    Period  Weeks    Status  On-going      PT LONG TERM GOAL #3   Title  Patient able to sleep for 5 hours without waking from pain.    Time  6    Period  Weeks    Status  Achieved      PT LONG TERM GOAL #4   Title  Pt will improve lumbar mobility by 25% in all directions to decrease pain and improve mobility    Baseline  Bil rotation WFL Bil, Able to flex to pick up item from floor while lunging, SB still limited    Time  6    Period  Weeks    Status  Partially Met            Plan - 03/14/17 1233    Clinical Impression Statement  Patient reports improvements overall until recent falls. She reported increased pain and stiffness in her neck today and responded well to DN with ++ twitch responses in suboccipitals, UT and multifidi. Cervical ROM WFL after dry needling but painful with R lateral flexion.     Rehab Potential  Good    Clinical Impairments Affecting Rehab Potential  see above    PT Frequency  2x / week    PT Duration  6 weeks    PT Treatment/Interventions  ADLs/Self Care Home Management;Electrical Stimulation;Moist Heat;Ultrasound;Therapeutic exercise;Balance training;Neuromuscular re-education;Patient/family education;Dry needling;Taping    PT Next Visit Plan   Step ups and functional balance activities;  Strengthening for upper back, hip ABDuctors and gluteals.. DN prn.    PT Home Exercise Plan  sit to stand    Consulted and Agree with  Plan of Care  Patient       Patient will benefit from skilled therapeutic intervention in order to improve the following deficits and impairments:  Decreased activity tolerance, Decreased strength, Pain, Decreased balance, Decreased range of motion, Postural dysfunction  Visit Diagnosis: Cervicalgia  Muscle weakness (generalized)     Problem List Patient Active Problem List   Diagnosis Date Noted  . Abnormal endocrine laboratory test finding 07/20/2016  . Coronary artery disease involving native coronary artery of native heart with angina pectoris ([place])   . Frequency of urination 08/20/2012  . Hypokalemia 08/04/2012  . Anemia 08/04/2012  . HTN (hypertension) 05/21/2012  . Back pain, chronic 05/21/2012  . ADD (attention deficit disorder) 05/21/2012  . HLD (hyperlipidemia) 05/21/2012  . Depression 05/21/2012  . Insomnia 05/21/2012    Colbi Schiltz PT 03/14/2017, 12:43 PM  [facility] [place], [place], [address] Phone: [phone]   Fax:  [phone]  Name: Amy Merritt MRN: [id number] Date of Birth: 1960/06/11

## 2017-03-17 ENCOUNTER — Ambulatory Visit: Payer: 59 | Admitting: Physical Therapy

## 2017-03-17 DIAGNOSIS — M546 Pain in thoracic spine: Secondary | ICD-10-CM

## 2017-03-17 DIAGNOSIS — M542 Cervicalgia: Secondary | ICD-10-CM | POA: Diagnosis not present

## 2017-03-17 DIAGNOSIS — M6281 Muscle weakness (generalized): Secondary | ICD-10-CM

## 2017-03-17 NOTE — Therapy (Signed)
[facility] [place], [place], [address] Phone: [phone]   Fax:  [phone]  Physical Therapy Treatment  Patient Details  Name: Amy Merritt MRN: [id number] Date of Birth: 1960-05-26 Referring Provider: Dr. Rennis Harding   Encounter Date: 03/17/2017  PT End of Session - 03/17/17 0945    Visit Number  7    Number of Visits  12    Date for PT Re-Evaluation  03/28/17    PT Start Time  0902    PT Stop Time  0945    PT Time Calculation (min)  43 min    Activity Tolerance  Patient tolerated treatment well    Behavior During Therapy  [facility] for tasks assessed/performed       Past Medical History:  Diagnosis Date  . Arthritis   . Chronic kidney disease   . Depression   . DJD (degenerative joint disease)   . DJD (degenerative joint disease) of cervical spine   . DJD (degenerative joint disease), lumbar   . Headache(784.0)   . Hypertension   . IBS (irritable bowel syndrome)   . Kidney stone   . Lymphocytic colitis   . Morgellons disease   . PONV (postoperative nausea and vomiting)     Past Surgical History:  Procedure Laterality Date  . CARDIAC CATHETERIZATION N/A 06/15/2015   Procedure: Left Heart Cath and Coronary Angiography;  Surgeon: Burnell Blanks, MD;  Location: [place] [facility];  Service: Cardiovascular;  Laterality: N/A;  . CERVICAL FUSION     x 2  . COLONOSCOPY  02/11/2003  . DG MYLEOGRAM LUMBAR SPINE ([place] HX)    . lithotrpsy    . LUMBAR DISC SURGERY     x 4- lower back with rods and screws  . TUBAL LIGATION      There were no vitals filed for this visit.  Subjective Assessment - 03/17/17 0905    Subjective  Patient states her neck is still really sore. She thinks the falls really hurt her and the bronchitis coughing for over a week. She states she hurts the most from 3 pm on.    Pertinent History  cervical fusions x 2, lumbar surgeries x 4 (fusions then screws/rods into Tspine), chronic kidney  disease, coronary atherosclerosis, Morgollans disease, arachnoid web T spine    Patient Stated Goals  to reduce pain     Currently in Pain?  Yes    Pain Score  5     Pain Location  Back    Pain Orientation  Right;Left;Upper    Pain Descriptors / Indicators  Aching    Pain Type  Chronic pain    Pain Score  6    Pain Location  Neck    Pain Orientation  Right;Left    Pain Descriptors / Indicators  Aching    Pain Type  Chronic pain                      [facility] Adult PT Treatment/Exercise - 03/17/17 0001      Knee/Hip Exercises: Standing   Hip Flexion  Stengthening;Both;2 sets;10 reps;Knee bent    Hip Abduction  Stengthening;Both;2 sets;10 reps VCs to slow down for correct form    Forward Step Up  Left;2 sets;10 reps;Hand Hold: 2;Step Height: 8" VCs to squeeze glutes at top          Balance Exercises - 03/17/17 0918      Balance Exercises: Standing   Standing Eyes  Opened  Wide (BOA);Head turns;Foam/compliant surface;5 reps then with cervcial retraction x 10    Standing Eyes Closed  Wide (BOA);Foam/compliant surface;30 secs    Tandem Stance  Eyes open;1 rep;30 secs;Eyes closed;Foam/compliant surface each and both sides    SLS  Eyes open;Solid surface;3 reps;Time max hold; lumbar flexion reach on R; no reach left     Rockerboard  Anterior/posterior;Head turns;10 reps;Intermittent UE support    Other Standing Exercises  ball [id number]        PT Education - 03/17/17 1214    Education provided  Yes    Education Details  HEP    Person(s) Educated  Patient    Methods  Explanation;Demonstration;Verbal cues;Handout    Comprehension  Verbalized understanding;Returned demonstration          PT Long Term Goals - 03/14/17 1232      PT LONG TERM GOAL #1   Title  Pt will be independent with HEP    Time  6    Period  Weeks    Status  On-going      PT LONG TERM GOAL #2   Title  Patient to report decreased pain by 50% in neck and back with ADLS.    Baseline  Patient  reports improvement until fall last week    Time  6    Period  Weeks    Status  On-going      PT LONG TERM GOAL #3   Title  Patient able to sleep for 5 hours without waking from pain.    Time  6    Period  Weeks    Status  Achieved      PT LONG TERM GOAL #4   Title  Pt will improve lumbar mobility by 25% in all directions to decrease pain and improve mobility    Baseline  Bil rotation WFL Bil, Able to flex to pick up item from floor while lunging, SB still limited    Time  6    Period  Weeks    Status  Partially Met            Plan - 03/17/17 1214    Clinical Impression Statement  Patent did very well with balance and strengthening exercises today with no complaints of increased pain.     PT Treatment/Interventions  ADLs/Self Care Home Management;Electrical Stimulation;Moist Heat;Ultrasound;Therapeutic exercise;Balance training;Neuromuscular re-education;Patient/family education;Dry needling;Taping    PT Next Visit Plan   Step ups and functional balance activities; Strengthening for upper back, hip ABDuctors and gluteals.. DN prn.    PT Home Exercise Plan  sit to stand, SLS, cervical and scapular retraction       Patient will benefit from skilled therapeutic intervention in order to improve the following deficits and impairments:  Decreased activity tolerance, Decreased strength, Pain, Decreased balance, Decreased range of motion, Postural dysfunction  Visit Diagnosis: Cervicalgia  Muscle weakness (generalized)  Pain in thoracic spine     Problem List Patient Active Problem List   Diagnosis Date Noted  . Abnormal endocrine laboratory test finding 07/20/2016  . Coronary artery disease involving native coronary artery of native heart with angina pectoris ([place])   . Frequency of urination 08/20/2012  . Hypokalemia 08/04/2012  . Anemia 08/04/2012  . HTN (hypertension) 05/21/2012  . Back pain, chronic 05/21/2012  . ADD (attention deficit disorder) 05/21/2012  . HLD  (hyperlipidemia) 05/21/2012  . Depression 05/21/2012  . Insomnia 05/21/2012    Madelyn Flavors PT 03/17/2017, 12:19 PM  [facility] [place], [place], [address] Phone: [phone]   Fax:  [phone]  Name: KAYLEIGH BROADWELL MRN: [id number] Date of Birth: [birth year]

## 2017-03-17 NOTE — Patient Instructions (Signed)
  Scapular Retraction (Standing)   With arms at sides, pinch shoulder blades together. Repeat 10 times per set. Do 1-3 sets per session. Do 2 sessions per day.   Flexibility: Neck Retraction   Pull head straight back, keeping eyes and jaw level. Hold 3-5 seconds. Repeat _10 times per set. Do 3-5  sessions per day.  http://orth.exer.us/344   Posture - Sitting   Sit upright, head facing forward. Try using a roll to support lower back. Keep shoulders relaxed, and avoid rounded back. Keep hips level with knees. Avoid crossing legs for long periods.  Solon PalmJulie Jomar Denz, PT 03/17/17 9:45 AM; [facility] [address] [place], [place], [address] Phone: [phone]   Fax:  [phone]

## 2017-03-20 ENCOUNTER — Ambulatory Visit: Payer: 59 | Admitting: Physical Therapy

## 2017-03-20 DIAGNOSIS — G8929 Other chronic pain: Secondary | ICD-10-CM

## 2017-03-20 DIAGNOSIS — M542 Cervicalgia: Secondary | ICD-10-CM | POA: Diagnosis not present

## 2017-03-20 DIAGNOSIS — M6281 Muscle weakness (generalized): Secondary | ICD-10-CM

## 2017-03-20 DIAGNOSIS — M546 Pain in thoracic spine: Secondary | ICD-10-CM

## 2017-03-20 DIAGNOSIS — M545 Low back pain: Secondary | ICD-10-CM

## 2017-03-20 NOTE — Patient Instructions (Signed)
  Strengthening: Chest Pull - Resisted   With resistive band looped around each hand, and arms straight out in front, stretch band across chest. Repeat __10__ times per set. Do 1-3____ sets per session. Do _1___ sessions per day.  http://orth.exer.us/926   Copyright  VHI. All rights reserved.   Resistive Band Rowing   With resistive band anchored in door, grasp both ends. Keeping elbows bent, pull back, squeezing shoulder blades together. Hold _3-5___ seconds. Repeat _10-30___ times. Do ____ sessions per day. 1 http://gt2.exer.us/98   Copyright  VHI. All rights reserved.   Strengthening: Resisted Extension   Hold tubing with both hands, arms forward. Pull arms back, elbow straight. Repeat _10-30___ times per set. Do ____ sets per session. Do _1___ sessions per day.  http://orth.exer.us/833   Copyright  VHI. All rights reserved.    ALSO DO SINGLE LEG STANCE WITH REACH, MARCHING WITH BACK AGAINST THE WALL.  DO SETS OF 5. 1-3 SETS. STOP IF ANYTHING HURTS!!  Solon PalmJulie Sareena Odeh, PT 03/20/17 11:19 AM [facility] [address] [place], [place], [address] Phone: [phone]   Fax:  [phone]

## 2017-03-20 NOTE — Therapy (Signed)
[facility] [place], [place], [address] Phone: [phone]   Fax:  [phone]  Physical Therapy Treatment  Patient Details  Name: Amy Merritt MRN: [id number] Date of Birth: [birth year] Referring Provider: Dr. Rennis Harding   Encounter Date: 03/20/2017  PT End of Session - 03/20/17 1116    Visit Number  8    Number of Visits  12    Date for PT Re-Evaluation  03/28/17    PT Start Time  1033    PT Stop Time  1115    PT Time Calculation (min)  42 min    Activity Tolerance  Patient tolerated treatment well    Behavior During Therapy  [facility] for tasks assessed/performed       Past Medical History:  Diagnosis Date  . Arthritis   . Chronic kidney disease   . Depression   . DJD (degenerative joint disease)   . DJD (degenerative joint disease) of cervical spine   . DJD (degenerative joint disease), lumbar   . Headache(784.0)   . Hypertension   . IBS (irritable bowel syndrome)   . Kidney stone   . Lymphocytic colitis   . Morgellons disease   . PONV (postoperative nausea and vomiting)     Past Surgical History:  Procedure Laterality Date  . CARDIAC CATHETERIZATION N/A 06/15/2015   Procedure: Left Heart Cath and Coronary Angiography;  Surgeon: Burnell Blanks, MD;  Location: [place] [facility];  Service: Cardiovascular;  Laterality: N/A;  . CERVICAL FUSION     x 2  . COLONOSCOPY  02/11/2003  . DG MYLEOGRAM LUMBAR SPINE ([place] HX)    . lithotrpsy    . LUMBAR DISC SURGERY     x 4- lower back with rods and screws  . TUBAL LIGATION      There were no vitals filed for this visit.  Subjective Assessment - 03/20/17 1041    Subjective  Patient reports she is doing pretty well today. Some soreness in low back. She was able to tolerate a full day of activity     How long can you sit comfortably?  2 hours    How long can you stand comfortably?  10-15 min    How long can you walk comfortably?  7 min    Patient Stated Goals   to reduce pain     Currently in Pain?  Yes    Pain Score  5     Pain Location  Back    Pain Orientation  Lower    Pain Descriptors / Indicators  Aching    Pain Type  Chronic pain    Pain Onset  More than a month ago    Pain Score  3    Pain Location  Neck    Pain Orientation  Right;Left    Pain Descriptors / Indicators  Aching                      [facility] Adult PT Treatment/Exercise - 03/20/17 0001      Knee/Hip Exercises: Standing   Hip Flexion  Stengthening;Both;2 sets;10 reps;Knee bent    Hip Abduction  Stengthening;Both;10 reps;Knee straight;5 reps;1 set using mirror for visual cues; 1x10 then 1x5    Forward Step Up  Left;2 sets;10 reps;Step Height: 8";Hand Hold: 1 VCs to squeeze glutes at top; mirror for visual cues    SLS  with front reach x 10 bil    Other Standing Knee Exercises  Pink xts: rows, ext 2x20 each    Other Standing Knee Exercises  scapular unattached with yellow band: horizontal ABD and diagonals x 20 each             PT Education - 03/20/17 1314    Education provided  Yes    Education Details  HEP    Person(s) Educated  Patient    Methods  Explanation;Demonstration;Handout    Comprehension  Verbalized understanding;Returned demonstration          PT Long Term Goals - 03/20/17 1319      PT LONG TERM GOAL #1   Title  Pt will be independent with HEP    Time  6    Period  Weeks    Status  On-going      PT LONG TERM GOAL #2   Title  Patient to report decreased pain by 50% in neck and back with ADLS.    Baseline  Neck 3/10 today, back still at previous level    Time  6    Period  Weeks    Status  Partially Met      PT LONG TERM GOAL #3   Title  Patient able to sleep for 5 hours without waking from pain.    Time  6    Period  Weeks    Status  Achieved      PT LONG TERM GOAL #4   Title  Pt will improve lumbar mobility by 25% in all directions to decrease pain and improve mobility    Time  6    Period  Weeks    Status   Partially Met            Plan - 03/20/17 1315    Clinical Impression Statement  Patient presents today with reports of significant improvement in pain and function. She was able to tolerate a full day of traveling to [place] and attending a basketball game with her grandson without increased pain. She still had to take her pain meds every 4 hours, but she was able to make a short walk in the community and tolerate the car ride as well. She did very well with TE today and balance has already increased due to compliance with HEP. LTGs are ongoing.    Rehab Potential  Good    Clinical Impairments Affecting Rehab Potential  see above    PT Frequency  2x / week    PT Duration  6 weeks    PT Treatment/Interventions  ADLs/Self Care Home Management;Electrical Stimulation;Moist Heat;Ultrasound;Therapeutic exercise;Balance training;Neuromuscular re-education;Patient/family education;Dry needling;Taping    PT Next Visit Plan   Step ups and functional balance activities; Strengthening for upper back, hip ABDuctors and gluteals.. DN prn.    PT Home Exercise Plan  sit to stand, SLS, cervical and scapular retraction with tband, SLS    Consulted and Agree with Plan of Care  Patient       Patient will benefit from skilled therapeutic intervention in order to improve the following deficits and impairments:  Decreased activity tolerance, Decreased strength, Pain, Decreased balance, Decreased range of motion, Postural dysfunction  Visit Diagnosis: Muscle weakness (generalized)  Cervicalgia  Pain in thoracic spine  Chronic bilateral low back pain, with sciatica presence unspecified     Problem List Patient Active Problem List   Diagnosis Date Noted  . Abnormal endocrine laboratory test finding 07/20/2016  . Coronary artery disease involving native coronary artery of native heart with angina pectoris ([place])   .  Frequency of urination 08/20/2012  . Hypokalemia 08/04/2012  . Anemia 08/04/2012  .  HTN (hypertension) 05/21/2012  . Back pain, chronic 05/21/2012  . ADD (attention deficit disorder) 05/21/2012  . HLD (hyperlipidemia) 05/21/2012  . Depression 05/21/2012  . Insomnia 05/21/2012    Madelyn Flavors PT 03/20/2017, 1:21 PM  [facility] [address] [place], [place], [address] Phone: [phone]   Fax:  [phone]  Name: REISA COPPOLA MRN: [id number] Date of Birth: March 16, 1960

## 2017-03-24 ENCOUNTER — Ambulatory Visit: Payer: 59 | Admitting: Physical Therapy

## 2017-03-24 DIAGNOSIS — M542 Cervicalgia: Secondary | ICD-10-CM

## 2017-03-24 DIAGNOSIS — G8929 Other chronic pain: Secondary | ICD-10-CM

## 2017-03-24 DIAGNOSIS — M545 Low back pain: Principal | ICD-10-CM

## 2017-03-24 NOTE — Therapy (Signed)
[facility] [place], [place], [address] Phone: [phone]   Fax:  [phone]  Physical Therapy Treatment  Patient Details  Name: Amy Merritt MRN: [id number] Date of Birth: November 14, 1960 Referring Provider: Dr. Rennis Harding   Encounter Date: 03/24/2017  PT End of Session - 03/24/17 0911    Visit Number  9    Number of Visits  12    Date for PT Re-Evaluation  03/28/17    PT Start Time  0910    PT Stop Time  1004    PT Time Calculation (min)  54 min    Activity Tolerance  Patient tolerated treatment well    Behavior During Therapy  [facility] for tasks assessed/performed       Past Medical History:  Diagnosis Date  . Arthritis   . Chronic kidney disease   . Depression   . DJD (degenerative joint disease)   . DJD (degenerative joint disease) of cervical spine   . DJD (degenerative joint disease), lumbar   . Headache(784.0)   . Hypertension   . IBS (irritable bowel syndrome)   . Kidney stone   . Lymphocytic colitis   . Morgellons disease   . PONV (postoperative nausea and vomiting)     Past Surgical History:  Procedure Laterality Date  . CARDIAC CATHETERIZATION N/A 06/15/2015   Procedure: Left Heart Cath and Coronary Angiography;  Surgeon: Burnell Blanks, MD;  Location: [place] [facility];  Service: Cardiovascular;  Laterality: N/A;  . CERVICAL FUSION     x 2  . COLONOSCOPY  02/11/2003  . DG MYLEOGRAM LUMBAR SPINE ([place] HX)    . lithotrpsy    . LUMBAR DISC SURGERY     x 4- lower back with rods and screws  . TUBAL LIGATION      There were no vitals filed for this visit.  Subjective Assessment - 03/24/17 0912    Subjective  Patient presents today with increased pain in her neck and back, possibly due to a poor nights sleep secondary to allergic reaction to shellfish and a lot of running yesterday to MD appts.    Pertinent History  cervical fusions x 2, lumbar surgeries x 4 (fusions then screws/rods into  Tspine), chronic kidney disease, coronary atherosclerosis, Morgollans disease, arachnoid web T spine    Diagnostic tests  mri, xrays    Patient Stated Goals  to reduce pain     Currently in Pain?  Yes    Pain Score  7     Pain Location  Back    Pain Orientation  Lower    Pain Descriptors / Indicators  Aching    Pain Type  Chronic pain    Pain Onset  More than a month ago    Pain Frequency  Constant    Aggravating Factors   everything    Pain Relieving Factors  meds, breathing techniques, heat, TENS    Effect of Pain on Daily Activities  limited with ADLS    Pain Score  5    Pain Location  Neck    Pain Orientation  Right;Left    Pain Descriptors / Indicators  Aching    Pain Type  Chronic pain    Pain Onset  More than a month ago    Pain Frequency  Constant    Aggravating Factors   turning    Pain Relieving Factors  meds, heat, DN  [place] Adult PT Treatment/Exercise - 03/24/17 0001      Modalities   Modalities  Electrical Stimulation;Moist Heat      Moist Heat Therapy   Number Minutes Moist Heat  15 Minutes    Moist Heat Location  Cervical;Lumbar Spine      Electrical Stimulation   Electrical Stimulation Location  Bil UT and lumbar    Electrical Stimulation Action  premod    Electrical Stimulation Parameters  80-150 Hz x 15 min    Electrical Stimulation Goals  Pain;Tone      Manual Therapy   Manual Therapy  Soft tissue mobilization;Myofascial release    Soft tissue mobilization  to cervical and lumbar paraspinals, suboccipitals and UT    Myofascial Release  IASTM to cervical and lumbar paraspinals, suboccipitals and UT       Trigger Point Dry Needling - 03/24/17 1305    Consent Given?  Yes    Muscles Treated Upper Body  Upper trapezius;Suboccipitals muscle group;Longissimus    Upper Trapezius Response  Twitch reponse elicited;Palpable increased muscle length L only    SubOccipitals Response  Twitch response elicited;Palpable increased  muscle length Bil    Levator Scapulae Response  Twitch response elicited;Palpable increased muscle length L    Longissimus Response  Twitch response elicited;Palpable increased muscle length C2-5Bil, L2-S1 Bil                PT Long Term Goals - 03/20/17 1319      PT LONG TERM GOAL #1   Title  Pt will be independent with HEP    Time  6    Period  Weeks    Status  On-going      PT LONG TERM GOAL #2   Title  Patient to report decreased pain by 50% in neck and back with ADLS.    Baseline  Neck 3/10 today, back still at previous level    Time  6    Period  Weeks    Status  Partially Met      PT LONG TERM GOAL #3   Title  Patient able to sleep for 5 hours without waking from pain.    Time  6    Period  Weeks    Status  Achieved      PT LONG TERM GOAL #4   Title  Pt will improve lumbar mobility by 25% in all directions to decrease pain and improve mobility    Time  6    Period  Weeks    Status  Partially Met            Plan - 03/24/17 1310    Clinical Impression Statement  Patient presented today with increased pain possibly due to stress and lack of sleep from shellfish reaction previous night. She responded very well to DN with ++twitch responses in L subocciptals, UT and bil cervical and R lumbar multifidi. Patient also did very well with IASTM which she likely would not have been able to tolerate previously in lumbar.    PT Treatment/Interventions  ADLs/Self Care Home Management;Electrical Stimulation;Moist Heat;Ultrasound;Therapeutic exercise;Balance training;Neuromuscular re-education;Patient/family education;Dry needling;Taping    PT Next Visit Plan  MD Note next visit;  Assess manual therapy and DN; Step ups and functional balance activities; Strengthening for upper back, hip ABDuctors and gluteals.. DN prn.       Patient will benefit from skilled therapeutic intervention in order to improve the following deficits and impairments:  Decreased activity  tolerance, Decreased strength, Pain,  Decreased balance, Decreased range of motion, Postural dysfunction  Visit Diagnosis: Chronic bilateral low back pain, with sciatica presence unspecified  Cervicalgia     Problem List Patient Active Problem List   Diagnosis Date Noted  . Abnormal endocrine laboratory test finding 07/20/2016  . Coronary artery disease involving native coronary artery of native heart with angina pectoris ([place])   . Frequency of urination 08/20/2012  . Hypokalemia 08/04/2012  . Anemia 08/04/2012  . HTN (hypertension) 05/21/2012  . Back pain, chronic 05/21/2012  . ADD (attention deficit disorder) 05/21/2012  . HLD (hyperlipidemia) 05/21/2012  . Depression 05/21/2012  . Insomnia 05/21/2012    Madelyn Flavors PT 03/24/2017, 1:14 PM  [facility] [place], [place], [address] Phone: [phone]   Fax:  [phone]  Name: Amy Merritt MRN: [id number] Date of Birth: 1960-04-14

## 2017-03-27 ENCOUNTER — Ambulatory Visit: Payer: 59 | Admitting: Physical Therapy

## 2017-03-28 ENCOUNTER — Ambulatory Visit: Payer: 59 | Admitting: Physical Therapy

## 2017-03-28 DIAGNOSIS — M542 Cervicalgia: Secondary | ICD-10-CM

## 2017-03-28 DIAGNOSIS — M545 Low back pain: Principal | ICD-10-CM

## 2017-03-28 DIAGNOSIS — G8929 Other chronic pain: Secondary | ICD-10-CM

## 2017-03-28 NOTE — Therapy (Signed)
[facility] [place], [place], [address] Phone: [phone]   Fax:  [phone]  Physical Therapy Treatment  Patient Details  Name: Amy Merritt MRN: [id number] Date of Birth: 1960/10/23 Referring Provider: Dr. Rennis Harding   Encounter Date: 03/28/2017  PT End of Session - 03/28/17 1608    Visit Number  10    Number of Visits  12    Date for PT Re-Evaluation  03/28/17    PT Start Time  0317    PT Stop Time  0354    PT Time Calculation (min)  37 min       Past Medical History:  Diagnosis Date  . Arthritis   . Chronic kidney disease   . Depression   . DJD (degenerative joint disease)   . DJD (degenerative joint disease) of cervical spine   . DJD (degenerative joint disease), lumbar   . Headache(784.0)   . Hypertension   . IBS (irritable bowel syndrome)   . Kidney stone   . Lymphocytic colitis   . Morgellons disease   . PONV (postoperative nausea and vomiting)     Past Surgical History:  Procedure Laterality Date  . CARDIAC CATHETERIZATION N/A 06/15/2015   Procedure: Left Heart Cath and Coronary Angiography;  Surgeon: Burnell Blanks, MD;  Location: [facility];  Service: Cardiovascular;  Laterality: N/A;  . CERVICAL FUSION     x 2  . COLONOSCOPY  02/11/2003  . DG MYLEOGRAM LUMBAR SPINE ([place] HX)    . lithotrpsy    . LUMBAR DISC SURGERY     x 4- lower back with rods and screws  . TUBAL LIGATION      There were no vitals filed for this visit.  Subjective Assessment - 03/28/17 1519    Subjective  I was worried that I was going to run out of pain meds so I laid on heating pad all weekend.  I blistered my back.  Feel like some beat me with a baseball bat.  Patient requested an easy treatment to her low back today.    Currently in Pain?  Yes    Pain Score  10-Worst pain ever    Pain Location  Back    Pain Descriptors / Indicators  Aching                      [facility] Adult PT  Treatment/Exercise - 03/28/17 0001      Modalities   Modalities  Ultrasound      Ultrasound   Ultrasound Location  Bilateral LB    Ultrasound Parameters  1.50 W/CM2 x 12 minutes with patient in left sdly position with pillow between knees for comfort.    Ultrasound Goals  Pain      Manual Therapy   Manual Therapy  Soft tissue mobilization    Soft tissue mobilization  STW/M and bilateral QL release technique x 14 minutes (away from area of blister).                  PT Long Term Goals - 03/20/17 1319      PT LONG TERM GOAL #1   Title  Pt will be independent with HEP    Time  6    Period  Weeks    Status  On-going      PT LONG TERM GOAL #2   Title  Patient to report decreased pain by 50% in neck and back with ADLS.  Baseline  Neck 3/10 today, back still at previous level    Time  6    Period  Weeks    Status  Partially Met      PT LONG TERM GOAL #3   Title  Patient able to sleep for 5 hours without waking from pain.    Time  6    Period  Weeks    Status  Achieved      PT LONG TERM GOAL #4   Title  Pt will improve lumbar mobility by 25% in all directions to decrease pain and improve mobility    Time  6    Period  Weeks    Status  Partially Met            Plan - 03/28/17 1603    Clinical Impression Statement  The patient's entire lower back was marbled/mottled due to hours on an electric heating pad.  She did have a small blistered area in the left lower back region.  Conservative treatment per patient request and no heat or electrical stimulation due to skin condition.  She reports she felt better after treatment.    PT Treatment/Interventions  ADLs/Self Care Home Management;Electrical Stimulation;Moist Heat;Ultrasound;Therapeutic exercise;Balance training;Neuromuscular re-education;Patient/family education;Dry needling;Taping    PT Next Visit Plan  MD Note next visit;  Assess manual therapy and DN; Step ups and functional balance activities;  Strengthening for upper back, hip ABDuctors and gluteals.. DN prn.    PT Home Exercise Plan  sit to stand, SLS, cervical and scapular retraction with tband, SLS    Consulted and Agree with Plan of Care  Patient       Patient will benefit from skilled therapeutic intervention in order to improve the following deficits and impairments:  Decreased activity tolerance, Decreased strength, Pain, Decreased balance, Decreased range of motion, Postural dysfunction  Visit Diagnosis: Chronic bilateral low back pain, with sciatica presence unspecified  Cervicalgia     Problem List Patient Active Problem List   Diagnosis Date Noted  . Abnormal endocrine laboratory test finding 07/20/2016  . Coronary artery disease involving native coronary artery of native heart with angina pectoris ([place])   . Frequency of urination 08/20/2012  . Hypokalemia 08/04/2012  . Anemia 08/04/2012  . HTN (hypertension) 05/21/2012  . Back pain, chronic 05/21/2012  . ADD (attention deficit disorder) 05/21/2012  . HLD (hyperlipidemia) 05/21/2012  . Depression 05/21/2012  . Insomnia 05/21/2012    Danajah Birdsell, [place] MPT 03/28/2017, 4:19 PM  [facility] [address] [place], [place], [address] Phone: [phone]   Fax:  [phone]  Name: Amy Merritt MRN: [id number] Date of Birth: May 30, 1960

## 2017-04-04 ENCOUNTER — Encounter: Payer: 59 | Admitting: Physical Therapy

## 2017-04-06 ENCOUNTER — Ambulatory Visit: Payer: 59 | Admitting: Physical Therapy

## 2017-04-06 ENCOUNTER — Encounter: Payer: Self-pay | Admitting: Physical Therapy

## 2017-04-06 DIAGNOSIS — G8929 Other chronic pain: Secondary | ICD-10-CM

## 2017-04-06 DIAGNOSIS — M542 Cervicalgia: Secondary | ICD-10-CM

## 2017-04-06 DIAGNOSIS — M545 Low back pain: Principal | ICD-10-CM

## 2017-04-06 NOTE — Therapy (Signed)
[facility] [place], [place], [address] Phone: [phone]   Fax:  [phone]  Physical Therapy Treatment  Patient Details  Name: Amy Merritt MRN: [id number] Date of Birth: 1961/01/13 Referring Provider: Dr. Rennis Harding   Encounter Date: 04/06/2017  PT End of Session - 04/06/17 1300    Visit Number  11    Number of Visits  12    Date for PT Re-Evaluation  03/28/17    PT Start Time  1030    PT Stop Time  1118    PT Time Calculation (min)  48 min    Activity Tolerance  Patient tolerated treatment well    Behavior During Therapy  [facility] for tasks assessed/performed       Past Medical History:  Diagnosis Date  . Arthritis   . Chronic kidney disease   . Depression   . DJD (degenerative joint disease)   . DJD (degenerative joint disease) of cervical spine   . DJD (degenerative joint disease), lumbar   . Headache(784.0)   . Hypertension   . IBS (irritable bowel syndrome)   . Kidney stone   . Lymphocytic colitis   . Morgellons disease   . PONV (postoperative nausea and vomiting)     Past Surgical History:  Procedure Laterality Date  . CARDIAC CATHETERIZATION N/A 06/15/2015   Procedure: Left Heart Cath and Coronary Angiography;  Surgeon: Burnell Blanks, MD;  Location: [facility];  Service: Cardiovascular;  Laterality: N/A;  . CERVICAL FUSION     x 2  . COLONOSCOPY  02/11/2003  . DG MYLEOGRAM LUMBAR SPINE ([place] HX)    . lithotrpsy    . LUMBAR DISC SURGERY     x 4- lower back with rods and screws  . TUBAL LIGATION      There were no vitals filed for this visit.  Subjective Assessment - 04/06/17 1301    Subjective  The patient's CC is that of right sided neck pain today.  She wants to focus on this today.    Pain Score  7     Pain Location  Neck    Pain Orientation  Right    Pain Descriptors / Indicators  Aching    Pain Type  Chronic pain    Pain Onset  More than a month ago                       [facility] Adult PT Treatment/Exercise - 04/06/17 0001      Modalities   Modalities  Electrical Stimulation;Ultrasound      Moist Heat Therapy   Number Minutes Moist Heat  10 Minutes    Moist Heat Location  -- Right cervical.      Electrical Stimulation   Electrical Stimulation Location  Right UT.    Electrical Stimulation Action  Pre-mod.    Electrical Stimulation Parameters  80-150 Hz on constant x 10 minutes.      Ultrasound   Ultrasound Location  Right UT    Ultrasound Parameters  1.50 W/CM2 x 12 minutes.      Manual Therapy   Manual Therapy  Soft tissue mobilization    Soft tissue mobilization  STW/M including IASTM and ischemic release technique (tota time:  12 minutes).                  PT Long Term Goals - 03/20/17 1319      PT LONG TERM GOAL #1  Title  Pt will be independent with HEP    Time  6    Period  Weeks    Status  On-going      PT LONG TERM GOAL #2   Title  Patient to report decreased pain by 50% in neck and back with ADLS.    Baseline  Neck 3/10 today, back still at previous level    Time  6    Period  Weeks    Status  Partially Met      PT LONG TERM GOAL #3   Title  Patient able to sleep for 5 hours without waking from pain.    Time  6    Period  Weeks    Status  Achieved      PT LONG TERM GOAL #4   Title  Pt will improve lumbar mobility by 25% in all directions to decrease pain and improve mobility    Time  6    Period  Weeks    Status  Partially Met            Plan - 04/06/17 1417    Clinical Impression Statement  Very good response to treament today with less pain reported after treatment.       Patient will benefit from skilled therapeutic intervention in order to improve the following deficits and impairments:     Visit Diagnosis: Chronic bilateral low back pain, with sciatica presence unspecified  Cervicalgia     Problem List Patient Active Problem List   Diagnosis Date Noted   . Abnormal endocrine laboratory test finding 07/20/2016  . Coronary artery disease involving native coronary artery of native heart with angina pectoris ([place])   . Frequency of urination 08/20/2012  . Hypokalemia 08/04/2012  . Anemia 08/04/2012  . HTN (hypertension) 05/21/2012  . Back pain, chronic 05/21/2012  . ADD (attention deficit disorder) 05/21/2012  . HLD (hyperlipidemia) 05/21/2012  . Depression 05/21/2012  . Insomnia 05/21/2012    Amy Merritt, [place] MPT 04/06/2017, 2:17 PM  [facility] [address] [place], [place], [address] Phone: [phone]   Fax:  [phone]  Name: Amy Merritt MRN: [id number] Date of Birth: February 10, 1960

## 2017-04-10 ENCOUNTER — Other Ambulatory Visit: Payer: Self-pay

## 2017-04-10 ENCOUNTER — Ambulatory Visit: Payer: 59 | Attending: Orthopaedic Surgery | Admitting: Physical Therapy

## 2017-04-10 DIAGNOSIS — M545 Low back pain: Secondary | ICD-10-CM | POA: Insufficient documentation

## 2017-04-10 DIAGNOSIS — G8929 Other chronic pain: Secondary | ICD-10-CM | POA: Insufficient documentation

## 2017-04-10 DIAGNOSIS — M542 Cervicalgia: Secondary | ICD-10-CM | POA: Diagnosis present

## 2017-04-10 NOTE — Therapy (Addendum)
[facility] [place], [place], [address] Phone: [phone]   Fax:  [phone]  Physical Therapy Treatment  Patient Details  Name: Amy Merritt MRN: [id number] Date of Birth: 09-Mar-1960 Referring Provider: Dr. Rennis Harding   Encounter Date: 04/10/2017  PT End of Session - 04/10/17 1437    Visit Number  12    Number of Visits  12    Date for PT Re-Evaluation  03/28/17    PT Start Time  8921    PT Stop Time  1528    PT Time Calculation (min)  51 min    Activity Tolerance  Patient tolerated treatment well    Behavior During Therapy  [facility] for tasks assessed/performed       Past Medical History:  Diagnosis Date  . Arthritis   . Chronic kidney disease   . Depression   . DJD (degenerative joint disease)   . DJD (degenerative joint disease) of cervical spine   . DJD (degenerative joint disease), lumbar   . Headache(784.0)   . Hypertension   . IBS (irritable bowel syndrome)   . Kidney stone   . Lymphocytic colitis   . Morgellons disease   . PONV (postoperative nausea and vomiting)     Past Surgical History:  Procedure Laterality Date  . CARDIAC CATHETERIZATION N/A 06/15/2015   Procedure: Left Heart Cath and Coronary Angiography;  Surgeon: Burnell Blanks, MD;  Location: [facility];  Service: Cardiovascular;  Laterality: N/A;  . CERVICAL FUSION     x 2  . COLONOSCOPY  02/11/2003  . DG MYLEOGRAM LUMBAR SPINE ([place] HX)    . lithotrpsy    . LUMBAR DISC SURGERY     x 4- lower back with rods and screws  . TUBAL LIGATION      There were no vitals filed for this visit.  Subjective Assessment - 04/10/17 1438    Subjective  My back is killing me. She reports that her surgeon wanted her to finish out this round of PT, but put her on a round of Prednisone.    Pertinent History  cervical fusions x 2, lumbar surgeries x 4 (fusions then screws/rods into Tspine), chronic kidney disease, coronary atherosclerosis,  Morgollans disease, arachnoid web T spine    Patient Stated Goals  to reduce pain     Currently in Pain?  Yes    Pain Score  7     Pain Location  Back    Pain Orientation  Lower    Pain Descriptors / Indicators  Aching    Pain Type  Chronic pain    Pain Score  4    Pain Location  Neck    Pain Orientation  Right                      [facility] Adult PT Treatment/Exercise - 04/10/17 0001      Modalities   Modalities  Electrical Stimulation;Moist Heat      Moist Heat Therapy   Number Minutes Moist Heat  15 Minutes    Moist Heat Location  Lumbar Spine;Other (comment) thoracic      Electrical Stimulation   Electrical Stimulation Location  thoracic/lumbar/gluteals paraspinals    Electrical Stimulation Action  IFC    Electrical Stimulation Parameters  80-150 Hz x 15 min    Electrical Stimulation Goals  Pain      Manual Therapy   Manual Therapy  Soft tissue mobilization;Myofascial release  Soft tissue mobilization  to bil thoracic/ lumbar paraspinals and gluteals    Myofascial Release  TPR to Bil gluteals                  PT Long Term Goals - 04/10/17 1441      PT LONG TERM GOAL #1   Title  Pt will be independent with HEP    Time  6    Period  Weeks    Status  Achieved      PT LONG TERM GOAL #2   Title  Patient to report decreased pain by 50% in neck and back with ADLS.    Baseline  patient reports 30-40% improvement, back still at previous level    Period  Weeks    Status  Not Met      PT LONG TERM GOAL #3   Title  Patient able to sleep for 5 hours without waking from pain.    Baseline  Patient reports her fit bit shows she is not sleeping well at all.     Time  6    Period  Weeks    Status  Not Met      PT LONG TERM GOAL #4   Title  Pt will improve lumbar mobility by 25% in all directions to decrease pain and improve mobility    Baseline  Bil rotation WFL Bil, Able to flex to pick up item from floor while lunging, SB still limited    Time  6     Period  Weeks    Status  Partially Met            Plan - 04/10/17 1520    Clinical Impression Statement   Patient presents today with increased back pain. Her neck is 30-40% better overall, however her back is at the same level.  She states that she felt the DN would help the back for 3-4 days, but then the pain would return. She reports that her balance is significantly better and that she now has exercises she can do at home which continue to help.  She had some improvements with sleep during treatment and had met her goal at onetime, but presently her sleep is still very affected by pain.     Rehab Potential  Good    Clinical Impairments Affecting Rehab Potential  see above    PT Frequency  2x / week    PT Duration  6 weeks    PT Treatment/Interventions  ADLs/Self Care Home Management;Electrical Stimulation;Moist Heat;Ultrasound;Therapeutic exercise;Balance training;Neuromuscular re-education;Patient/family education;Dry needling;Taping    PT Next Visit Plan  D/C to HEP.    PT Home Exercise Plan  sit to stand, SLS, cervical and scapular retraction with tband, SLS    Consulted and Agree with Plan of Care  Patient       Patient will benefit from skilled therapeutic intervention in order to improve the following deficits and impairments:  Decreased activity tolerance, Decreased strength, Pain, Decreased balance, Decreased range of motion, Postural dysfunction  Visit Diagnosis: Chronic bilateral low back pain, with sciatica presence unspecified  Cervicalgia     Problem List Patient Active Problem List   Diagnosis Date Noted  . Abnormal endocrine laboratory test finding 07/20/2016  . Coronary artery disease involving native coronary artery of native heart with angina pectoris ([place])   . Frequency of urination 08/20/2012  . Hypokalemia 08/04/2012  . Anemia 08/04/2012  . HTN (hypertension) 05/21/2012  . Back pain, chronic 05/21/2012  .  ADD (attention deficit disorder)  05/21/2012  . HLD (hyperlipidemia) 05/21/2012  . Depression 05/21/2012  . Insomnia 05/21/2012    Madelyn Flavors PT 04/10/2017, 3:34 PM  [facility] [address] [place], [place], [address] Phone: [phone]   Fax:  [phone]  Name: JATARA HUETTNER MRN: [id number] Date of Birth: [birth year]  PHYSICAL THERAPY DISCHARGE SUMMARY  Visits from Start of Care: 12  Current functional level related to goals / functional outcomes: See above   Remaining deficits: See above   Education / Equipment: HEP Plan: Patient agrees to discharge.  Patient goals were partially met. Patient is being discharged due to lack of progress.  ?????     Madelyn Flavors, PT 04/10/17 3:34 PM [facility] [place], [place], [address] Phone: [phone]   Fax:  [phone]

## 2017-04-11 ENCOUNTER — Encounter: Payer: 59 | Admitting: Physical Therapy

## 2017-05-20 ENCOUNTER — Other Ambulatory Visit: Payer: Self-pay | Admitting: Family Medicine

## 2017-05-22 NOTE — Telephone Encounter (Signed)
Last lipid 10/28/15

## 2017-05-29 ENCOUNTER — Ambulatory Visit: Payer: 59 | Admitting: Family Medicine

## 2017-05-29 ENCOUNTER — Telehealth: Payer: Self-pay | Admitting: Family Medicine

## 2017-05-29 ENCOUNTER — Encounter: Payer: Self-pay | Admitting: Family Medicine

## 2017-05-29 ENCOUNTER — Ambulatory Visit (INDEPENDENT_AMBULATORY_CARE_PROVIDER_SITE_OTHER): Payer: 59

## 2017-05-29 VITALS — BP 94/68 | HR 82 | Temp 97.0°F | Ht 61.0 in | Wt 165.0 lb

## 2017-05-29 DIAGNOSIS — J181 Lobar pneumonia, unspecified organism: Secondary | ICD-10-CM | POA: Diagnosis not present

## 2017-05-29 DIAGNOSIS — R062 Wheezing: Secondary | ICD-10-CM

## 2017-05-29 DIAGNOSIS — J189 Pneumonia, unspecified organism: Secondary | ICD-10-CM

## 2017-05-29 MED ORDER — LEVOFLOXACIN 750 MG PO TABS: 750.0000 mg | ORAL_TABLET | Freq: Every day | ORAL | 0 refills | Status: AC

## 2017-05-29 MED ORDER — PREDNISONE 20 MG PO TABS: ORAL_TABLET | ORAL | 0 refills | Status: DC

## 2017-05-29 MED ORDER — ALBUTEROL SULFATE HFA 108 (90 BASE) MCG/ACT IN AERS: INHALATION_SPRAY | RESPIRATORY_TRACT | 0 refills | Status: DC

## 2017-05-29 MED ORDER — NITROGLYCERIN 0.4 MG SL SUBL: 0.4000 mg | SUBLINGUAL_TABLET | SUBLINGUAL | 3 refills | Status: AC | PRN

## 2017-05-29 MED ORDER — AZITHROMYCIN 250 MG PO TABS: ORAL_TABLET | ORAL | 0 refills | Status: DC

## 2017-05-29 NOTE — Progress Notes (Signed)
   HPI  Patient presents today here with cough.  Patient explains she has had wheezing for about 3 weeks.  She also states that she has had dizziness now for 3 days or so.  She has an unsteadiness type dizziness.  She also complains of cough that is been there for 1 day.  PMH: Smoking status noted ROS: Per HPI  Objective: BP 94/68   Pulse 82   Temp (!) 97 F (36.1 C) (Oral)   Ht 5\' 1"  (1.549 m)   Wt 165 lb (74.8 kg)   SpO2 90%   BMI 31.18 kg/m  Gen: NAD, alert, cooperative with exam HEENT: NCAT, eyes closed throughout exam, opens to talk and then closes eyes again CV: RRR, good S1/S2, no murmur Resp: crackles/rhonchi throughout, non labored, loud upper airway noises, O2 88-91% thourghout exam Abd: SNTND, BS present, no guarding or organomegaly Ext: No edema, warm Neuro: Alert and oriented, No gross deficits  Assessment and plan:  # Cough Concerned about CAP, Crackles in R base are prominent Azithro, prednisone O2 not dipping below 88%, CXR pending.  Somnolence I am concerned this is due to pain medication. Pt is unclear in her Hx - stated last visit that took narcotic + benzo prior to PT. No admission this visit  Refilled Nitro, Pt denies CP, had some last night that went away with rest which is what she describes as typical for her.    CXR C/w BL CAP Escalate Tx to :Levaquin, discussed with patient and her son- seek attention in ER with any worsening.  Cancel prednisone and azithro Continue albuterol as needed   Orders Placed This Encounter  Procedures  . DG Chest 2 View    Standing Status:   Future    Standing Expiration Date:   07/29/2018    Order Specific Question:   Reason for Exam (SYMPTOM  OR DIAGNOSIS REQUIRED)    Answer:   wheezing    Order Specific Question:   Is the patient pregnant?    Answer:   No    Order Specific Question:   Preferred imaging location?    Answer:   Internal    Meds ordered this encounter  Medications  . nitroGLYCERIN  (NITROSTAT) 0.4 MG SL tablet    Sig: Place 1 tablet (0.4 mg total) under the tongue every 5 (five) minutes as needed for chest pain.    Dispense:  25 tablet    Refill:  3    Murtis SinkSam Mayvis Agudelo, MD [facility] 05/29/2017, 3:42 PM

## 2017-05-29 NOTE — Telephone Encounter (Signed)
inhaler sent in  Murtis SinkSam Bradshaw, MD [facility] 05/29/2017, 5:02 PM

## 2017-05-30 NOTE — Telephone Encounter (Signed)
Patient aware that inhalers has been sent to pharmacy

## 2017-06-01 ENCOUNTER — Ambulatory Visit: Payer: 59 | Admitting: Family Medicine

## 2017-06-21 ENCOUNTER — Other Ambulatory Visit: Payer: Self-pay | Admitting: Family Medicine

## 2017-07-08 DEATH — deceased

## 2017-08-17 ENCOUNTER — Ambulatory Visit: Payer: 59 | Admitting: "Endocrinology
